# Patient Record
Sex: Female | Born: 1945 | Race: White | Hispanic: No | Marital: Married | Smoking: Never smoker
Health system: Southern US, Community
[De-identification: ages and names within clinical notes are randomized; demographics above are authoritative.]

## PROBLEM LIST (undated history)

## (undated) DIAGNOSIS — E039 Hypothyroidism, unspecified: Secondary | ICD-10-CM

## (undated) DIAGNOSIS — E785 Hyperlipidemia, unspecified: Secondary | ICD-10-CM

## (undated) DIAGNOSIS — Z860100 Personal history of colon polyps, unspecified: Secondary | ICD-10-CM

## (undated) DIAGNOSIS — G2581 Restless legs syndrome: Secondary | ICD-10-CM

## (undated) DIAGNOSIS — M1990 Unspecified osteoarthritis, unspecified site: Secondary | ICD-10-CM

## (undated) DIAGNOSIS — C541 Malignant neoplasm of endometrium: Secondary | ICD-10-CM

## (undated) DIAGNOSIS — J302 Other seasonal allergic rhinitis: Secondary | ICD-10-CM

## (undated) DIAGNOSIS — J189 Pneumonia, unspecified organism: Secondary | ICD-10-CM

## (undated) DIAGNOSIS — R42 Dizziness and giddiness: Secondary | ICD-10-CM

## (undated) DIAGNOSIS — G4733 Obstructive sleep apnea (adult) (pediatric): Secondary | ICD-10-CM

## (undated) DIAGNOSIS — K219 Gastro-esophageal reflux disease without esophagitis: Secondary | ICD-10-CM

## (undated) DIAGNOSIS — IMO0002 Reserved for concepts with insufficient information to code with codable children: Secondary | ICD-10-CM

## (undated) DIAGNOSIS — M549 Dorsalgia, unspecified: Secondary | ICD-10-CM

## (undated) DIAGNOSIS — M329 Systemic lupus erythematosus, unspecified: Secondary | ICD-10-CM

## (undated) DIAGNOSIS — K567 Ileus, unspecified: Secondary | ICD-10-CM

## (undated) DIAGNOSIS — R112 Nausea with vomiting, unspecified: Secondary | ICD-10-CM

## (undated) DIAGNOSIS — Z8619 Personal history of other infectious and parasitic diseases: Secondary | ICD-10-CM

## (undated) DIAGNOSIS — M2550 Pain in unspecified joint: Secondary | ICD-10-CM

## (undated) DIAGNOSIS — Z9889 Other specified postprocedural states: Secondary | ICD-10-CM

## (undated) DIAGNOSIS — M2540 Effusion, unspecified joint: Secondary | ICD-10-CM

## (undated) DIAGNOSIS — R51 Headache: Secondary | ICD-10-CM

## (undated) DIAGNOSIS — R351 Nocturia: Secondary | ICD-10-CM

## (undated) HISTORY — DX: Malignant neoplasm of endometrium: C54.1

## (undated) HISTORY — PX: DILATION AND CURETTAGE OF UTERUS: SHX78

## (undated) HISTORY — PX: OTHER SURGICAL HISTORY: SHX169

## (undated) HISTORY — PX: KNEE ARTHROPLASTY: SHX992

## (undated) HISTORY — PX: COLON SURGERY: SHX602

## (undated) HISTORY — PX: COLONOSCOPY: SHX174

## (undated) HISTORY — PX: BRAIN SURGERY: SHX531

## (undated) HISTORY — DX: Obstructive sleep apnea (adult) (pediatric): G47.33

## (undated) HISTORY — PX: ABDOMINAL HYSTERECTOMY: SHX81

---

## 1975-03-29 ENCOUNTER — Encounter: Payer: Self-pay | Admitting: Gastroenterology

## 1991-10-27 ENCOUNTER — Encounter: Payer: Self-pay | Admitting: Gastroenterology

## 1991-11-03 ENCOUNTER — Encounter: Payer: Self-pay | Admitting: Gastroenterology

## 1993-04-01 ENCOUNTER — Encounter: Payer: Self-pay | Admitting: Gastroenterology

## 1994-11-04 ENCOUNTER — Encounter: Payer: Self-pay | Admitting: Gastroenterology

## 1994-11-19 ENCOUNTER — Encounter: Payer: Self-pay | Admitting: Gastroenterology

## 2002-09-12 ENCOUNTER — Encounter (INDEPENDENT_AMBULATORY_CARE_PROVIDER_SITE_OTHER): Payer: Self-pay | Admitting: *Deleted

## 2002-09-12 ENCOUNTER — Ambulatory Visit (HOSPITAL_COMMUNITY): Admission: RE | Admit: 2002-09-12 | Discharge: 2002-09-12 | Payer: Self-pay | Admitting: Gastroenterology

## 2002-10-19 ENCOUNTER — Other Ambulatory Visit: Admission: RE | Admit: 2002-10-19 | Discharge: 2002-10-19 | Payer: Self-pay | Admitting: Obstetrics and Gynecology

## 2002-12-04 ENCOUNTER — Encounter: Payer: Self-pay | Admitting: Obstetrics and Gynecology

## 2002-12-04 ENCOUNTER — Ambulatory Visit (HOSPITAL_COMMUNITY): Admission: RE | Admit: 2002-12-04 | Discharge: 2002-12-04 | Payer: Self-pay | Admitting: Obstetrics and Gynecology

## 2003-10-19 ENCOUNTER — Ambulatory Visit (HOSPITAL_COMMUNITY): Admission: RE | Admit: 2003-10-19 | Discharge: 2003-10-19 | Payer: Self-pay | Admitting: Orthopedic Surgery

## 2003-10-19 ENCOUNTER — Ambulatory Visit (HOSPITAL_BASED_OUTPATIENT_CLINIC_OR_DEPARTMENT_OTHER): Admission: RE | Admit: 2003-10-19 | Discharge: 2003-10-19 | Payer: Self-pay | Admitting: Orthopedic Surgery

## 2003-10-23 ENCOUNTER — Other Ambulatory Visit: Admission: RE | Admit: 2003-10-23 | Discharge: 2003-10-23 | Payer: Self-pay | Admitting: Obstetrics and Gynecology

## 2003-12-01 HISTORY — PX: MEDIAL PARTIAL KNEE REPLACEMENT: SHX5965

## 2004-10-06 ENCOUNTER — Ambulatory Visit (HOSPITAL_COMMUNITY): Admission: RE | Admit: 2004-10-06 | Discharge: 2004-10-06 | Payer: Self-pay | Admitting: Obstetrics and Gynecology

## 2004-10-28 ENCOUNTER — Other Ambulatory Visit: Admission: RE | Admit: 2004-10-28 | Discharge: 2004-10-28 | Payer: Self-pay | Admitting: *Deleted

## 2004-11-30 HISTORY — PX: HAND SURGERY: SHX662

## 2005-12-14 ENCOUNTER — Encounter (INDEPENDENT_AMBULATORY_CARE_PROVIDER_SITE_OTHER): Payer: Self-pay | Admitting: *Deleted

## 2005-12-14 ENCOUNTER — Ambulatory Visit (HOSPITAL_BASED_OUTPATIENT_CLINIC_OR_DEPARTMENT_OTHER): Admission: RE | Admit: 2005-12-14 | Discharge: 2005-12-14 | Payer: Self-pay | Admitting: *Deleted

## 2007-04-27 ENCOUNTER — Encounter: Payer: Self-pay | Admitting: Gastroenterology

## 2007-11-25 ENCOUNTER — Ambulatory Visit (HOSPITAL_COMMUNITY): Admission: RE | Admit: 2007-11-25 | Discharge: 2007-11-25 | Payer: Self-pay | Admitting: Obstetrics and Gynecology

## 2008-12-18 ENCOUNTER — Ambulatory Visit (HOSPITAL_COMMUNITY): Admission: RE | Admit: 2008-12-18 | Discharge: 2008-12-18 | Payer: Self-pay | Admitting: Obstetrics and Gynecology

## 2010-02-05 ENCOUNTER — Encounter: Payer: Self-pay | Admitting: Gastroenterology

## 2010-02-17 ENCOUNTER — Encounter (INDEPENDENT_AMBULATORY_CARE_PROVIDER_SITE_OTHER): Payer: Self-pay | Admitting: *Deleted

## 2010-03-19 ENCOUNTER — Ambulatory Visit: Payer: Self-pay | Admitting: Gastroenterology

## 2010-03-19 DIAGNOSIS — G43909 Migraine, unspecified, not intractable, without status migrainosus: Secondary | ICD-10-CM | POA: Insufficient documentation

## 2010-03-19 DIAGNOSIS — K219 Gastro-esophageal reflux disease without esophagitis: Secondary | ICD-10-CM | POA: Insufficient documentation

## 2010-03-19 DIAGNOSIS — R159 Full incontinence of feces: Secondary | ICD-10-CM | POA: Insufficient documentation

## 2010-04-23 ENCOUNTER — Ambulatory Visit: Payer: Self-pay | Admitting: Gastroenterology

## 2010-04-30 ENCOUNTER — Ambulatory Visit (HOSPITAL_COMMUNITY): Admission: RE | Admit: 2010-04-30 | Discharge: 2010-04-30 | Payer: Self-pay | Admitting: Gastroenterology

## 2010-04-30 ENCOUNTER — Ambulatory Visit: Payer: Self-pay | Admitting: Gastroenterology

## 2010-05-02 ENCOUNTER — Telehealth: Payer: Self-pay | Admitting: Gastroenterology

## 2010-06-25 ENCOUNTER — Ambulatory Visit: Payer: Self-pay | Admitting: Gastroenterology

## 2010-09-11 ENCOUNTER — Ambulatory Visit: Payer: Self-pay | Admitting: Gastroenterology

## 2010-09-11 DIAGNOSIS — K648 Other hemorrhoids: Secondary | ICD-10-CM | POA: Insufficient documentation

## 2010-12-30 NOTE — Procedures (Signed)
Summary: Prep/[facility]  Prep/[facility]   Imported By: Lester Harrison 04/30/2010 09:43:44  _____________________________________________________________________  External Attachment:    Type:   Image     Comment:   External Document

## 2010-12-30 NOTE — Letter (Signed)
Summary: [facility]  [address] [place], [place] [address]   Phone: [phone]  Fax: [phone]       MALONIE TATUM    1946-05-26    MRN: [id number]        Procedure Day /Date: 04/30/10 Wednesday     Arrival Time: 7:45 am     Procedure Time: 9:00 am     Location of Procedure:                     _x [facility] ( Outpatient Registration)  PREPARATION FOR FLEXIBLE SIGMOIDOSCOPY WITH MAGNESIUM CITRATE  Prior to the day before your procedure, purchase one 8 oz. bottle of Magnesium Citrate and one Fleet Enema from the laxative section of your drugstore.  _________________________________________________________________________________________________  THE DAY BEFORE YOUR PROCEDURE             DATE: 04/29/10     DAY: Tuesday  1.   Have a clear liquid dinner the night before your procedure.  2.   Do not drink anything colored red or purple.  Avoid juices with pulp.  No orange juice.              CLEAR LIQUIDS INCLUDE: Water Jello Ice Popsicles Tea (sugar ok, no milk/cream) Powdered fruit flavored drinks Coffee (sugar ok, no milk/cream) Gatorade Juice: apple, white grape, white cranberry  Lemonade Clear bullion, consomm, broth Carbonated beverages (any kind) Strained chicken noodle soup Hard Candy   3.   At 7:00 pm the night before your procedure, drink one bottle of Magnesium Citrate over ice.  4.   Drink at least 3 more glasses of clear liquids before bedtime (preferably juices).  5.   Results are expected usually within 1 to 6 hours after taking the Magnesium Citrate.  ___________________________________________________________________________________________________  THE DAY OF YOUR PROCEDURE            DATE: 04/30/10     DAY: Wednesday  1.   Use Fleet Enema one hour prior to coming for procedure.  2.   You may drink clear liquids until 7:00 am (2 hours before exam)       MEDICATION  INSTRUCTIONS  Unless otherwise instructed, you should take regular prescription medications with a small sip of water as early as possible the morning of your procedure.        OTHER INSTRUCTIONS  You will need a responsible adult at least 63 years of age to accompany you and drive you home.   This person must remain in the waiting room during your procedure.  Wear loose fitting clothing that is easily removed.  Leave jewelry and other valuables at home.  However, you may wish to bring a book to read or an iPod/MP3 player to listen to music as you wait for your procedure to start.  Remove all body piercing jewelry and leave at home.  Total time from sign-in until discharge is approximately 2-3 hours.  You should go home directly after your procedure and rest.  You can resume normal activities the day after your procedure.  The day of your procedure you should not:   Drive   Make legal decisions   Operate machinery   Drink alcohol   Return to work  You will receive specific instructions about eating, activities and medications before you leave.   The above instructions have been reviewed and explained to me by  Lamona Curl CMA (AAMA)  Apr 23, 2010 10:01 AM     I fully understand and can verbalize these instructions _____________________________ Date 04/23/10

## 2010-12-30 NOTE — Assessment & Plan Note (Signed)
Summary: ? IBS--ch.   History of Present Illness Visit Type: Initial Visit Primary GI MD: Melvia Heaps MD [facility] Chief Complaint: Rectal Mass,and reflux issues History of Present Illness:   Brooke Williams is a pleasant 65 year old white female complaining of rectal discharge.  She tends to have loose stools.  She frequently has discharge of mucus for which he has to wear protective underclothing.  She denies rectal pain, per se.  There is no history of bleeding.  Colonoscopy in 2003 demonstrated internal hemorrhoids.  Family history is pertinent for her father who had polyps and Crohn's disease   GI Review of Systems    Reports acid reflux, bloating, heartburn, and  nausea.      Denies abdominal pain, belching, chest pain, dysphagia with liquids, dysphagia with solids, loss of appetite, vomiting, vomiting blood, weight loss, and  weight gain.      Reports diarrhea, fecal incontinence, and  irritable bowel syndrome.     Denies anal fissure, black tarry stools, change in bowel habit, constipation, diverticulosis, heme positive stool, hemorrhoids, jaundice, light color stool, liver problems, rectal bleeding, and  rectal pain. Preventive Screening-Counseling & Management      Drug Use:  no.      Current Medications (verified): 1)  Estradiol 1 Mg Tabs (Estradiol) .... Once Daily 2)  Prometrium 100 Mg Caps (Progesterone Micronized) .... Once Daily 3)  Robaxin 500 Mg Tabs (Methocarbamol) .... As Needed For Pain 4)  Naproxen 500 Mg Tabs (Naproxen) .... Two Times A Day 5)  Omeprazole 20 Mg Cpdr (Omeprazole) .... Once Daily 6)  Allegra-D 12 Hour 60-120 Mg Xr12h-Tab (Fexofenadine-Pseudoephedrine) .... As Needed 7)  Calcium-Vitamin D3 600-200 Mg-Unit Tabs (Calcium Carbonate-Vitamin D) .... Once Daily 8)  Multivitamins  Tabs (Multiple Vitamin) .... Once Daily  Allergies (verified): 1)  ! Penicillin  Past History:  Past Medical History: Arthritis Chronic Headaches LUPUS 1995  Past  Surgical History: Left knee replacement 2005  Family History: Family History of Colon Polyps:Father Family History of Colitis/Crohn's:Father Family History of Irritable Bowel Syndrome:Father  Social History: Married No Children Retired Never smoked Alcohol Use - yes  social Daily Caffeine Use Illicit Drug Use - no Drug Use:  no  Review of Systems       Brooke Williams complains of allergy/sinus, arthritis/joint pain, headaches-new, hearing problems, skin rash, and sore throat.  Brooke Williams denies anemia, anxiety-new, back pain, blood in urine, breast changes/lumps, change in vision, confusion, cough, coughing up blood, depression-new, fainting, fatigue, fever, heart murmur, heart rhythm changes, itching, menstrual pain, muscle pains/cramps, night sweats, nosebleeds, pregnancy symptoms, shortness of breath, sleeping problems, swelling of feet/legs, swollen lymph glands, thirst - excessive , urination - excessive , urination changes/pain, urine leakage, vision changes, and voice change.         All other systems were reviewed and were negative   Vital Signs:  Williams profile:   65 year old female Height:      67 inches Weight:      178 pounds BMI:     27.98 Pulse rate:   88 / minute Pulse rhythm:   regular BP sitting:   112 / 68  (left arm) Cuff size:   regular  Vitals Entered By: Ok Anis CMA (March 19, 2010 10:55 AM)  Physical Exam  Additional Exam:  On physical exam she is a well-developed lethargic female  skin: anicter  HEENT: normocephalic; PEERLA; no nasal or orpharyngeal abnormalities neck: supple nodes: no cervical adenopathy chest: clear cor:  no murmurs,  gallops or rubs abd:  bowel sounds normoactive; no abdominal masses, tenderness, organomegaly rectal: no masses; stool heme negative ext: no cyanosis, clubbing, or edema skeletal: no gross skeletal abnormalities neuro: alert, oriented x 3; no focal abnormalities    Impression &  Recommendations:  Problem # 1:  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  Proctitis is also consideration.  Recommendations #1 trial of Anusol HC suppositories; if not improved I will proceed with a sigmoidoscopy #2 fiber supplementation  Problem # 2:  ESOPHAGEAL REFLUX (ICD-530.81) Symptoms are well-controlled with omeprazole  Problem # 3:  MIGRAINE HEADACHE (ICD-346.90) Assessment: Comment Only  Williams Instructions: 1)  You can pick up your new rx from your pharmacy today 2)  Please call for a return office appointment in 2-3 weeks 3)  Brooke medication list was reviewed and reconciled.  All changed / newly prescribed medications were explained.  A complete medication list was provided to Brooke Williams / caregiver. Prescriptions: ANUSOL-HC 25 MG SUPP (HYDROCORTISONE ACETATE) take one suppository q.h.s.  #14 x 1   Entered and Authorized by:   Louis Meckel MD   Signed by:   Louis Meckel MD on 03/19/2010   Method used:   [facility]  [place] [address]       [address]       [place], [place]  [address]       Ph: [phone] or [phone]       Fax: [phone]   RxID:   [id number]

## 2010-12-30 NOTE — Letter (Signed)
Summary: Results Letter  [facility]  [address] [place], [place] [address]   Phone: [phone]  Fax: [phone]        March 19, 2010 MRN: [id number]    [facility] [address] [place], [place]  [address]    Dear [name],  It is my pleasure to have treated you recently as a new patient in my office. I appreciate your confidence and the opportunity to participate in your care.  Since I do have a busy inpatient endoscopy schedule and office schedule, my office hours vary weekly. I am, however, available for emergency calls everyday through my office. If I am not available for an urgent office appointment, another one of our gastroenterologist will be able to assist you.  My well-trained staff are prepared to help you at all times. For emergencies after office hours, a physician from our Gastroenterology section is always available through my 24 hour answering service  Once again I welcome you as a new patient and I look forward to a happy and healthy relationship             Sincerely,  Louis Meckel MD  This letter has been electronically signed by your physician.  Appended Document: Results Letter letter mailed

## 2010-12-30 NOTE — Assessment & Plan Note (Signed)
Summary: f/u from procedure--ch.   History of Present Illness Visit Type: Follow-up Visit Primary GI MD: Melvia Heaps MD [facility] Primary Provider: Marjory Lies, MD  Requesting Provider: na Chief Complaint: F/u from flex sig.  Pt c/o still having hemorrhoids  History of Present Illness:   [name] has returned for follow up of her stool incontinence.  She is actually incontinent of passage of mucus.  She is not incontinent of stool.  Band ligation of her hemorrhoids did not improve her symptoms.   She is unaware of passing mucus.  She wears a protective undergarment.  She claims her symptoms began when she was diagnosed with lupus in 1995.  Metamucil has helped.   GI Review of Systems      Denies abdominal pain, acid reflux, belching, bloating, chest pain, dysphagia with liquids, dysphagia with solids, heartburn, loss of appetite, nausea, vomiting, vomiting blood, weight loss, and  weight gain.      Reports hemorrhoids.     Denies anal fissure, black tarry stools, change in bowel habit, constipation, diarrhea, diverticulosis, fecal incontinence, heme positive stool, irritable bowel syndrome, jaundice, light color stool, liver problems, rectal bleeding, and  rectal pain.    Current Medications (verified): 1)  Estradiol 1 Mg Tabs (Estradiol) .... Once Daily 2)  Prometrium 100 Mg Caps (Progesterone Micronized) .... Once Daily 3)  Omeprazole 20 Mg Cpdr (Omeprazole) .... Once Daily 4)  Allegra-D 12 Hour 60-120 Mg Xr12h-Tab (Fexofenadine-Pseudoephedrine) .... As Needed 5)  Calcium-Vitamin D3 600-200 Mg-Unit Tabs (Calcium Carbonate-Vitamin D) .... Once Daily 6)  Multivitamins  Tabs (Multiple Vitamin) .... Once Daily  Allergies (verified): 1)  ! Penicillin  Past History:  Past Medical History: Reviewed history from 03/19/2010 and no changes required. Arthritis Chronic Headaches LUPUS 1995  Past Surgical History: Reviewed history from 03/19/2010 and no changes required. Left  knee replacement 2005  Family History: Reviewed history from 04/23/2010 and no changes required. Family History of Colon Polyps:Father Family History of Colitis/Crohn's:Father Family History of Irritable Bowel Syndrome:Father No FH of Colon Cancer:  Social History: Reviewed history from 03/19/2010 and no changes required. Married No Children Retired Never smoked Alcohol Use - yes  social Daily Caffeine Use Illicit Drug Use - no  Review of Systems  The patient denies allergy/sinus, anemia, anxiety-new, arthritis/joint pain, back pain, blood in urine, breast changes/lumps, change in vision, confusion, cough, coughing up blood, depression-new, fainting, fatigue, fever, headaches-new, hearing problems, heart murmur, heart rhythm changes, itching, menstrual pain, muscle pains/cramps, night sweats, nosebleeds, pregnancy symptoms, shortness of breath, skin rash, sleeping problems, sore throat, swelling of feet/legs, swollen lymph glands, thirst - excessive , urination - excessive , urination changes/pain, urine leakage, vision changes, and voice change.    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  Symptoms could be 2 to a neuropathy.  Colitis affecting the more proximal bowel is also a concern.  Recommendations #1 continue fiber supplementation #2 trial of anticholinergics (hyomax) #3  colonoscopy if symptoms do not improve the above measures #4 to consider anal manometry  Problem # 2:  SLE (ICD-710.0) Assessment: Comment Only  Patient  Instructions: 1)  Copy sent to : Marjory Lies, MD  2)  Call back next week 3)  The medication list was reviewed and reconciled.  All changed / newly prescribed medications were explained.  A complete medication list was provided to the patient / caregiver. Prescriptions: HYOMAX-SR 0.375 MG XR12H-TAB (HYOSCYAMINE SULFATE) take one tab twice a day  #25 x 2   Entered and Authorized by:   Louis Meckel MD   Signed by:   Louis Meckel MD on 06/25/2010   Method used:   [facility]  [place] [address] [facility]       [address]       [place], [place]  [address]       Ph: [phone] or [phone]       Fax: [phone]   RxID:   [id number]

## 2010-12-30 NOTE — Letter (Signed)
Summary: New Patient letter  [facility]  [address] [place], [place] [address]   Phone: [phone]  Fax: [phone]       02/17/2010 MRN: [id number]  Rolene Nyce [address] [place], [place]  [address]  Dear Ms. Westley Foots,  Welcome to the Gastroenterology Division at [facility].    You are scheduled to see Dr. Arlyce Dice on 03-19-10 at 10:45a.m.  on the 3rd floor at [facility], [address]  We ask that you try to arrive at our office 15 minutes prior to your appointment time to allow for check-in.  We would like you to complete the enclosed self-administered evaluation form prior to your visit and bring it with you on the day of your appointment.  We will review it with you.  Also, please bring a complete list of all your medications or, if you prefer, bring the medication bottles and we will list them.  Please bring your insurance card so that we may make a copy of it.  If your insurance requires a referral to see a specialist, please bring your referral form from your primary care physician.  Co-payments are due at the time of your visit and may be paid by cash, check or credit card.     Your office visit will consist of a consult with your physician (includes a physical exam), any laboratory testing he/she may order, scheduling of any necessary diagnostic testing (e.g. x-ray, ultrasound, CT-scan), and scheduling of a procedure (e.g. Endoscopy, Colonoscopy) if required.  Please allow enough time on your schedule to allow for any/all of these possibilities.    If you cannot keep your appointment, please call [phone] to cancel or reschedule prior to your appointment date.  This allows [place] the opportunity to schedule an appointment for another patient in need of care.  If you do not cancel or reschedule by 5 p.m. the business day prior to your appointment date, you will be charged a $50.00 late cancellation/no-show fee.    Thank you for  choosing  Gastroenterology for your medical needs.  We appreciate the opportunity to care for you.  Please visit [place] at our website  to learn more about our practice.                     Sincerely,                                                             The Gastroenterology Division

## 2010-12-30 NOTE — Procedures (Signed)
Summary: Flexible Sigmoidoscopy  Patient: Brooke Williams Note: All result statuses are Final unless otherwise noted.  Tests: (1) Flexible Sigmoidoscopy (FLX)  FLX Flexible Sigmoidoscopy                             DONE     [facility]     [address] [place], [place]  [address]           FLEXIBLE SIGMOIDOSCOPY PROCEDURE REPORT           PATIENT:  Brooke, Williams  MR#:  [id number]     BIRTHDATE:  [birth year], 63 yrs. old  GENDER:  female           ENDOSCOPIST:  Barbette Hair. Arlyce Dice, MD     Referred by:           PROCEDURE DATE:  04/30/2010     PROCEDURE:  Flexible Sigmoidoscopy with banding     ASA CLASS:  Class II     INDICATIONS:  fecal incontinence           MEDICATIONS:   Fentanyl 75 mcg IV, Versed 4 mg IV           DESCRIPTION OF PROCEDURE:   After the risks benefits and     alternatives of the procedure were thoroughly explained, informed     consent was obtained.  Digital rectal exam was performed and     revealed no abnormalities.   The [id number] ([id number]) endoscope was     introduced through the anus and advanced to the descending colon,     without limitations.  The quality of the prep was .  The     instrument was then slowly withdrawn as the mucosa was fully     examined.           Internal hemorrhoids were found. Small internal hemorrhoids     hemorrhoidal banding 5 bands were placed circumferentially above     the dentate line  The examination was otherwise normal.     Retroflexed views in the rectum revealed no abnormalities.    The     scope was then withdrawn from the patient and the procedure     terminated.           COMPLICATIONS:  None           ENDOSCOPIC IMPRESSION:     1) Internal hemorrhoids - s/p band ligation     2) Otherwise normal examination.     RECOMMENDATIONS:     1) Call the office to schedule a followup office visit for     _1_month           REPEAT EXAM:  No           ______________________________     Barbette Hair.  Arlyce Dice, MD           CC:           n.     eSIGNED:   Barbette Hair. Kaplan at 04/30/2010 09:18 AM           Marily Lente, [id number]  Note: An exclamation mark (!) indicates a result that was not dispersed into the flowsheet. Document Creation Date: 04/30/2010 9:19 AM _______________________________________________________________________  (1) Order result status: Final Collection or observation date-time: 04/30/2010 09:13 Requested date-time:  Receipt date-time:  Reported date-time:  Referring Physician:   Ordering Physician: Melvia Heaps [phone]) Specimen  Source:  Source: Launa Grill Order Number: [id number] Lab site:

## 2010-12-30 NOTE — Progress Notes (Signed)
Summary: TRIAGE  Phone Note Call from Patient Call back at Home Phone [phone] Call back at [phone]  (cell)   Caller: Patient Call For: Dr. Arlyce Dice Reason for Call: Talk to Nurse Summary of Call: procedure Wednesday morning and has been having some rectal bleeding since Initial call taken by: Vallarie Mare,  May 02, 2010 10:32 AM  Follow-up for Phone Call        Pt. had a Flex/Banding on 04-30-10. Has seen BRB on the tissue after a BM and she sees blood on the pad she is wearing, "It is almost like a very light period"   1) Sitz baths three times a day 2) Avoid Constipation 3) If symptoms become worse call back immediately or go to ER. 4) I will call pt., if new orders, after MD reviews.  [facility] PLEASE ADVISE  Follow-up by: Laureen Ochs LPN,  May 02, 9628 11:14 AM  Additional Follow-up for Phone Call Additional follow up Details #1::        not to worry about a small amount of bleeding Additional Follow-up by: Louis Meckel MD,  May 02, 2010 11:46 AM

## 2010-12-30 NOTE — Letter (Signed)
Summary: Dermatology/[facility]  Dermatology/[facility]   Imported By: Lester [place] 03/24/2010 10:19:01  _____________________________________________________________________  External Attachment:    Type:   Image     Comment:   External Document

## 2010-12-30 NOTE — Assessment & Plan Note (Signed)
Summary: F/U APPT...LSW.   History of Present Illness Visit Type: Follow-up Visit Primary GI MD: Melvia Heaps MD [facility] Requesting Provider: na Chief Complaint: Fecal Incontinence History of Present Illness:   Brooke Williams has returned for followup of her fecal incontinence.  Despite using Anusol suppositories she continues to leak mucus.  Stools have become more firm with Metamucil.   GI Review of Systems      Denies abdominal pain, acid reflux, belching, bloating, chest pain, dysphagia with liquids, dysphagia with solids, heartburn, loss of appetite, nausea, vomiting, vomiting blood, weight loss, and  weight gain.      Reports fecal incontinence.     Denies anal fissure, black tarry stools, change in bowel habit, constipation, diarrhea, diverticulosis, heme positive stool, hemorrhoids, irritable bowel syndrome, jaundice, light color stool, liver problems, rectal bleeding, and  rectal pain.    Current Medications (verified): 1)  Estradiol 1 Mg Tabs (Estradiol) .... Once Daily 2)  Prometrium 100 Mg Caps (Progesterone Micronized) .... Once Daily 3)  Robaxin 500 Mg Tabs (Methocarbamol) .... As Needed For Pain 4)  Naproxen 500 Mg Tabs (Naproxen) .... Two Times A Day 5)  Omeprazole 20 Mg Cpdr (Omeprazole) .... Once Daily 6)  Allegra-D 12 Hour 60-120 Mg Xr12h-Tab (Fexofenadine-Pseudoephedrine) .... As Needed 7)  Calcium-Vitamin D3 600-200 Mg-Unit Tabs (Calcium Carbonate-Vitamin D) .... Once Daily 8)  Multivitamins  Tabs (Multiple Vitamin) .... Once Daily  Allergies (verified): 1)  ! Penicillin  Past History:  Past Medical History: Reviewed history from 03/19/2010 and no changes required. Arthritis Chronic Headaches LUPUS 1995  Past Surgical History: Reviewed history from 03/19/2010 and no changes required. Left knee replacement 2005  Family History: Family History of Colon Polyps:Father Family History of Colitis/Crohn's:Father Family History of Irritable Bowel  Syndrome:Father No FH of Colon Cancer:  Social History: Reviewed history from 03/19/2010 and no changes required. Married No Children Retired Never smoked Alcohol Use - yes  social Daily Caffeine Use Illicit Drug Use - no  Review of Systems  The patient denies allergy/sinus, anemia, anxiety-new, arthritis/joint pain, back pain, blood in urine, breast changes/lumps, change in vision, confusion, cough, coughing up blood, depression-new, fainting, fatigue, fever, headaches-new, hearing problems, heart murmur, heart rhythm changes, itching, menstrual pain, muscle pains/cramps, night sweats, nosebleeds, pregnancy symptoms, shortness of breath, skin rash, sleeping problems, sore throat, swelling of feet/legs, swollen lymph glands, thirst - excessive , urination - excessive , urination changes/pain, urine leakage, vision changes, and voice change.    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  2)  Hemorrhoids brochure given.  3)  You have been scheduled for a flexible sigmoidoscopy with hemorrhoidal banding at [facility] on 04/30/10. 4)  The medication list was reviewed and reconciled.  All changed / newly prescribed medications were explained.  A complete medication list was provided to the patient / caregiver.

## 2010-12-30 NOTE — Procedures (Signed)
Summary: Colonoscopy & Pathology -- Dr. Randa Evens   Colonoscopy  Procedure date:  09/11/2002  Findings:      Location:  [facility].   NAME:  Brooke Williams, Brooke Williams                        ACCOUNT NO.:  [id number]   MEDICAL RECORD NO.:  [id number]                   PATIENT TYPE:  AMB   LOCATION:  ENDO                                 FACILITY:  [facility]   PHYSICIAN:  James L. Malon Kindle., M.D.          DATE OF BIRTH:  1946-11-25   DATE OF PROCEDURE:  DATE OF DISCHARGE:                                 OPERATIVE REPORT   PROCEDURE PERFORMED:  Colonoscopy and biopsy.   ENDOSCOPIST:  Llana Aliment. Edwards, M.D.   MEDICATIONS:  Fentanyl 70 mcg and Versed 7 mg IV.   INDICATIONS FOR PROCEDURE:  The patient with rectal bleeding and diarrhea,  and has a clinical history of celiac disease that has never been biopsied.  Has been on a glutin free diet that she has been on for a number of years.  She has had rectal bleeding and concerned over a colitis, etc, and this is  why a colonoscopy is performed.   DESCRIPTION OF PROCEDURE:  The procedure had been explained to the patient  and consent obtained.   With the patient in the left lateral decubitus position Olympus pediatric  video colonoscope inserted and advanced under direct visualization.  Prep  quite good.  We were able to reach the cecum.  Entered the ileocecal  valve  for approximately 10 cm.  The terminal ileum was endoscopically normal.  Biopsies were taken to look for celiac disease and Crohn's.  The scope was  withdrawn and the mucosa throughout the entire colon, ascending, transverse,  descending and sigmoid colon was endoscopically normal.  Multiple random  biopsies were taken to look for evidence of microscopic colitis.  There were  internal hemorrhoids seen in the rectum.  They were not actively bleeding.   The scope was withdrawn.   The patient tolerated the procedure well.   ASSESSMENT:  1. Internal hemorrhoids, probably  the source of her rectal bleeding.  2. Normal mucosa with no gross evidence of Crohn's or ulcerative colitis.   PLAN:  1. Will check path, and;  2. See back in the office in two months.                                                James L. Malon Kindle., M.D.   Waldron Session  D:  09/12/2002  T:  09/12/2002  Job:  [id number]   cc:   Marjory Lies, MD   [facility] Surgical Pathology - STATUS: Final             By: Almyra Free MD , Malcolm Metro        Perform Date: 14Oct03 00:00  Ordered By:  Mckinley Jewel , Genene Churn,        Ordered Date:  Facility: [facility]                              Department: CPATH  Service Report Text  [facility]   [address]   [place], [place] [address]   [phone]    REPORT OF SURGICAL PATHOLOGY    Case #: [id number]   Patient Name: Brooke Williams, Brooke Williams   PID: [id number]   Pathologist: Cira Rue L. Almyra Free, MD   DOB/Age [birth year] (Age: 65) Gender: F   Date Taken: 09/12/2002   Date Received: 09/12/2002    FINAL DIAGNOSIS    ***MICROSCOPIC EXAMINATION AND DIAGNOSIS***    1. ENDOSCOPIC BIOPSY, TERMINAL ILEUM: SMALL BOWEL MUCOSA WITH A   NORMAL TO DECREASED INFLAMMATORY CELL COMPONENT AND WELL FORMED   VILLI. SEE COMMENT.    2. BIOPSY RANDOM COLON: FRAGMENTS OF COLONIC MUCOSA WITH A   SINGLE LYMPHOID AGGREGATE. NO EVIDENCE OF MICROSCOPIC OR ACTIVE   COLITIS.    COMMENT   1. There is no evidence of celiac sprue. (ALL:jy) 09/13/02    jy   Date Reported: 09/13/2002 Arlene L. Almyra Free, MD   *** Electronically Signed Out By ALL ***    Clinical information   Diarrhea, blood, mucous stools R/O colitis ? celiac disease (cf)    specimen(s) obtained   1: Ileum, biopsy, terminal   2: Colon, biopsy, random    Gross Description   1. Received in formalin are tan, soft tissue fragments that are   submitted in toto. Number: 2   Size: each 0.3 cm toto in cassette    2. Received in formalin are tan, soft tissue fragments that are   submitted in toto.  Number: 5   Size: 0.2 to 0.3 cm toto in cassette (BM:jes,   09/13/02)    jes/

## 2010-12-30 NOTE — Letter (Signed)
[facility]   Imported By: Lester [place] 03/24/2010 10:34:59  _____________________________________________________________________  External Attachment:    Type:   Image     Comment:   External Document

## 2010-12-30 NOTE — Assessment & Plan Note (Signed)
Summary: hemorroids--ch.   History of Present Illness Visit Type: Follow-up Visit Primary GI MD: Melvia Heaps MD [facility] Primary Provider: Marjory Lies, MD  Requesting Provider: na Chief Complaint: hemorrhoids not  any better  larger with itching and burning  History of Present Illness:   Brooke Williams has returned for reevaluation of hemorrhoids.  In June, 2011 she underwent band ligation of hemorrhoids.  She had been having rectal discharge including mucous which was thought to be a manifestation of her hemorrhoidal disease.  Unfortunately, since band ligation her symptoms of discharge have not improved.  She now has rectal itching and discomfort whereas before she did not have these complaints.   GI Review of Systems      Denies abdominal pain, acid reflux, belching, bloating, chest pain, dysphagia with liquids, dysphagia with solids, heartburn, loss of appetite, nausea, vomiting, vomiting blood, weight loss, and  weight gain.      Reports hemorrhoids, rectal bleeding, and  rectal pain.     Denies anal fissure, black tarry stools, change in bowel habit, constipation, diarrhea, diverticulosis, fecal incontinence, heme positive stool, irritable bowel syndrome, jaundice, light color stool, and  liver problems.    Current Medications (verified): 1)  Estradiol 1 Mg Tabs (Estradiol) .... Once Daily 2)  Prometrium 100 Mg Caps (Progesterone Micronized) .... Once Daily 3)  Omeprazole 20 Mg Cpdr (Omeprazole) .... Once Daily 4)  Allegra-D 12 Hour 60-120 Mg Xr12h-Tab (Fexofenadine-Pseudoephedrine) .... As Needed 5)  Calcium-Vitamin D3 600-200 Mg-Unit Tabs (Calcium Carbonate-Vitamin D) .... Once Daily 6)  Multivitamins  Tabs (Multiple Vitamin) .... Once Daily 7)  Hyomax-Sr 0.375 Mg Xr12h-Tab (Hyoscyamine Sulfate) .... Take One Tab Twice A Day  Allergies (verified): 1)  ! Penicillin  Past History:  Past Medical History: Arthritis Chronic Headaches LUPUS 1995 Hemorrhoids  Past Surgical  History: Left knee replacement 2005 Banding of Hemorrhoids  Family History: Reviewed history from 04/23/2010 and no changes required. Family History of Colon Polyps:Father Family History of Colitis/Crohn's:Father Family History of Irritable Bowel Syndrome:Father No FH of Colon Cancer:  Social History: Reviewed history from 03/19/2010 and no changes required. 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  Band ligation clearly was ineffective.  Recommendations #1 Anusol-HC suppositories #2 surgery referral for consideration of further therapy including hemorrhoidectomy #3 I will reevaluate the patient for her incontinence following further therapy  Other Orders: [facility] ([facility])  Patient Instructions: 1)  CC Dr. Dwain Sarna 2)  Scheduled appointment with [facility] for Friday, November 18 at 1:40 pm.  3)  The medication list was reviewed and reconciled.  All changed / newly prescribed medications were explained.  A complete medication list was provided to the patient / caregiver. Prescriptions: ANUSOL-HC 25 MG  SUPP (HYDROCORTISONE ACETATE) take one suppository at bedtime  #10 x 2   Entered and Authorized by:   Louis Meckel MD   Signed by:   Louis Meckel MD on 09/11/2010   Method used:    Electronically to        [facility]  [place] [address] (retail)       [address]       [place], [place]  [address]       Ph: [phone] or [phone]       Fax: [phone]   RxID:   [id number]

## 2011-02-09 ENCOUNTER — Other Ambulatory Visit: Payer: Self-pay | Admitting: Gastroenterology

## 2011-02-16 ENCOUNTER — Telehealth: Payer: Self-pay | Admitting: Gastroenterology

## 2011-02-26 NOTE — Progress Notes (Signed)
Summary: pt cancelled [facility]  Phone Note Outgoing Call Call back at [facility]   Summary of Call: Called to check to see if pt ever went for her referral to Dr Kandis Mannan..Pt cancelled and never rescheduled Initial call taken by: Merri Ray CMA Duncan Dull),  February 16, 2011 9:44 AM

## 2011-03-20 ENCOUNTER — Other Ambulatory Visit (HOSPITAL_COMMUNITY): Payer: Self-pay | Admitting: Obstetrics and Gynecology

## 2011-03-20 DIAGNOSIS — Z1231 Encounter for screening mammogram for malignant neoplasm of breast: Secondary | ICD-10-CM

## 2011-03-31 ENCOUNTER — Ambulatory Visit (HOSPITAL_COMMUNITY)
Admission: RE | Admit: 2011-03-31 | Discharge: 2011-03-31 | Disposition: A | Discharge status: Home / Self Care | Payer: BC Managed Care – PPO | Source: Ambulatory Visit | Attending: Obstetrics and Gynecology | Admitting: Obstetrics and Gynecology

## 2011-03-31 DIAGNOSIS — Z1231 Encounter for screening mammogram for malignant neoplasm of breast: Secondary | ICD-10-CM

## 2011-04-17 NOTE — Op Note (Signed)
NAMEALDENA, WORM            ACCOUNT NO.:  [id number]   MEDICAL RECORD NO.:  [id number]          PATIENT TYPE:  AMB   LOCATION:  [facility]                         FACILITY:  [facility]   PHYSICIAN:  Pershing Cox, M.D.DATE OF BIRTH:  Nov 22, 1946   DATE OF PROCEDURE:  12/16/2005  DATE OF DISCHARGE:                                 OPERATIVE REPORT   PREOPERATIVE DIAGNOSES:  1.  Postmenopausal bleeding.  2.  Endometrial polyp.   POSTOPERATIVE DIAGNOSES:  1.  Postmenopausal bleeding.  2.  Multiple endometrial polyps.   PROCEDURE:  Exam under anesthesia, fractional dilation and curettage,  hysteroscopy, with multiple polyp resection.   ANESTHESIA:  General by LMA and Marcaine local.   ASSISTANT:  None.   INDICATIONS FOR PROCEDURE:  The patient is 65 years old. She began to have  postmenopausal bleeding about two a month ago and was referred for  evaluation. On sonogram, she has thickened endometrium and hydrosonogram  showed evidence of endometrial polyp. She is brought the operating room  today for removal of this polyp.   OPERATIVE FINDINGS:  The patient's uterus is anteflexed and small cavity  sounded to a depth of 8.5 cm. There was marked cervical stenosis and there  were multiple endometrial polyps. There was one arising in the lower uterine  segment and once this had been resected and I was able to put the scope  deeper into the endometrial cavity, we were able to see multiple polyps  arising from the fundus. None of these were abnormal in appearance.   PROCEDURE IN DETAIL:  Ms. Mikel was brought to the operating room with  an IV in place. She received a gram of Ancef in the holding area. Supine on  the OR table, IV sedation was administered and then an LMA was placed for  general anesthesia. The patient was placed into Allen stirrups and using  Betadine, the vagina, perineum, upper thighs, and lower abdomen were  prepped. A Red Rubber catheter was used to sterilely  empty the bladder.  Sterile drapes were then used to drape the legs and abdomen. This drape gave  good visualization of the perineum. An under the buttocks drape had been  placed prior to placing the sterile drapes and this was used to collect the  effluent during the procedure so that we could have a careful in-and-out for  the patient.   Bivalve speculum was inserted into the vagina which is slightly stenotic.  Cervix was grasped with a single-toothed tenaculum and a Kevorkian curette  was then used to obtain endocervical curettings onto Telfa. Using serial  Pratt dilators the cervix was dilated. Once I reached  #27 dilator, the  dilatation was much more difficult. I placed a second single-tooth tenaculum  on the posterior cervix and then was able to use traction both the anterior  and posteriorly to dilate further to size 33 Pratt dilator. The resectoscope  was then introduced into the cavity and photographs were taken of the  presenting polyp. Later in the case when this had been removed and we were  able to visualize the fundus, more photographs were  taken at the end of  procedure, photograph was also taken.   Using the resectoscope with a single loop wire set on 110/110 blend blunt  cautery, the polyps were resected. Through-and-through sorbitol irrigation  was used throughout the procedure to distend the endometrial cavity. We  never had to go over pressure of 70. As I began to try to resect the first  polyp, the visualization was so poor that I removed the hysteroscope and  using Randall stone forceps, the more distal polyp was retrieved. The  resectoscope was then able to be re-introduced and when I saw multiple  polyps, we resected as many portions as I could with the limited  visualization and then small ring forceps was used to help retrieve the  polyp fragments as well as some of the polyps there were not deeply  attached. Visualization of the cavity showed only one residual  polyp tag and  this was removed by resection directly.   At this point the cavity was photographed and a Telfa was used to collect  very scant endometrial curettings first with a #2 sharp curette and then  with Meig's curette.  All of these specimens were collected onto Telfa. The  resected specimen on Telfa was also submitted with this as one specimen.  The patient tolerated the procedure well. She was taken to recovery room in  excellent condition. Our fluid deficit was 75 mL.      Pershing Cox, M.D.  Electronically Signed     MAJ/MEDQ  D:  12/14/2005  T:  12/15/2005  Job:  [id number]

## 2011-04-17 NOTE — Op Note (Signed)
   NAME:  Brooke Williams, Brooke Williams                        ACCOUNT NO.:  [id number]   MEDICAL RECORD NO.:  [id number]                   PATIENT TYPE:  AMB   LOCATION:  ENDO                                 FACILITY:  [facility]   PHYSICIAN:  James L. Malon Kindle., M.D.          DATE OF BIRTH:  05-Dec-1945   DATE OF PROCEDURE:  DATE OF DISCHARGE:                                 OPERATIVE REPORT   PROCEDURE PERFORMED:  Colonoscopy and biopsy.   ENDOSCOPIST:  Llana Aliment. Edwards, M.D.   MEDICATIONS:  Fentanyl 70 mcg and Versed 7 mg IV.   INDICATIONS FOR PROCEDURE:  The patient with rectal bleeding and diarrhea,  and has a clinical history of celiac disease that has never been biopsied.  Has been on a glutin free diet that she has been on for a number of years.  She has had rectal bleeding and concerned over a colitis, etc, and this is  why a colonoscopy is performed.   DESCRIPTION OF PROCEDURE:  The procedure had been explained to the patient  and consent obtained.   With the patient in the left lateral decubitus position Olympus pediatric  video colonoscope inserted and advanced under direct visualization.  Prep  quite good.  We were able to reach the cecum.  Entered the ileocecal  valve  for approximately 10 cm.  The terminal ileum was endoscopically normal.  Biopsies were taken to look for celiac disease and Crohn's.  The scope was  withdrawn and the mucosa throughout the entire colon, ascending, transverse,  descending and sigmoid colon was endoscopically normal.  Multiple random  biopsies were taken to look for evidence of microscopic colitis.  There were  internal hemorrhoids seen in the rectum.  They were not actively bleeding.   The scope was withdrawn.   The patient tolerated the procedure well.   ASSESSMENT:  1. Internal hemorrhoids, probably the source of her rectal bleeding.  2. Normal mucosa with no gross evidence of Crohn's or ulcerative colitis.   PLAN:  1. Will check path, and;  2. See back in the office in two months.                                                James L. Malon Kindle., M.D.   Waldron Session  D:  09/12/2002  T:  09/12/2002  Job:  [id number]   cc:   Marjory Lies, MD

## 2011-04-17 NOTE — Op Note (Signed)
NAME:  JO-ANNE, KLUTH                        ACCOUNT NO.:  [id number]   MEDICAL RECORD NO.:  [id number]                   PATIENT TYPE:  AMB   LOCATION:  [facility]                                  FACILITY:  [facility]   PHYSICIAN:  Harvie Junior, M.D.                DATE OF BIRTH:  [birth year]   DATE OF PROCEDURE:  10/19/2003  DATE OF DISCHARGE:                                 OPERATIVE REPORT   PREOPERATIVE DIAGNOSIS:  Medial meniscal tear.   POSTOPERATIVE DIAGNOSES:  1. Medial meniscal tear.  2. A large osteophyte centrally off the tibial spine.   PROCEDURE:  1. Partial medial meniscectomy with corresponding debridement of medial     femoral condyle.  2. Burring of a central osteophyte in the notch.   SURGEON:  Harvie Junior, M.D.   ASSISTANT:  Marshia Ly, P.A.   ANESTHESIA:  General.   INDICATIONS FOR PROCEDURE:  Ms. Patricelli is a 65 year old female with a  long history of having significant knee pain. She ultimately had been  evaluated and treated conservatively. She continued to have pain, and  because of such an MRI was obtained which showed that she had a medial  meniscal tear and significant medial degenerative change. She is brought to  the operating room for evaluation  under anesthesia and arthroscopy.   DESCRIPTION OF PROCEDURE:  The patient was brought to the operating room and  after adequate anesthesia was obtained with general anesthetic, the patient  was placed on the operating table. The left leg was then prepped and draped  in the usual sterile fashion.   Following  this routine arthroscopic examination  of the knee revealed  that  there was an obvious mid body medial meniscal tear which curved around to  the posterior  horn. This was debrided with a suction shaver back to a  smooth and stable rim.   Attention was turned to the medial femoral condyle, where there was a large,  2 x 3 cm squared area of sclerotic bone exposed. There was a transitional  zone which was debrided on the tibia. There was about a 2 x 2 cm area of  exposed bone which was debrided.   Following  this attention was turned towards the central portion of the  knee, where there was a large notch osteophyte. This was impinging the knee  from coming out into full extension. At this point a motorized bur was  opened and the bur was used to debride the osteophyte down to the level of  the tibial plateau.   Attention was turned over laterally where the lateral  compartment showed no  significant abnormality. The patellofemoral joint showed no significant  abnormality.   At this point the knee was copiously irrigated and suctioned dry. The  arthroscopic portals were closed with a bandage. A sterile compressive  dressing  was applied. The patient was taken to the recovery room  where she  was noted to be in satisfactory condition. Estimated blood loss was for the  procedure was none.                                               Harvie Junior, M.D.    Ranae Plumber  D:  10/19/2003  T:  10/20/2003  Job:  [id number]

## 2012-04-05 ENCOUNTER — Other Ambulatory Visit (HOSPITAL_COMMUNITY): Payer: Self-pay | Admitting: Obstetrics and Gynecology

## 2012-04-05 DIAGNOSIS — Z1231 Encounter for screening mammogram for malignant neoplasm of breast: Secondary | ICD-10-CM

## 2012-04-29 ENCOUNTER — Other Ambulatory Visit (HOSPITAL_COMMUNITY): Payer: Self-pay | Admitting: Obstetrics and Gynecology

## 2012-04-29 DIAGNOSIS — M8580 Other specified disorders of bone density and structure, unspecified site: Secondary | ICD-10-CM

## 2012-05-03 ENCOUNTER — Ambulatory Visit (HOSPITAL_COMMUNITY)
Admission: RE | Admit: 2012-05-03 | Discharge: 2012-05-03 | Disposition: A | Discharge status: Home / Self Care | Payer: BC Managed Care – PPO | Source: Ambulatory Visit | Attending: Obstetrics and Gynecology | Admitting: Obstetrics and Gynecology

## 2012-05-03 DIAGNOSIS — Z1231 Encounter for screening mammogram for malignant neoplasm of breast: Secondary | ICD-10-CM | POA: Insufficient documentation

## 2012-05-03 DIAGNOSIS — M8580 Other specified disorders of bone density and structure, unspecified site: Secondary | ICD-10-CM

## 2012-10-11 ENCOUNTER — Other Ambulatory Visit: Payer: Self-pay | Admitting: Neurology

## 2012-10-11 DIAGNOSIS — R519 Headache, unspecified: Secondary | ICD-10-CM

## 2012-10-11 DIAGNOSIS — R200 Anesthesia of skin: Secondary | ICD-10-CM

## 2012-10-16 ENCOUNTER — Inpatient Hospital Stay (HOSPITAL_COMMUNITY): Payer: BC Managed Care – PPO

## 2012-10-16 ENCOUNTER — Encounter (HOSPITAL_COMMUNITY): Payer: Self-pay | Admitting: Obstetrics and Gynecology

## 2012-10-16 ENCOUNTER — Inpatient Hospital Stay (HOSPITAL_COMMUNITY)
Admission: AD | Admit: 2012-10-16 | Discharge: 2012-10-16 | Disposition: A | Discharge status: Home / Self Care | Payer: BC Managed Care – PPO | Source: Ambulatory Visit | Attending: Obstetrics & Gynecology | Admitting: Obstetrics & Gynecology

## 2012-10-16 DIAGNOSIS — N950 Postmenopausal bleeding: Secondary | ICD-10-CM | POA: Insufficient documentation

## 2012-10-16 DIAGNOSIS — R109 Unspecified abdominal pain: Secondary | ICD-10-CM | POA: Insufficient documentation

## 2012-10-16 HISTORY — DX: Reserved for concepts with insufficient information to code with codable children: IMO0002

## 2012-10-16 HISTORY — DX: Systemic lupus erythematosus, unspecified: M32.9

## 2012-10-16 HISTORY — DX: Unspecified osteoarthritis, unspecified site: M19.90

## 2012-10-16 LAB — COMPREHENSIVE METABOLIC PANEL
ALT: 38 U/L — ABNORMAL HIGH (ref 0–35)
AST: 28 U/L (ref 0–37)
Albumin: 3.9 g/dL (ref 3.5–5.2)
Alkaline Phosphatase: 83 U/L (ref 39–117)
BUN: 21 mg/dL (ref 6–23)
CO2: 26 mEq/L (ref 19–32)
Calcium: 9.3 mg/dL (ref 8.4–10.5)
Chloride: 107 mEq/L (ref 96–112)
Creatinine, Ser: 0.73 mg/dL (ref 0.50–1.10)
GFR calc Af Amer: >90 mL/min (ref >90)
GFR calc non Af Amer: 88 mL/min — ABNORMAL LOW (ref >90)
Glucose, Bld: 86 mg/dL (ref 70–99)
Potassium: 3.9 mEq/L (ref 3.5–5.1)
Sodium: 142 mEq/L (ref 135–145)
Total Bilirubin: 0.2 mg/dL — ABNORMAL LOW (ref 0.3–1.2)
Total Protein: 7.2 g/dL (ref 6.0–8.3)

## 2012-10-16 LAB — URINE MICROSCOPIC-ADD ON

## 2012-10-16 LAB — URINALYSIS, ROUTINE W REFLEX MICROSCOPIC
Bilirubin Urine: NEGATIVE
Glucose, UA: NEGATIVE mg/dL
Ketones, ur: NEGATIVE mg/dL
Leukocytes, UA: NEGATIVE
Nitrite: NEGATIVE
Protein, ur: 30 mg/dL — AB
Specific Gravity, Urine: 1.02 (ref 1.005–1.030)
Urobilinogen, UA: 0.2 mg/dL (ref 0.0–1.0)
pH: 6 (ref 5.0–8.0)

## 2012-10-16 LAB — WET PREP, GENITAL
Clue Cells Wet Prep HPF POC: NONE SEEN
Trich, Wet Prep: NONE SEEN
Yeast Wet Prep HPF POC: NONE SEEN

## 2012-10-16 LAB — CBC
HCT: 41.5 % (ref 36.0–46.0)
Hemoglobin: 14.2 g/dL (ref 12.0–15.0)
MCH: 31.6 pg (ref 26.0–34.0)
MCHC: 34.2 g/dL (ref 30.0–36.0)
MCV: 92.2 fL (ref 78.0–100.0)
Platelets: 208 10*3/uL (ref 150–400)
RBC: 4.5 MIL/uL (ref 3.87–5.11)
RDW: 13.5 % (ref 11.5–15.5)
WBC: 7.4 10*3/uL (ref 4.0–10.5)

## 2012-10-16 LAB — PROTIME-INR
INR: 0.9 (ref 0.00–1.49)
Prothrombin Time: 12.1 seconds (ref 11.6–15.2)

## 2012-10-16 NOTE — MAU Note (Signed)
Pt has been in menopause for 14 years . Started spotting yesterday. Bleeding is much heavier today and reports some abd cramping.

## 2012-10-16 NOTE — MAU Provider Note (Signed)
History   Ms. Brooke Williams is a 66y/o postmenopausal women presenting with a history of lower abdominal cramping for past 12 - 14 hours and at 12 noon today had heavy PV bleeding x 1 episode. Patient has been post menopausal x 14 years and has been usingPprometrium 100mg  daily and Minivelle patches twice weekly Hx of endometrial polyp 2007 - benign Pap: 01/28/2010 - normal Hx of Yeast infection sept 2013 - Tx Diflucan 150 mg po x 1 Hx of SLE and now in remission for 6 years and off medications Hx of Migraine headaches: Neurologist has started her on Verapramil 120 mg CR daily - x 2 weeks ago. Now only taking every alternate day due to side effect of dry mouth and mucosa.  CSN: [id number]  Arrival date and time: 10/16/12 1311   First Provider Initiated Contact with Patient 10/16/12 1506      Chief Complaint  Patient presents with  . Vaginal Bleeding   HPI  Pertinent Gynecological History: Menses: heavy flow of PV beeling today x 1 episode at 16 yrs post menopausal. Bleeding: post menopausal bleeding Contraception: none DES exposure: denies Blood transfusions: none Sexually transmitted diseases: no past history Previous GYN Procedures: Endometrial polyp r/o and D&C - benign findings - 2007  Last mammogram: normal Date:03/31/11 Last pap: normal Date: 2011   Past Medical History  Diagnosis Date  . Lupus     in remission for 5-6 yrs, not on meds now.  . Arthritis     Past Surgical History  Procedure Date  . Medial partial knee replacement     LT knee  . Other surgical history     metal pin/screw to repair arthritis in LT thumb joint    Family History  Problem Relation Age of Onset  . Endometriosis Mother   . Stroke Mother 49    arterial wall of artery in eye   . Dementia Father   . Stroke Father     "lots of many strokes"  . Aneurysm Father   . Multiple births Maternal Grandmother     died 5 days after delivery of unknown causes    History  Substance Use Topics    . Smoking status: Never Smoker   . Smokeless tobacco: Not on file  . Alcohol Use: Yes     Comment: occassional    Allergies:  Allergies  Allergen Reactions  . Penicillins Hives    Prescriptions prior to admission  Medication Sig Dispense Refill  . CALCIUM PO Take 1 tablet by mouth daily.      . Cholecalciferol (VITAMIN D-3 PO) Take 1 tablet by mouth daily.      Marland Kitchen estradiol (CLIMARA - DOSED IN MG/24 HR) 0.0375 mg/24hr Place 1 patch onto the skin once a week.      . Multiple Vitamin (MULTIVITAMIN WITH MINERALS) TABS Take 1 tablet by mouth daily.      . progesterone (PROMETRIUM) 100 MG capsule Take 100 mg by mouth daily.      . verapamil (CALAN-SR) 120 MG CR tablet Take 120 mg by mouth at bedtime.        Review of Systems  Constitutional: Negative.   HENT: Negative.   Eyes: Negative.   Respiratory: Negative.   Cardiovascular: Negative.   Gastrointestinal: Negative.   Genitourinary: Negative.   Musculoskeletal: Negative.   Skin: Negative.   Neurological: Negative.   Endo/Heme/Allergies: Negative.   Psychiatric/Behavioral: Negative.    Physical Exam   Blood pressure 153/91, pulse 68, temperature 98 F (36.7  C), temperature source Oral, resp. rate 18, height 5\' 7"  (1.702 m), weight 78.563 kg (173 lb 3.2 oz).  Physical Exam  Constitutional: She is oriented to person, place, and time. She appears well-developed and well-nourished.  HENT:  Head: Normocephalic and atraumatic.  Eyes: Conjunctivae normal and EOM are normal. Pupils are equal, round, and reactive to light.  Neck: Normal range of motion. Neck supple.  Cardiovascular: Normal rate, regular rhythm, normal heart sounds and intact distal pulses.   Respiratory: Effort normal and breath sounds normal.  GI: Soft. Bowel sounds are normal.       Lower abdominal cramping  Genitourinary: Uterus normal.       Patient has had 1 episode of PV bleeding today at noon. Describes large gush and then the patient states that it has  tapered off. Has had cramping for 14 hours and continues to cramp after episode of  PV.  Musculoskeletal: Normal range of motion.  Neurological: She is alert and oriented to person, place, and time. She has normal reflexes.  Skin: Skin is warm and dry.  Psychiatric: She has a normal mood and affect.     MAU Course  Procedures Labs: CBC, CMP, PT INR, Serum estrogen, Serum Progesterone, ANA. Pelvic Sono. Speculum examination Wet Prep  GC/ Chlamydia  Consult with Dr Billy Coast re management plan.    Assessment and Plan   Results for orders placed during the hospital encounter of 10/16/12 (from the past 24 hour(s))  URINALYSIS, ROUTINE W REFLEX MICROSCOPIC     Status: Abnormal   Collection Time   10/16/12  1:20 PM      Component Value Range   Color, Urine RED (*) YELLOW   APPearance HAZY (*) CLEAR   Specific Gravity, Urine 1.020  1.005 - 1.030   pH 6.0  5.0 - 8.0   Glucose, UA NEGATIVE  NEGATIVE mg/dL   Hgb urine dipstick LARGE (*) NEGATIVE   Bilirubin Urine NEGATIVE  NEGATIVE   Ketones, ur NEGATIVE  NEGATIVE mg/dL   Protein, ur 30 (*) NEGATIVE mg/dL   Urobilinogen, UA 0.2  0.0 - 1.0 mg/dL   Nitrite NEGATIVE  NEGATIVE   Leukocytes, UA NEGATIVE  NEGATIVE  URINE MICROSCOPIC-ADD ON     Status: Normal   Collection Time   10/16/12  1:20 PM      Component Value Range   Squamous Epithelial / LPF RARE  RARE   WBC, UA 0-2  <3 WBC/hpf   RBC / HPF TOO NUMEROUS TO COUNT  <3 RBC/hpf   Bacteria, UA RARE  RARE  CBC     Status: Normal   Collection Time   10/16/12  3:09 PM      Component Value Range   WBC 7.4  4.0 - 10.5 K/uL   RBC 4.50  3.87 - 5.11 MIL/uL   Hemoglobin 14.2  12.0 - 15.0 g/dL   HCT 16.1  09.6 - 04.5 %   MCV 92.2  78.0 - 100.0 fL   MCH 31.6  26.0 - 34.0 pg   MCHC 34.2  30.0 - 36.0 g/dL   RDW 40.9  81.1 - 91.4 %   Platelets 208  150 - 400 K/uL  COMPREHENSIVE METABOLIC PANEL     Status: Abnormal   Collection Time   10/16/12  3:09 PM      Component Value Range    Sodium 142  135 - 145 mEq/L   Potassium 3.9  3.5 - 5.1 mEq/L   Chloride 107  96 -  112 mEq/L   CO2 26  19 - 32 mEq/L   Glucose, Bld 86  70 - 99 mg/dL   BUN 21  6 - 23 mg/dL   Creatinine, Ser 9.56  0.50 - 1.10 mg/dL   Calcium 9.3  8.4 - 21.3 mg/dL   Total Protein 7.2  6.0 - 8.3 g/dL   Albumin 3.9  3.5 - 5.2 g/dL   AST 28  0 - 37 U/L   ALT 38 (*) 0 - 35 U/L   Alkaline Phosphatase 83  39 - 117 U/L   Total Bilirubin 0.2 (*) 0.3 - 1.2 mg/dL   GFR calc non Af Amer 88 (*) >90 mL/min   GFR calc Af Amer >90  >90 mL/min  PROTIME-INR     Status: Normal   Collection Time   10/16/12  3:09 PM      Component Value Range   Prothrombin Time 12.1  11.6 - 15.2 seconds   INR 0.90  0.00 - 1.49  WET PREP, GENITAL     Status: Abnormal   Collection Time   10/16/12  4:10 PM      Component Value Range   Yeast Wet Prep HPF POC NONE SEEN  NONE SEEN   Trich, Wet Prep NONE SEEN  NONE SEEN   Clue Cells Wet Prep HPF POC NONE SEEN  NONE SEEN   WBC, Wet Prep HPF POC FEW (*) NONE SEEN   Pelvic Sono: Impression -  Thickened Endometrium with increased vascularity, possibly reflecting a recurrent polyp. Consult with Dr. Billy Coast: patient should let bleeding subside spontaneously and to schedule a Saline Sono-Histogram at office. To remain on present hormone therapy regimen. If Bleeding continues to call office for review.  Alessander Sikorski, CNM. 10/16/2012, 3:18 PM

## 2012-10-16 NOTE — MAU Note (Signed)
"  Yesterday I started having spotting.  I went into menopause about 14 years ago, so I was going to go to the GYN office this week to get checked out.  This morning about 1100 I started having heavy bleeding and thought I needed to come here today.  I have been having some lower abd cramping that feel like menstrual cramps since this morning.  I actually have had a pain in my lower LT back for a couple of days and I'm not sure if that's related.  I have soaked 2 pads since 1100 this morning."

## 2012-10-17 LAB — GC/CHLAMYDIA PROBE AMP, GENITAL
Chlamydia, DNA Probe: NEGATIVE
GC Probe Amp, Genital: NEGATIVE

## 2012-10-17 LAB — ANA: Anti Nuclear Antibody(ANA): NEGATIVE

## 2012-10-21 LAB — 17-HYDROXYPROGESTERONE: 17-OH-Progesterone, LC/MS/MS: 32 ng/dL

## 2012-10-21 LAB — ESTROGENS, TOTAL: Estrogen: 123.5 pg/mL

## 2012-11-02 ENCOUNTER — Other Ambulatory Visit: Payer: Self-pay | Admitting: Physical Medicine and Rehabilitation

## 2012-11-02 DIAGNOSIS — M1990 Unspecified osteoarthritis, unspecified site: Secondary | ICD-10-CM

## 2012-11-15 ENCOUNTER — Ambulatory Visit
Admission: RE | Admit: 2012-11-15 | Discharge: 2012-11-15 | Disposition: A | Discharge status: Home / Self Care | Payer: BC Managed Care – PPO | Source: Ambulatory Visit | Attending: Physical Medicine and Rehabilitation | Admitting: Physical Medicine and Rehabilitation

## 2012-11-15 ENCOUNTER — Ambulatory Visit
Admission: RE | Admit: 2012-11-15 | Discharge: 2012-11-15 | Disposition: A | Discharge status: Home / Self Care | Payer: BC Managed Care – PPO | Source: Ambulatory Visit | Attending: Neurology | Admitting: Neurology

## 2012-11-15 ENCOUNTER — Other Ambulatory Visit: Payer: Self-pay | Admitting: Physical Medicine and Rehabilitation

## 2012-11-15 DIAGNOSIS — R519 Headache, unspecified: Secondary | ICD-10-CM

## 2012-11-15 DIAGNOSIS — R200 Anesthesia of skin: Secondary | ICD-10-CM

## 2012-11-15 DIAGNOSIS — M1990 Unspecified osteoarthritis, unspecified site: Secondary | ICD-10-CM

## 2012-11-16 ENCOUNTER — Ambulatory Visit
Admission: RE | Admit: 2012-11-16 | Discharge: 2012-11-16 | Disposition: A | Discharge status: Home / Self Care | Payer: BC Managed Care – PPO | Source: Ambulatory Visit | Attending: Physical Medicine and Rehabilitation | Admitting: Physical Medicine and Rehabilitation

## 2012-11-16 ENCOUNTER — Other Ambulatory Visit: Payer: Self-pay | Admitting: Physical Medicine and Rehabilitation

## 2012-11-16 DIAGNOSIS — M1990 Unspecified osteoarthritis, unspecified site: Secondary | ICD-10-CM

## 2012-11-16 MED ORDER — GADOBENATE DIMEGLUMINE 529 MG/ML IV SOLN
15.0000 mL | Freq: Once | INTRAVENOUS | Status: AC | PRN
  Administered 2012-11-16: 15 mL via INTRAVENOUS

## 2012-11-30 DIAGNOSIS — C541 Malignant neoplasm of endometrium: Secondary | ICD-10-CM

## 2012-11-30 HISTORY — DX: Malignant neoplasm of endometrium: C54.1

## 2012-12-01 ENCOUNTER — Encounter: Payer: Self-pay | Admitting: Gynecologic Oncology

## 2012-12-01 ENCOUNTER — Ambulatory Visit: Payer: BC Managed Care – PPO | Admitting: Gynecologic Oncology

## 2012-12-01 ENCOUNTER — Ambulatory Visit: Payer: BC Managed Care – PPO | Attending: Gynecologic Oncology | Admitting: Gynecologic Oncology

## 2012-12-01 VITALS — Ht 66.3 in | Wt 170.9 lb

## 2012-12-01 DIAGNOSIS — C549 Malignant neoplasm of corpus uteri, unspecified: Secondary | ICD-10-CM | POA: Insufficient documentation

## 2012-12-01 DIAGNOSIS — C541 Malignant neoplasm of endometrium: Secondary | ICD-10-CM | POA: Insufficient documentation

## 2012-12-01 NOTE — Patient Instructions (Signed)
Please keep appointment for pre-op visit.

## 2012-12-01 NOTE — Progress Notes (Signed)
Consult Note: Gyn-Onc  Brooke Williams 67 y.o. female  CC:  Chief Complaint  Patient presents with  . Endometrial Cancer    New pt    HPI: Patient is seen today in consultation at the request of Dr. Taavon.  Patient is a 67-year-old gravida 0 who went through menopause at the age of 52. Since that time she has been on hormone replacement therapy. Currently she is taking in many fell 0.0375 mg per 24-hour patch biweekly as well as Prometrium 100 mg each bedtime. As this is been on hormone replacement therapy since the age of 52 it is really not had any bleeding until November 2013. Specifically on the Sunday before Thanksgiving she and her husband were out at an advantage began hemorrhaging. [facility] The bleeding was associated with significant medical having and clots. She underwent a sonohysterogram a Pap smear and an endometrial biopsy. Your Pap smear was negative. 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 She's otherwise been doing fairly well she is really not had any significant bleeding as a hemorrhage that she had when she initially presented she might have a little bit of spotting every day and as well as some occasional cramping. She is taking Naprosyn to help to cramping as well as decrease the amount of bleeding. She's also walking 30 minutes a day to help to cramping and that is helping her significantly. She does have migraine headaches and has nausea most every morning. She states she has a migraine headache every day in the morning gets better during the day. Well she has a migraine every morning she states she only has been "bad" 3-4 days per week. She and her husband are not currently sexually active.   Review of Systems: She has a chest pain, nausea, vomiting, fevers, headaches other than as mentioned above, visual changes. She denies any change about bladder habits. She has any incontinence or dysuria. She  has any pelvic or vaginal pain. She does have the vaginal bleeding as discussed above but no vaginal discharge. She denies any unintentional weight loss weight gain. She has a change in her bowel habits. 10 point review of systems is negative.  Current Meds: She is taking mangotine, tart cherry, ginger, garlic, coenzyme Q10, kelp, fish oil. Outpatient Encounter Prescriptions as of 12/01/2012  Medication Sig Dispense Refill  . CALCIUM PO Take 1,000 mg by mouth daily.       . Cholecalciferol (VITAMIN D-3 PO) Take 1 tablet by mouth daily. 2000 IU      . Coenzyme Q10 (CO Q 10 PO) Take 300 mg by mouth daily.      . estradiol (CLIMARA - DOSED IN MG/24 HR) 0.0375 mg/24hr Place 1 patch onto the skin once a week.      . fish oil-omega-3 fatty acids 1000 MG capsule Take 1 g by mouth daily.      . loratadine (CLARITIN) 10 MG tablet Take 10 mg by mouth daily.      . methocarbamol (ROBAXIN) 500 MG tablet Take 500 mg by mouth as needed.      . Multiple Vitamin (MULTIVITAMIN WITH MINERALS) TABS Take 1 tablet by mouth daily.      . naproxen (NAPROSYN) 500 MG tablet Take 500 mg by mouth 2 (two) times daily with a meal.      . progesterone (PROMETRIUM) 100 MG capsule Take 100 mg by mouth daily.      . psyllium (  METAMUCIL) 58.6 % powder Take 1 packet by mouth as needed.      . ranitidine (ZANTAC) 150 MG tablet Take 150 mg by mouth daily.      . verapamil (CALAN-SR) 120 MG CR tablet Take 120 mg by mouth at bedtime.        Allergy: She can take Keflex without difficulty. Allergies  Allergen Reactions  . Penicillins Hives    Social Hx:   History   Social History  . Marital Status: Married    Spouse Name: N/A    Number of Children: N/A  . Years of Education: N/A   Occupational History  . Not on file.   Social History Main Topics  . Smoking status: Never Smoker   . Smokeless tobacco: Not on file  . Alcohol Use: Yes     Comment: occassional  . Drug Use: No  . Sexually Active: Not Currently    Birth  Control/ Protection: Post-menopausal   Other Topics Concern  . Not on file   Social History Narrative  . No narrative on file    Past Surgical Hx: Melanoma removed from the right cheek, lower back cyst removed in her 20s. Multiple lipoma removal from her upper extremities. Past Surgical History  Procedure Date  . Medial partial knee replacement     LT knee  . Other surgical history     metal pin/screw to repair arthritis in LT thumb joint  . Hand surgery     left thumb surgery    Past Medical Hx: Melanoma Past Medical History  Diagnosis Date  . Lupus     in remission for 5-6 yrs, not on meds now.  . Arthritis   . Endometrial cancer     Family Hx: Her mother had endometrial cancer at the age of 65. Her identical twin (patient's maternal aunt) had colon cancer in her late 70s Family History  Problem Relation Age of Onset  . Endometriosis Mother   . Stroke Mother 85    arterial wall of artery in eye   . Dementia Father   . Stroke Father     "lots of many strokes"  . Aneurysm Father   . Multiple births Maternal Grandmother     died 5 days after delivery of unknown causes    Vitals:  Height 5' 6.3" (1.684 m), weight 170 lb 14.4 oz (77.52 kg).  Physical Exam:  Well-nourished well-developed female in no acute distress.  Neck: Supple, no lymphadenopathy, no thyromegaly.  Lungs: Clear to auscultation bilaterally.  Cardiac: Regular rate and rhythm.  Abdomen: Soft, nontender, nondistended. There is no palpable mass or hepatosplenomegaly.  Groins: No lymphadenopathy.  Extremities: Well-healed surgical incision in her left knee. 2 cm lipoma under her right axilla, multiple skin incisions from surgical procedures on her upper extremities  Pelvic: Normal external female genitalia. Vagina is atrophic. The cervix is nulliparous. There's a physiologic discharge. There's no visible lesions. Bimanual examination the cervix is probably normal the corpus is of normal size shape  and consistency there is no adnexal masses.  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 We discussed proceeding with a total hysterectomy bilateral salpingo-oophorectomy and appropriate surgery including pelvic and para-aortic lymphadenectomy. We discussed that we would proceed with an attempt at a minimally invasive procedure with robotic assistance. She would not be able to accomplish the procedure and a robotic fashion we'll proceed with a midline laparotomy.  Risks of the procedure including but not limited to bleeding,   infection, injury to surrounding organs, need for additional surgery, laparotomy, and thromboembolic disease were discussed the patient. Her questions regarding this were elicited to her satisfaction. We also discussed the need for her to stop taking her Naprosyn and other anti-inflammatory medication 7 days before surgery secondary to antiplatelet activities. Her surgery is tentatively scheduled for January 21.  I will ask pathology to perform testing for microsatellite instability. If it is interesting that her mother as well as her identical twin both had endometrial as well as colon cancer and now this patient he doesn't have any risk factors for endometrial cancer has developed it. She is amenable to us performing this testing and would be well coming to genetics consult should microsatellite instability testing revealing a potential genetic mutation.  She will come in for preoperative visit. Again her questions were elicited in answer to her satisfaction. They are ery pleased with the consultation today. Thank you for allowing us the opportunity to participate in  the care of this very pleasant patient. Makyiah Lie A., MD 12/01/2012, 2:09 PM   

## 2012-12-07 ENCOUNTER — Encounter (HOSPITAL_COMMUNITY): Payer: Self-pay | Admitting: Pharmacy Technician

## 2012-12-13 ENCOUNTER — Other Ambulatory Visit: Payer: BC Managed Care – PPO

## 2012-12-16 ENCOUNTER — Ambulatory Visit (HOSPITAL_COMMUNITY)
Admission: RE | Admit: 2012-12-16 | Discharge: 2012-12-16 | Disposition: A | Discharge status: Home / Self Care | Payer: BC Managed Care – PPO | Source: Ambulatory Visit | Attending: Gynecologic Oncology | Admitting: Gynecologic Oncology

## 2012-12-16 ENCOUNTER — Encounter (HOSPITAL_COMMUNITY): Payer: Self-pay

## 2012-12-16 ENCOUNTER — Encounter (HOSPITAL_COMMUNITY)
Admission: RE | Admit: 2012-12-16 | Discharge: 2012-12-16 | Disposition: A | Discharge status: Home / Self Care | Payer: BC Managed Care – PPO | Source: Ambulatory Visit | Attending: Gynecologic Oncology | Admitting: Gynecologic Oncology

## 2012-12-16 DIAGNOSIS — C549 Malignant neoplasm of corpus uteri, unspecified: Secondary | ICD-10-CM | POA: Insufficient documentation

## 2012-12-16 DIAGNOSIS — Z01812 Encounter for preprocedural laboratory examination: Secondary | ICD-10-CM | POA: Insufficient documentation

## 2012-12-16 DIAGNOSIS — R911 Solitary pulmonary nodule: Secondary | ICD-10-CM | POA: Insufficient documentation

## 2012-12-16 HISTORY — DX: Gastro-esophageal reflux disease without esophagitis: K21.9

## 2012-12-16 HISTORY — DX: Other seasonal allergic rhinitis: J30.2

## 2012-12-16 HISTORY — DX: Headache: R51

## 2012-12-16 HISTORY — DX: Nausea with vomiting, unspecified: R11.2

## 2012-12-16 HISTORY — DX: Other specified postprocedural states: Z98.890

## 2012-12-16 LAB — COMPREHENSIVE METABOLIC PANEL
ALT: 42 U/L — ABNORMAL HIGH (ref 0–35)
AST: 31 U/L (ref 0–37)
Albumin: 4.3 g/dL (ref 3.5–5.2)
Alkaline Phosphatase: 84 U/L (ref 39–117)
BUN: 20 mg/dL (ref 6–23)
CO2: 26 mEq/L (ref 19–32)
Calcium: 10.2 mg/dL (ref 8.4–10.5)
Chloride: 106 mEq/L (ref 96–112)
Creatinine, Ser: 0.63 mg/dL (ref 0.50–1.10)
GFR calc Af Amer: >90 mL/min (ref >90)
GFR calc non Af Amer: >90 mL/min (ref >90)
Glucose, Bld: 91 mg/dL (ref 70–99)
Potassium: 4 mEq/L (ref 3.5–5.1)
Sodium: 143 mEq/L (ref 135–145)
Total Bilirubin: 0.4 mg/dL (ref 0.3–1.2)
Total Protein: 7.5 g/dL (ref 6.0–8.3)

## 2012-12-16 LAB — CBC WITH DIFFERENTIAL/PLATELET
Basophils Absolute: 0.1 10*3/uL (ref 0.0–0.1)
Basophils Relative: 1 % (ref 0–1)
Eosinophils Absolute: 0.1 10*3/uL (ref 0.0–0.7)
Eosinophils Relative: 2 % (ref 0–5)
HCT: 43.1 % (ref 36.0–46.0)
Hemoglobin: 14.5 g/dL (ref 12.0–15.0)
Lymphocytes Relative: 18 % (ref 12–46)
Lymphs Abs: 1.2 10*3/uL (ref 0.7–4.0)
MCH: 31 pg (ref 26.0–34.0)
MCHC: 33.6 g/dL (ref 30.0–36.0)
MCV: 92.1 fL (ref 78.0–100.0)
Monocytes Absolute: 0.7 10*3/uL (ref 0.1–1.0)
Monocytes Relative: 10 % (ref 3–12)
Neutro Abs: 4.3 10*3/uL (ref 1.7–7.7)
Neutrophils Relative %: 68 % (ref 43–77)
Platelets: 245 10*3/uL (ref 150–400)
RBC: 4.68 MIL/uL (ref 3.87–5.11)
RDW: 13.7 % (ref 11.5–15.5)
WBC: 6.3 10*3/uL (ref 4.0–10.5)

## 2012-12-16 LAB — ABO/RH: ABO/RH(D): B POS

## 2012-12-16 LAB — SURGICAL PCR SCREEN
MRSA, PCR: NEGATIVE
Staphylococcus aureus: POSITIVE — AB

## 2012-12-16 NOTE — Pre-Procedure Instructions (Addendum)
12-16-12 CXR done today. EKG 10'13-report with chart. 12-16-12 1555 Cxr report sent to Dr. Denman George in basket- [id number]. 12-19-12 0915 Pt. Notified of Positive Staph aureus PCR screen-will use Mupirocin as directed. Telford Nab -aware of CXR results of 12-16-12.W. Reeva Davern,RN

## 2012-12-16 NOTE — Patient Instructions (Addendum)
20 Brooke Williams  12/16/2012   Your procedure is scheduled on: 1-21  -2014  Report to [facility] at    0515    AM  Call this number if you have problems the morning of surgery: [phone]  Or Presurgical Testing [phone](Cleven Jansma)   Remember: Follow any bowel prep instructions per MD office.    Do not eat food:After Midnight.  Follow clear liquids x24 hours preop prior to surgery begin 0700 AM(12-19-12 until 12 midnight), then nothing.  Clear liquids include soda, tea, black coffee, apple or grape juice, broth.-see list given.  Take these medicines the morning of surgery with A SIP OF WATER: Loratadine. Zantac-if needed   Do not wear jewelry, make-up or nail polish.  Do not wear lotions, powders, or perfumes. You may wear deodorant.  Do not shave 12 hours prior to first CHG shower(legs and under arms).(face and neck okay.)  Do not bring valuables to the hospital.  Contacts, dentures or bridgework,body piercing,  may not be worn into surgery.  Leave suitcase in the car. After surgery it may be brought to your room.  For patients admitted to the hospital, checkout time is 11:00 AM the day of discharge.   Patients discharged the day of surgery will not be allowed to drive home. Must have responsible person with you x 24 hours once discharged.  Name and phone number of your driver: spouse [name] -130- [phone] cell.  Special Instructions: CHG Shower Use Special Wash: see special instructions.(avoid face and genitals)   Please read over the following fact sheets that you were given: MRSA Information, Blood Transfusion fact sheet, Incentive Spirometry Instruction.    Failure to follow these instructions may result in Cancellation of your surgery.   Patient signature_______________________________________________________

## 2012-12-16 NOTE — Progress Notes (Signed)
12-16-12 1555 Please note CXR report done today-viewable in Epic.W. Kennon Portela

## 2012-12-17 ENCOUNTER — Telehealth: Payer: Self-pay | Admitting: *Deleted

## 2012-12-17 NOTE — Telephone Encounter (Signed)
Pt notified of CXR resluts, will f/u with Dr Duard Brady 12/20/12

## 2012-12-17 NOTE — Telephone Encounter (Signed)
Pt notified of CXR results

## 2012-12-17 NOTE — Telephone Encounter (Signed)
Attempted to call pt re cxr. Will try later

## 2012-12-20 ENCOUNTER — Observation Stay (HOSPITAL_COMMUNITY)
Admission: RE | Admit: 2012-12-20 | Discharge: 2012-12-21 | DRG: 354 | Disposition: A | Discharge status: Home / Self Care | Payer: BC Managed Care – PPO | Source: Ambulatory Visit | Attending: Obstetrics & Gynecology | Admitting: Obstetrics & Gynecology

## 2012-12-20 ENCOUNTER — Ambulatory Visit (HOSPITAL_COMMUNITY): Payer: BC Managed Care – PPO | Admitting: Anesthesiology

## 2012-12-20 ENCOUNTER — Encounter (HOSPITAL_COMMUNITY): Payer: Self-pay | Admitting: *Deleted

## 2012-12-20 ENCOUNTER — Encounter (HOSPITAL_COMMUNITY): Payer: Self-pay | Admitting: Gynecologic Oncology

## 2012-12-20 ENCOUNTER — Encounter (HOSPITAL_COMMUNITY): Payer: Self-pay | Admitting: Anesthesiology

## 2012-12-20 ENCOUNTER — Encounter (HOSPITAL_COMMUNITY)
Admission: RE | Disposition: A | Discharge status: Home / Self Care | Payer: Self-pay | Source: Ambulatory Visit | Attending: Obstetrics & Gynecology

## 2012-12-20 DIAGNOSIS — C549 Malignant neoplasm of corpus uteri, unspecified: Principal | ICD-10-CM | POA: Insufficient documentation

## 2012-12-20 DIAGNOSIS — Z8049 Family history of malignant neoplasm of other genital organs: Secondary | ICD-10-CM | POA: Insufficient documentation

## 2012-12-20 DIAGNOSIS — Z79899 Other long term (current) drug therapy: Secondary | ICD-10-CM | POA: Insufficient documentation

## 2012-12-20 DIAGNOSIS — Z85820 Personal history of malignant melanoma of skin: Secondary | ICD-10-CM | POA: Insufficient documentation

## 2012-12-20 DIAGNOSIS — C541 Malignant neoplasm of endometrium: Secondary | ICD-10-CM | POA: Diagnosis present

## 2012-12-20 DIAGNOSIS — Z800 Family history of malignant neoplasm of digestive organs: Secondary | ICD-10-CM | POA: Insufficient documentation

## 2012-12-20 DIAGNOSIS — M329 Systemic lupus erythematosus, unspecified: Secondary | ICD-10-CM | POA: Insufficient documentation

## 2012-12-20 HISTORY — PX: ROBOTIC PELVIC AND PARA-AORTIC LYMPH NODE DISSECTION: SHX6210

## 2012-12-20 HISTORY — PX: ROBOTIC ASSISTED TOTAL HYSTERECTOMY WITH BILATERAL SALPINGO OOPHERECTOMY: SHX6086

## 2012-12-20 LAB — TYPE AND SCREEN
ABO/RH(D): B POS
Antibody Screen: NEGATIVE

## 2012-12-20 SURGERY — ROBOTIC ASSISTED TOTAL HYSTERECTOMY WITH BILATERAL SALPINGO OOPHORECTOMY
Anesthesia: General | Site: Abdomen | Wound class: Clean Contaminated

## 2012-12-20 MED ORDER — KETOROLAC TROMETHAMINE 30 MG/ML IJ SOLN: 30.0000 mg | Freq: Four times a day (QID) | INTRAMUSCULAR | Status: AC

## 2012-12-20 MED ORDER — HYDROMORPHONE HCL PF 1 MG/ML IJ SOLN: 0.2500 mg | INTRAMUSCULAR | Status: DC | PRN

## 2012-12-20 MED ORDER — SCOPOLAMINE 1 MG/3DAYS TD PT72
MEDICATED_PATCH | TRANSDERMAL | Status: AC
  Filled 2012-12-20: qty 1

## 2012-12-20 MED ORDER — ONDANSETRON HCL 4 MG/2ML IJ SOLN
INTRAMUSCULAR | Status: DC | PRN
  Administered 2012-12-20: 4 mg via INTRAVENOUS

## 2012-12-20 MED ORDER — MORPHINE SULFATE 2 MG/ML IJ SOLN: 2.0000 mg | INTRAMUSCULAR | Status: DC | PRN

## 2012-12-20 MED ORDER — HYDROMORPHONE HCL PF 1 MG/ML IJ SOLN
INTRAMUSCULAR | Status: DC | PRN
  Administered 2012-12-20 (×4): 0.5 mg via INTRAVENOUS

## 2012-12-20 MED ORDER — ZOLPIDEM TARTRATE 5 MG PO TABS: 5.0000 mg | ORAL_TABLET | Freq: Every evening | ORAL | Status: DC | PRN

## 2012-12-20 MED ORDER — METRONIDAZOLE IN NACL 5-0.79 MG/ML-% IV SOLN
500.0000 mg | INTRAVENOUS | Status: AC
  Administered 2012-12-20: 1 g via INTRAVENOUS

## 2012-12-20 MED ORDER — PROPOFOL 10 MG/ML IV BOLUS
INTRAVENOUS | Status: DC | PRN
  Administered 2012-12-20: 160 mg via INTRAVENOUS

## 2012-12-20 MED ORDER — ONDANSETRON HCL 4 MG/2ML IJ SOLN: 4.0000 mg | Freq: Four times a day (QID) | INTRAMUSCULAR | Status: DC | PRN

## 2012-12-20 MED ORDER — OXYCODONE-ACETAMINOPHEN 5-325 MG PO TABS
1.0000 | ORAL_TABLET | ORAL | Status: DC | PRN
  Administered 2012-12-21: 1 via ORAL
  Filled 2012-12-20: qty 1

## 2012-12-20 MED ORDER — ONDANSETRON HCL 4 MG PO TABS: 4.0000 mg | ORAL_TABLET | Freq: Four times a day (QID) | ORAL | Status: DC | PRN

## 2012-12-20 MED ORDER — ACETAMINOPHEN 10 MG/ML IV SOLN
INTRAVENOUS | Status: AC
  Filled 2012-12-20: qty 100

## 2012-12-20 MED ORDER — PROMETHAZINE HCL 25 MG/ML IJ SOLN: 6.2500 mg | INTRAMUSCULAR | Status: DC | PRN

## 2012-12-20 MED ORDER — SUFENTANIL CITRATE 50 MCG/ML IV SOLN
INTRAVENOUS | Status: DC | PRN
  Administered 2012-12-20: 15 ug via INTRAVENOUS
  Administered 2012-12-20: 10 ug via INTRAVENOUS

## 2012-12-20 MED ORDER — CIPROFLOXACIN IN D5W 400 MG/200ML IV SOLN
400.0000 mg | INTRAVENOUS | Status: AC
  Administered 2012-12-20: 400 mg via INTRAVENOUS

## 2012-12-20 MED ORDER — ROCURONIUM BROMIDE 100 MG/10ML IV SOLN
INTRAVENOUS | Status: DC | PRN
  Administered 2012-12-20: 50 mg via INTRAVENOUS

## 2012-12-20 MED ORDER — METOCLOPRAMIDE HCL 5 MG/ML IJ SOLN
INTRAMUSCULAR | Status: DC | PRN
  Administered 2012-12-20: 10 mg via INTRAVENOUS

## 2012-12-20 MED ORDER — LACTATED RINGERS IV SOLN
INTRAVENOUS | Status: DC | PRN
  Administered 2012-12-20: 1000 mL via INTRAVENOUS

## 2012-12-20 MED ORDER — STERILE WATER FOR IRRIGATION IR SOLN
Status: DC | PRN
  Administered 2012-12-20: 3000 mL

## 2012-12-20 MED ORDER — ACETAMINOPHEN 10 MG/ML IV SOLN
INTRAVENOUS | Status: DC | PRN
  Administered 2012-12-20: 1000 mg via INTRAVENOUS

## 2012-12-20 MED ORDER — SCOPOLAMINE 1 MG/3DAYS TD PT72
MEDICATED_PATCH | TRANSDERMAL | Status: DC | PRN
  Administered 2012-12-20: 1 via TRANSDERMAL

## 2012-12-20 MED ORDER — MIDAZOLAM HCL 5 MG/5ML IJ SOLN
INTRAMUSCULAR | Status: DC | PRN
  Administered 2012-12-20: 2 mg via INTRAVENOUS

## 2012-12-20 MED ORDER — KCL IN DEXTROSE-NACL 20-5-0.45 MEQ/L-%-% IV SOLN
INTRAVENOUS | Status: DC
  Administered 2012-12-20: 20:00:00 via INTRAVENOUS
  Administered 2012-12-20: 125 mL/h via INTRAVENOUS
  Administered 2012-12-21: 04:00:00 via INTRAVENOUS
  Filled 2012-12-20 (×4): qty 1000

## 2012-12-20 MED ORDER — CIPROFLOXACIN IN D5W 400 MG/200ML IV SOLN
INTRAVENOUS | Status: AC
  Filled 2012-12-20: qty 200

## 2012-12-20 MED ORDER — LIDOCAINE HCL (CARDIAC) 20 MG/ML IV SOLN
INTRAVENOUS | Status: DC | PRN
  Administered 2012-12-20: 60 mg via INTRAVENOUS

## 2012-12-20 MED ORDER — METRONIDAZOLE IN NACL 5-0.79 MG/ML-% IV SOLN
INTRAVENOUS | Status: AC
  Filled 2012-12-20: qty 100

## 2012-12-20 MED ORDER — DEXAMETHASONE SODIUM PHOSPHATE 10 MG/ML IJ SOLN
INTRAMUSCULAR | Status: DC | PRN
  Administered 2012-12-20: 10 mg via INTRAVENOUS

## 2012-12-20 MED ORDER — NEOSTIGMINE METHYLSULFATE 1 MG/ML IJ SOLN
INTRAMUSCULAR | Status: DC | PRN
  Administered 2012-12-20: 4 mg via INTRAVENOUS

## 2012-12-20 MED ORDER — CISATRACURIUM BESYLATE (PF) 10 MG/5ML IV SOLN
INTRAVENOUS | Status: DC | PRN
  Administered 2012-12-20: 4 mg via INTRAVENOUS

## 2012-12-20 MED ORDER — KETOROLAC TROMETHAMINE 30 MG/ML IJ SOLN: 15.0000 mg | Freq: Once | INTRAMUSCULAR | Status: DC | PRN

## 2012-12-20 MED ORDER — KETOROLAC TROMETHAMINE 30 MG/ML IJ SOLN
30.0000 mg | Freq: Four times a day (QID) | INTRAMUSCULAR | Status: AC
  Administered 2012-12-20 – 2012-12-21 (×3): 30 mg via INTRAVENOUS
  Filled 2012-12-20 (×3): qty 1

## 2012-12-20 MED ORDER — LACTATED RINGERS IV SOLN
INTRAVENOUS | Status: DC | PRN
  Administered 2012-12-20 (×3): via INTRAVENOUS

## 2012-12-20 MED ORDER — GLYCOPYRROLATE 0.2 MG/ML IJ SOLN
INTRAMUSCULAR | Status: DC | PRN
  Administered 2012-12-20: .6 mg via INTRAVENOUS

## 2012-12-20 SURGICAL SUPPLY — 49 items
BENZOIN TINCTURE PRP APPL 2/3 (GAUZE/BANDAGES/DRESSINGS) ×3 IMPLANT
CHLORAPREP W/TINT 26ML (MISCELLANEOUS) ×6 IMPLANT
CLOTH BEACON ORANGE TIMEOUT ST (SAFETY) ×3 IMPLANT
CORD HIGH FREQUENCY UNIPOLAR (ELECTROSURGICAL) ×3 IMPLANT
CORDS BIPOLAR (ELECTRODE) ×3 IMPLANT
COVER MAYO STAND STRL (DRAPES) ×3 IMPLANT
COVER SURGICAL LIGHT HANDLE (MISCELLANEOUS) ×3 IMPLANT
COVER TIP SHEARS 8 DVNC (MISCELLANEOUS) ×2 IMPLANT
COVER TIP SHEARS 8MM DA VINCI (MISCELLANEOUS) ×1
DECANTER SPIKE VIAL GLASS SM (MISCELLANEOUS) IMPLANT
DRAPE LG THREE QUARTER DISP (DRAPES) ×9 IMPLANT
DRAPE SURG IRRIG POUCH 19X23 (DRAPES) ×3 IMPLANT
DRAPE TABLE BACK 44X90 PK DISP (DRAPES) ×3 IMPLANT
DRAPE UTILITY XL STRL (DRAPES) ×3 IMPLANT
DRAPE WARM FLUID 44X44 (DRAPE) ×3 IMPLANT
DRSG TEGADERM 6X8 (GAUZE/BANDAGES/DRESSINGS) ×6 IMPLANT
ELECT REM PT RETURN 9FT ADLT (ELECTROSURGICAL) ×3
ELECTRODE REM PT RTRN 9FT ADLT (ELECTROSURGICAL) ×2 IMPLANT
GAUZE VASELINE 3X9 (GAUZE/BANDAGES/DRESSINGS) IMPLANT
GLOVE BIO SURGEON STRL SZ 6.5 (GLOVE) ×18 IMPLANT
GLOVE BIO SURGEON STRL SZ7.5 (GLOVE) ×6 IMPLANT
GLOVE BIOGEL PI IND STRL 7.0 (GLOVE) ×4 IMPLANT
GLOVE BIOGEL PI INDICATOR 7.0 (GLOVE) ×2
GOWN PREVENTION PLUS XLARGE (GOWN DISPOSABLE) ×15 IMPLANT
HOLDER FOLEY CATH W/STRAP (MISCELLANEOUS) ×3 IMPLANT
KIT ACCESSORY DA VINCI DISP (KITS) ×1
KIT ACCESSORY DVNC DISP (KITS) ×2 IMPLANT
MANIPULATOR UTERINE 4.5 ZUMI (MISCELLANEOUS) ×3 IMPLANT
OCCLUDER COLPOPNEUMO (BALLOONS) ×3 IMPLANT
PACK LAPAROSCOPY W LONG (CUSTOM PROCEDURE TRAY) ×3 IMPLANT
POUCH SPECIMEN RETRIEVAL 10MM (ENDOMECHANICALS) ×9 IMPLANT
SET TUBE IRRIG SUCTION NO TIP (IRRIGATION / IRRIGATOR) ×3 IMPLANT
SHEET LAVH (DRAPES) ×3 IMPLANT
SLEEVE SURGEON STRL (DRAPES) ×3 IMPLANT
SOLUTION ELECTROLUBE (MISCELLANEOUS) ×3 IMPLANT
SPONGE LAP 18X18 X RAY DECT (DISPOSABLE) IMPLANT
STRIP CLOSURE SKIN 1/2X4 (GAUZE/BANDAGES/DRESSINGS) ×6 IMPLANT
SUT VIC AB 0 CT1 27 (SUTURE) ×1
SUT VIC AB 0 CT1 27XBRD ANTBC (SUTURE) ×2 IMPLANT
SUT VIC AB 4-0 PS2 27 (SUTURE) ×6 IMPLANT
SUT VICRYL 0 UR6 27IN ABS (SUTURE) ×3 IMPLANT
SYR 50ML LL SCALE MARK (SYRINGE) ×3 IMPLANT
SYR BULB IRRIGATION 50ML (SYRINGE) IMPLANT
TRAP SPECIMEN MUCOUS 40CC (MISCELLANEOUS) IMPLANT
TRAY FOLEY CATH 14FRSI W/METER (CATHETERS) ×3 IMPLANT
TROCAR XCEL 12X100 BLDLESS (ENDOMECHANICALS) ×3 IMPLANT
TROCAR XCEL BLADELESS 5X75MML (TROCAR) ×3 IMPLANT
TROCAR XCEL BLUNT TIP 100MML (ENDOMECHANICALS) ×3 IMPLANT
WATER STERILE IRR 1500ML POUR (IV SOLUTION) ×6 IMPLANT

## 2012-12-20 NOTE — Interval H&P Note (Signed)
History and Physical Interval Note:  12/20/2012 7:02 AM  Brooke Williams  has presented today for surgery, with the diagnosis of ENDOMETRIAL CANCER  The various methods of treatment have been discussed with the patient and family. After consideration of risks, benefits and other options for treatment, the patient has consented to  Procedure(s) (LRB) with comments: ROBOTIC ASSISTED TOTAL HYSTERECTOMY WITH BILATERAL SALPINGO OOPHORECTOMY (N/A) - WITH PELVIC PARAAORTIC  as a surgical intervention .  The patient's history has been reviewed, patient examined, no change in status, stable for surgery.  I have reviewed the patient's chart and labs.  Questions were answered to the patient's satisfaction.     Pria Klosinski A.

## 2012-12-20 NOTE — H&P (View-Only) (Signed)
Consult Note: Gyn-Onc  Brooke Williams 67 y.o. female  CC:  Chief Complaint  Patient presents with  . Endometrial Cancer    New pt    HPI: Patient is seen today in consultation at the request of Dr. Billy Coast.  Patient is a 67 year old gravida 0 who went through menopause at the age of 67. Since that time she has been on hormone replacement therapy. Currently she is taking in many fell 0.0375 mg per 24-hour patch biweekly as well as Prometrium 100 mg each bedtime. As this is been on hormone replacement therapy since the age of 67 it is really not had any bleeding until November 2013. Specifically on the Sunday before Thanksgiving she and her husband were out at an advantage began hemorrhaging. She was seen at the [facility] hospital. The bleeding was associated with significant medical having and clots. She underwent a sonohysterogram a Pap smear and an endometrial biopsy. Your Pap smear was negative. 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 She's otherwise been doing fairly well she is really not had any significant bleeding as a hemorrhage that she had when she initially presented she might have a little bit of spotting every day and as well as some occasional cramping. She is taking Naprosyn to help to cramping as well as decrease the amount of bleeding. She's also walking 30 minutes a day to help to cramping and that is helping her significantly. She does have migraine headaches and has nausea most every morning. She states she has a migraine headache every day in the morning gets better during the day. Well she has a migraine every morning she states she only has been "bad" 3-4 days per week. She and her husband are not currently sexually active.   Review of Systems: She has a chest pain, nausea, vomiting, fevers, headaches other than as mentioned above, visual changes. She denies any change about bladder habits. She has any incontinence or dysuria. She  has any pelvic or vaginal pain. She does have the vaginal bleeding as discussed above but no vaginal discharge. She denies any unintentional weight loss weight gain. She has a change in her bowel habits. 10 point review of systems is negative.  Current Meds: She is taking mangotine, tart cherry, ginger, garlic, coenzyme Q10, kelp, fish oil. Outpatient Encounter Prescriptions as of 12/01/2012  Medication Sig Dispense Refill  . CALCIUM PO Take 1,000 mg by mouth daily.       . Cholecalciferol (VITAMIN D-3 PO) Take 1 tablet by mouth daily. 2000 IU      . Coenzyme Q10 (CO Q 10 PO) Take 300 mg by mouth daily.      Marland Kitchen estradiol (CLIMARA - DOSED IN MG/24 HR) 0.0375 mg/24hr Place 1 patch onto the skin once a week.      . fish oil-omega-3 fatty acids 1000 MG capsule Take 1 g by mouth daily.      Marland Kitchen loratadine (CLARITIN) 10 MG tablet Take 10 mg by mouth daily.      . methocarbamol (ROBAXIN) 500 MG tablet Take 500 mg by mouth as needed.      . Multiple Vitamin (MULTIVITAMIN WITH MINERALS) TABS Take 1 tablet by mouth daily.      . naproxen (NAPROSYN) 500 MG tablet Take 500 mg by mouth 2 (two) times daily with a meal.      . progesterone (PROMETRIUM) 100 MG capsule Take 100 mg by mouth daily.      . psyllium (  METAMUCIL) 58.6 % powder Take 1 packet by mouth as needed.      . ranitidine (ZANTAC) 150 MG tablet Take 150 mg by mouth daily.      . verapamil (CALAN-SR) 120 MG CR tablet Take 120 mg by mouth at bedtime.        Allergy: She can take Keflex without difficulty. Allergies  Allergen Reactions  . Penicillins Hives    Social Hx:   History   Social History  . Marital Status: Married    Spouse Name: N/A    Number of Children: N/A  . Years of Education: N/A   Occupational History  . Not on file.   Social History Main Topics  . Smoking status: Never Smoker   . Smokeless tobacco: Not on file  . Alcohol Use: Yes     Comment: occassional  . Drug Use: No  . Sexually Active: Not Currently    Birth  Control/ Protection: Post-menopausal   Other Topics Concern  . Not on file   Social History Narrative  . No narrative on file    Past Surgical Hx: Melanoma removed from the right cheek, lower back cyst removed in her 34s. Multiple lipoma removal from her upper extremities. Past Surgical History  Procedure Date  . Medial partial knee replacement     LT knee  . Other surgical history     metal pin/screw to repair arthritis in LT thumb joint  . Hand surgery     left thumb surgery    Past Medical Hx: Melanoma Past Medical History  Diagnosis Date  . Lupus     in remission for 5-6 yrs, not on meds now.  . Arthritis   . Endometrial cancer     Family Hx: Her mother had endometrial cancer at the age of 64. Her identical twin (patient's maternal aunt) had colon cancer in her late 1s Family History  Problem Relation Age of Onset  . Endometriosis Mother   . Stroke Mother 74    arterial wall of artery in eye   . Dementia Father   . Stroke Father     "lots of many strokes"  . Aneurysm Father   . Multiple births Maternal Grandmother     died 5 days after delivery of unknown causes    Vitals:  Height 5' 6.3" (1.684 m), weight 170 lb 14.4 oz (77.52 kg).  Physical Exam:  Well-nourished well-developed female in no acute distress.  Neck: Supple, no lymphadenopathy, no thyromegaly.  Lungs: Clear to auscultation bilaterally.  Cardiac: Regular rate and rhythm.  Abdomen: Soft, nontender, nondistended. There is no palpable mass or hepatosplenomegaly.  Groins: No lymphadenopathy.  Extremities: Well-healed surgical incision in her left knee. 2 cm lipoma under her right axilla, multiple skin incisions from surgical procedures on her upper extremities  Pelvic: Normal external female genitalia. Vagina is atrophic. The cervix is nulliparous. There's a physiologic discharge. There's no visible lesions. Bimanual examination the cervix is probably normal the corpus is of normal size shape  and consistency there is no adnexal masses.  Assessment/Plan:  67 year old gravida 0 with a grade 2-3 endometrioid adenocarcinoma. We discussed proceeding with a total hysterectomy bilateral salpingo-oophorectomy and appropriate surgery including pelvic and para-aortic lymphadenectomy. We discussed that we would proceed with an attempt at a minimally invasive procedure with robotic assistance. She would not be able to accomplish the procedure and a robotic fashion we'll proceed with a midline laparotomy.  Risks of the procedure including but not limited to bleeding,  infection, injury to surrounding organs, need for additional surgery, laparotomy, and thromboembolic disease were discussed the patient. Her questions regarding this were elicited to her satisfaction. We also discussed the need for her to stop taking her Naprosyn and other anti-inflammatory medication 7 days before surgery secondary to antiplatelet activities. Her surgery is tentatively scheduled for January 21.  I will ask pathology to perform testing for microsatellite instability. If it is interesting that her mother as well as her identical twin both had endometrial as well as colon cancer and now this patient he doesn't have any risk factors for endometrial cancer has developed it. She is amenable to [place] performing this testing and would be well coming to genetics consult should microsatellite instability testing revealing a potential genetic mutation.  She will come in for preoperative visit. Again her questions were elicited in answer to her satisfaction. They are ery pleased with the consultation today. Thank you for allowing [place] the opportunity to participate in  the care of this very pleasant patient. Johari Pinney A., MD 12/01/2012, 2:09 PM

## 2012-12-20 NOTE — Anesthesia Preprocedure Evaluation (Addendum)
Anesthesia Evaluation  Patient identified by MRN, date of birth, ID band Patient awake    Reviewed: Allergy & Precautions, H&P , NPO status , Patient's Chart, lab work & pertinent test results  History of Anesthesia Complications (+) PONV  Airway Mallampati: III TM Distance: <3 FB Neck ROM: Full  Mouth opening: Limited Mouth Opening  Dental No notable dental hx.    Pulmonary neg pulmonary ROS,  breath sounds clear to auscultation  Pulmonary exam normal       Cardiovascular negative cardio ROS  Rhythm:Regular Rate:Normal     Neuro/Psych negative neurological ROS  negative psych ROS   GI/Hepatic Neg liver ROS, GERD-  Medicated,  Endo/Other  H/o SLE  Renal/GU negative Renal ROS  negative genitourinary   Musculoskeletal negative musculoskeletal ROS (+)   Abdominal   Peds negative pediatric ROS (+)  Hematology negative hematology ROS (+)   Anesthesia Other Findings   Reproductive/Obstetrics negative OB ROS                          Anesthesia Physical Anesthesia Plan  ASA: II  Anesthesia Plan: General   Post-op Pain Management:    Induction: Intravenous  Airway Management Planned: Oral ETT  Additional Equipment:   Intra-op Plan:   Post-operative Plan: Extubation in OR  Informed Consent: I have reviewed the patients History and Physical, chart, labs and discussed the procedure including the risks, benefits and alternatives for the proposed anesthesia with the patient or authorized representative who has indicated his/her understanding and acceptance.   Dental advisory given  Plan Discussed with: CRNA and Surgeon  Anesthesia Plan Comments:         Anesthesia Quick Evaluation

## 2012-12-20 NOTE — Transfer of Care (Signed)
Immediate Anesthesia Transfer of Care Note  Patient: Brooke Williams  Procedure(s) Performed: Procedure(s) (LRB) with comments: ROBOTIC ASSISTED TOTAL HYSTERECTOMY WITH BILATERAL SALPINGO OOPHORECTOMY (N/A) - WITH PELVIC PARAAORTIC  ROBOTIC PELVIC AND PARA-AORTIC LYMPH NODE DISSECTION (Bilateral)  Patient Location: PACU  Anesthesia Type:General  Level of Consciousness: awake, alert , oriented and patient cooperative  Airway & Oxygen Therapy: Patient Spontanous Breathing and Patient connected to face mask oxygen  Post-op Assessment: Report given to PACU RN, Post -op Vital signs reviewed and stable and Patient moving all extremities  Post vital signs: Reviewed and stable  Complications: No apparent anesthesia complications

## 2012-12-20 NOTE — Op Note (Signed)
PATIENT: Brooke Williams DATE OF BIRTH: 06/25/1946 ENCOUNTER DATE: 12/20/2012   Preop Diagnosis: Grade 2-3 endometrioid adenocarcinoma.   Postoperative Diagnosis: same.   Surgery: Total robotic hysterectomy bilateral salpingo-oophorectomy, bilateral para-aortic and pelvic lymph node dissection.   Surgeons:  Bernita Buffy. Duard Brady, MD; Antionette Char, MD   Assistant: Telford Nab   Anesthesia: General   Estimated blood loss: Minimal  IVF: 1800 ml   Urine output: 125 ml   Complications: None   Pathology: Uterus, cervix, bilateral tubes and ovaries, bilateral para-aortic and bilateral pelvic lymph nodes to pathology. MSI testing ordered on the endometrial tumor.  Operative findings: Normal intraabdominal anatomy, uterus, cervix, bilateral tubes and ovaries normal.  Physiologic adhesions of the colon to the left pelvic sidewall.  No pathologically enlarged lymph nodes.  Procedure: The patient was identified in the preoperative holding area. Informed consent was signed on the chart. Patient was seen history was reviewed and exam was performed.   The patient was then taken to the operating room and placed in the supine position with SCD hose on. General anesthesia was then induced without difficulty. She was then placed in the dorsolithotomy position. Her arms were tucked at her side with appropriate precautions on the gel pad. Shoulder blocks were then placed in the usual fashion with appropriate precautions. A OG-tube was placed to suction. First timeout was performed to confirm the patient procedure antibiotic allergy status, estimated estimated blood loss and OR time. The perineum was then prepped in the usual fashion with Betadine. A 14 French Foley was inserted into the bladder under sterile conditions. A sterile speculum was placed in the vagina. The cervix was without lesions. The cervix was grasped with a single-tooth tenaculum. The dilator without difficulty. A ZUMI with a small Koe  ring was placed without difficulty. The abdomen was then prepped with 2 Chlor prep sponges per protocol.  Patient was then draped after the prep was dried. Second timeout was performed to confirm the above. After again confirming OG tube placement and it was to suction. A stab-wound was made in left upper quadrant 2 cm below the costal margin on the left in the midclavicular line. A 5 mm operative report was used to assure intra-abdominal placement. The abdomen was insufflated. At this point all points during the procedure the patient's intra-abdominal pressure was not increased over 15 mm of mercury. After insufflation was complete, the patient was placed in deep Trendelenburg position. 25 cm above the pubic symphysis that area was marked the camera port. Bilateral robotic ports were marked 10 cm from the midline incision at approximately 5 angle. The fourth arm was placed 2 cm above and medial to the ACIS. Under direct visualization each of the trochars was placed into the abdomen. The small bowel was folded on its mesentery to allow visualization to the pelvis. The 5 mm LUQ port was then converted to a 10/12 port under direct visualization.  After assuring adequate visualization, the robot was then docked in the usual fashion. Under direct visualization the robotic instruments replaced.   Using monopolar cautery and incision was made over the right common iliac artery extending to the level of duodenum. Retroperitoneal spaces open. The ureter was identified. It was dissected off of the cell was bellies. The fourth arm using a progress was used to deviate the ureter superiorly and laterally. The nodal bundle extending over the right common iliac was dissected using pinpoint cautery and meticulous dissection. It will was then dissected off the right side of the  aorta to the level of the duodenum. The nodal bundle was then dissected off the underlying vena cava. Hemostasis was obtained using monopolar cautery.  The nodal bundle was placed in an Endo catch bag and delivered to the 10/12 port. Using the same incision that was made over the aorta our attention was drawn to the left para-aortic lymph nodes. The peritoneum was grasped and deviated towards the patient's left side. The ureter was identified. The assistant grasper was placed to deviate the ureter laterally from the area of dissection. The nodal bundle on the patient's left side was taken from the left side of the aorta to the level of the superior mesenteric artery. The pedicles were inspected and noted to be hemostatic. The nodal bundle was delivered through the 10/12 port  The round ligament on the patient's right side was transected with monopolar cautery. The anterior and posterior leaves of the broad ligament were then taken down in the usual fashion. The ureter was identified on the medial leaf of the broad ligament. A window was made between the IP and the ureter. The IP was coagulated with bipolar cautery and transected. The posterior leaf of the broad ligament was taken down to the level of the KOH ring. The bladder flap was created using meticulous dissection and pinpoint cautery. The uterine vessels were coagulated with bipolar cautery. The uterine vessels were then transected and the C loop was created. The same procedure was performed on the patient's left side.   The pneumo-occulder in the vagina was then insufflated. The colpotomy was then created in the usual fashion. Our attention was then drawn to opening the paravesical space on her right side the perirectal space was also opened. The obturator nerve was identified. The nodal bundle extending over the external iliac artery down to the external iliac vein was taken down using sharp dissection and monopolar cautery to the circumflex iliac vein. The genitofemoral nerve was identified and spared. We continued our dissection down to the level of the obturator nerve. The nodal bundle superior to  the obturator nerve was taken. All pedicles were noted to be hemostatic the ureter was noted to be well medial of the area of dissection. The nodal bundle was then placed and an Endo catch bag. The same procedure was performed on the patient's left side.    All specimens were delivered to the vagina. The vaginal cuff was closed with a running 0 Vicryl on CT 1 suture. The abdomen and pelvis were copiously irrigated and noted to be hemostatic. The robotic instruments were removed under direct visualization as were the robotic trochars. The pneumoperitoneum was removed. The patient was then taken out of the Trendelenburg position.   Using of 0 Vicryl on a UR 6 needle the midline port port fascia was closed and in the left upper quadrant port, the subcutaneous tissues were reapproximated. The skin was closed using 4-0 Vicryl. Steri-Strips and benzoin were applied. The pneumo occluded balloon was removed from the vagina. The vagina was swabbed and noted to be hemostatic.   All instrument needle and Ray-Tec counts were correct x2. The patient tolerated the procedure well and was taken to the recovery room in stable condition. This is Cleda Mccreedy dictating an operative note on patient Brooke Williams.

## 2012-12-20 NOTE — Anesthesia Procedure Notes (Signed)
Performed by: FLYNN-COOK, Kamora Vossler A       

## 2012-12-20 NOTE — Anesthesia Postprocedure Evaluation (Signed)
  Anesthesia Post-op Note  Patient: Brooke Williams  Procedure(s) Performed: Procedure(s) (LRB): ROBOTIC ASSISTED TOTAL HYSTERECTOMY WITH BILATERAL SALPINGO OOPHORECTOMY (N/A) ROBOTIC PELVIC AND PARA-AORTIC LYMPH NODE DISSECTION (Bilateral)  Patient Location: PACU  Anesthesia Type: General  Level of Consciousness: awake and alert   Airway and Oxygen Therapy: Patient Spontanous Breathing  Post-op Pain: mild  Post-op Assessment: Post-op Vital signs reviewed, Patient's Cardiovascular Status Stable, Respiratory Function Stable, Patent Airway and No signs of Nausea or vomiting  Last Vitals:  Filed Vitals:   12/20/12 1015  BP: 120/65  Pulse: 64  Temp:   Resp: 11    Post-op Vital Signs: stable   Complications: No apparent anesthesia complications

## 2012-12-21 ENCOUNTER — Encounter (HOSPITAL_COMMUNITY): Payer: Self-pay | Admitting: Gynecologic Oncology

## 2012-12-21 LAB — BASIC METABOLIC PANEL
BUN: 9 mg/dL (ref 6–23)
CO2: 24 mEq/L (ref 19–32)
Calcium: 8.8 mg/dL (ref 8.4–10.5)
Chloride: 108 mEq/L (ref 96–112)
Creatinine, Ser: 0.62 mg/dL (ref 0.50–1.10)
GFR calc Af Amer: >90 mL/min (ref >90)
GFR calc non Af Amer: >90 mL/min (ref >90)
Glucose, Bld: 129 mg/dL — ABNORMAL HIGH (ref 70–99)
Potassium: 3.7 mEq/L (ref 3.5–5.1)
Sodium: 141 mEq/L (ref 135–145)

## 2012-12-21 LAB — CBC
HCT: 38.1 % (ref 36.0–46.0)
Hemoglobin: 12.5 g/dL (ref 12.0–15.0)
MCH: 30.6 pg (ref 26.0–34.0)
MCHC: 32.8 g/dL (ref 30.0–36.0)
MCV: 93.2 fL (ref 78.0–100.0)
Platelets: 237 10*3/uL (ref 150–400)
RBC: 4.09 MIL/uL (ref 3.87–5.11)
RDW: 13.3 % (ref 11.5–15.5)
WBC: 11.7 10*3/uL — ABNORMAL HIGH (ref 4.0–10.5)

## 2012-12-21 MED ORDER — ENOXAPARIN SODIUM 40 MG/0.4ML ~~LOC~~ SOLN
40.0000 mg | Freq: Once | SUBCUTANEOUS | Status: AC
  Administered 2012-12-21: 40 mg via SUBCUTANEOUS
  Filled 2012-12-21: qty 0.4

## 2012-12-21 MED ORDER — OXYCODONE-ACETAMINOPHEN 5-325 MG PO TABS: 1.0000 | ORAL_TABLET | ORAL | Status: DC | PRN

## 2012-12-21 NOTE — Discharge Summary (Signed)
Physician Discharge Summary  Patient ID: Brooke Williams MRN: [id number] DOB/AGE: [birth year] 65 y.o.  Admit date: 12/20/2012 Discharge date: 12/21/2012  Admission Diagnoses: Endometrial adenocarcinoma  Discharge Diagnoses:  Principal Problem:  *Endometrial adenocarcinoma  Discharged Condition: The patient is in good condition and stable for discharge.  Hospital Course: On 12/20/2012, the patient underwent the following: Procedure(s):  ROBOTIC ASSISTED TOTAL HYSTERECTOMY WITH BILATERAL SALPINGO OOPHORECTOMY, ROBOTIC PELVIC AND PARA-AORTIC LYMPH NODE DISSECTION.  The postoperative course was uneventful.  She was discharged to home on postoperative day 1 tolerating a regular diet.  Consults: None  Significant Diagnostic Studies: None  Treatments: surgery: See above  Discharge Exam: Blood pressure 90/53, pulse 69, temperature 97.7 F (36.5 C), temperature source Oral, resp. rate 14, height 5\' 6"  (1.676 m), weight 165 lb (74.844 kg), SpO2 99.00%. General appearance: alert, cooperative and no distress Resp: clear to auscultation bilaterally Cardio: regular rate and rhythm, S1, S2 normal, no murmur, click, rub or gallop GI: soft, non-tender; bowel sounds normal; no masses,  no organomegaly Extremities: extremities normal, atraumatic, no cyanosis or edema Incision/Wound: Lap sites x5 with steri strips clean, dry, and intact  Disposition: 01-Home or Self Care  Discharge Orders    Future Orders Please Complete By Expires   Diet - low sodium heart healthy      Increase activity slowly      Driving Restrictions      Comments:   No driving for 1 week.  Do not take narcotics and drive.   Lifting restrictions      Comments:   No lifting greater than 10 lbs.   Sexual Activity Restrictions      Comments:   No sexual activity, nothing in the vagina, for 8 weeks.   Call MD for:  temperature >100.4      Call MD for:  persistant nausea and vomiting      Call MD for:  severe uncontrolled  pain      Call MD for:  redness, tenderness, or signs of infection (pain, swelling, redness, odor or green/yellow discharge around incision site)      Call MD for:  difficulty breathing, headache or visual disturbances      Call MD for:  hives      Call MD for:  persistant dizziness or light-headedness      Call MD for:  extreme fatigue          Medication List     As of 12/21/2012  9:05 AM    TAKE these medications         CALCIUM PO   Take 1,000 mg by mouth daily.      CLARITIN 10 MG tablet   Generic drug: loratadine   Take 10 mg by mouth every morning.      CO Q 10 PO   Take 300 mg by mouth daily.      fish oil-omega-3 fatty acids 1000 MG capsule   Take 1 g by mouth daily.      methocarbamol 500 MG tablet   Commonly known as: ROBAXIN   Take 500 mg by mouth 2 (two) times daily as needed. For muscle spasms      multivitamin with minerals Tabs   Take 1 tablet by mouth daily.      naproxen 500 MG tablet   Commonly known as: NAPROSYN   Take 500 mg by mouth 2 (two) times daily with a meal.      oxyCODONE-acetaminophen 5-325 MG per tablet   Commonly  known as: PERCOCET/ROXICET   Take 1-2 tablets by mouth every 4 (four) hours as needed (moderate to severe pain).      psyllium 58.6 % powder   Commonly known as: METAMUCIL   Take 1 packet by mouth daily as needed. For fiber      ranitidine 150 MG tablet   Commonly known as: ZANTAC   Take 150 mg by mouth daily.      VITAMIN D-3 PO   Take 1 tablet by mouth daily. 2000 IU           Follow-up Information    Please follow up. (GYN Oncology will call you with your final pathology results and to arrange a follow up appointment.)          Signed: Eleora Sutherland DEAL 12/21/2012, 9:05 AM

## 2012-12-21 NOTE — Progress Notes (Signed)
Discharge summary sent to payer through [facility]  

## 2012-12-31 ENCOUNTER — Telehealth: Payer: Self-pay | Admitting: *Deleted

## 2012-12-31 NOTE — Telephone Encounter (Signed)
Patient notified of Path results.  F/U appt 01/04/13 1115

## 2013-01-04 ENCOUNTER — Encounter: Payer: Self-pay | Admitting: Gynecologic Oncology

## 2013-01-04 ENCOUNTER — Ambulatory Visit: Payer: BC Managed Care – PPO | Attending: Gynecologic Oncology | Admitting: Gynecologic Oncology

## 2013-01-04 VITALS — BP 118/80 | HR 68 | Temp 98.5°F | Resp 18 | Ht 66.3 in | Wt 168.1 lb

## 2013-01-04 DIAGNOSIS — Z79899 Other long term (current) drug therapy: Secondary | ICD-10-CM | POA: Insufficient documentation

## 2013-01-04 DIAGNOSIS — K219 Gastro-esophageal reflux disease without esophagitis: Secondary | ICD-10-CM | POA: Insufficient documentation

## 2013-01-04 DIAGNOSIS — C549 Malignant neoplasm of corpus uteri, unspecified: Secondary | ICD-10-CM | POA: Insufficient documentation

## 2013-01-04 DIAGNOSIS — C541 Malignant neoplasm of endometrium: Secondary | ICD-10-CM

## 2013-01-04 NOTE — Patient Instructions (Signed)
Return for post op visit

## 2013-01-04 NOTE — Progress Notes (Signed)
Consult Note: Gyn-Onc  Brooke Williams 67 y.o. female  CC:  Chief Complaint  Patient presents with  . Endometrial cancer    Follow up    HPI:  Patient is a 67 year old gravida 0 who went through menopause at the age of 67. Since that time she's been maintained on hormone replacement therapy. She her husband right now in an Thanksgiving she began hemorrhaging with clots. 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 She up subsequently underwent surgery. She underwent a total robotic hysterectomy BSO bilateral para-aortic and pelvic lymph node dissection on January 21. Operative findings included normal intra-abdominal anatomy. The uterus, cervix, bilateral adnexa and lymph nodes were normal. She had some physiologic adhesions of the colon to the left pelvic sidewall. She comes in today for discussion of pathology.  1. Lymph node, biopsy, right para aortic - SIX LYMPH NODES, NEGATIVE FOR METASTATIC CARCINOMA (0/6). 2. Lymph node, biopsy, left para aortic - FIVE LYMPH NODES, NEGATIVE FOR METASTATIC CARCINOMA (0/5). 3. Uterus, ovaries and fallopian tubes, with cervix - SUPERFICIALLY INVASIVE ENDOMETRIOID CARCINOMA, FIGO GRADE III, CONFINED WITHIN INNER HALF OF THE MYOMETRIUM; NO EVIDENCE OF ANGIOLYMPHATIC INVASION IDENTIFIED. - LEIOMYOMA. - CERVIX: BENIGN SQUAMOUS MUCOSA AND ENDOCERVICAL MUCOSA, NO DYSPLASIA OR MALIGNANCY. - BILATERAL OVARIES AND FALLOPIAN TUBES: NO PATHOLOGIC ABNORMALITIES. 4. Lymph node, biopsy, right pelvic - FOUR LYMPH NODES, NEGATIVE FOR METASTATIC CARCINOMA (0/4). 5. Lymph node, biopsy, left pelvic - SEVEN LYMPH NODES, NEGATIVE FOR METASTATIC CARCINOMA (0/7).  Interval History:  She's been doing very well postoperatively. She's not required any narcotic pain medication. She is having fairly significant vasomotor symptoms. She's had normal return of bowel and bladder function. She has no bleeding.  Review of Systems  Current Meds:  Outpatient Encounter  Prescriptions as of 01/04/2013  Medication Sig Dispense Refill  . CALCIUM PO Take 1,000 mg by mouth daily.       . Cholecalciferol (VITAMIN D-3 PO) Take 1 tablet by mouth daily. 2000 IU      . Coenzyme Q10 (CO Q 10 PO) Take 300 mg by mouth daily.      . fish oil-omega-3 fatty acids 1000 MG capsule Take 1 g by mouth daily.      Marland Kitchen loratadine (CLARITIN) 10 MG tablet Take 10 mg by mouth every morning.       . methocarbamol (ROBAXIN) 500 MG tablet Take 500 mg by mouth 2 (two) times daily as needed. For muscle spasms      . Multiple Vitamin (MULTIVITAMIN WITH MINERALS) TABS Take 1 tablet by mouth daily.      . naproxen (NAPROSYN) 500 MG tablet Take 500 mg by mouth 2 (two) times daily with a meal.      . oxyCODONE-acetaminophen (PERCOCET/ROXICET) 5-325 MG per tablet Take 1-2 tablets by mouth every 4 (four) hours as needed (moderate to severe pain).  50 tablet  0  . psyllium (METAMUCIL) 58.6 % powder Take 1 packet by mouth daily as needed. For fiber      . ranitidine (ZANTAC) 150 MG tablet Take 150 mg by mouth daily.        Allergy:  Allergies  Allergen Reactions  . Penicillins Hives    Social Hx:   History   Social History  . Marital Status: Married    Spouse Name: N/A    Number of Children: N/A  . Years of Education: N/A   Occupational History  . Not on file.   Social History Main Topics  . Smoking status: Never Smoker   .  Smokeless tobacco: Not on file  . Alcohol Use: Yes     Comment: occassional  . Drug Use: No  . Sexually Active: Not Currently    Birth Control/ Protection: Post-menopausal   Other Topics Concern  . Not on file   Social History Narrative  . No narrative on file    Past Surgical Hx:  Past Surgical History  Procedure Date  . Medial partial knee replacement     LT knee  . Other surgical history     metal pin/screw to repair arthritis in LT thumb joint  . Hand surgery     left thumb surgery  . Dilation and curettage of uterus     2008-polyp removed  .  Robotic assisted total hysterectomy with bilateral salpingo oopherectomy 12/20/2012    Procedure: ROBOTIC ASSISTED TOTAL HYSTERECTOMY WITH BILATERAL SALPINGO OOPHORECTOMY;  Surgeon: Rejeana Brock A. Duard Brady, MD;  Location: [facility];  Service: Gynecology;  Laterality: N/A;  WITH PELVIC PARAAORTIC   . Robotic pelvic and para-aortic lymph node dissection 12/20/2012    Procedure: ROBOTIC PELVIC AND PARA-AORTIC LYMPH NODE DISSECTION;  Surgeon: Rejeana Brock A. Duard Brady, MD;  Location: [facility];  Service: Gynecology;  Laterality: Bilateral;    Past Medical Hx:  Past Medical History  Diagnosis Date  . Lupus     in remission for 5-6 yrs, not on meds now.(more skin reactions,joint pain, sores in mouth)  . Endometrial cancer   . PONV (postoperative nausea and vomiting)   . Seasonal allergies   . GERD (gastroesophageal reflux disease)     controls with Zantac  . Headache     hx. migraines  . Arthritis     arthritis-hands. cervical disc degeneration    Family Hx:  Family History  Problem Relation Age of Onset  . Endometriosis Mother   . Stroke Mother 59    arterial wall of artery in eye   . Dementia Father   . Stroke Father     "lots of many strokes"  . Aneurysm Father   . Multiple births Maternal Grandmother     died 5 days after delivery of unknown causes    Vitals:  Blood pressure 118/80, pulse 68, temperature 98.5 F (36.9 C), resp. rate 18, height 5' 6.3" (1.684 m), weight 168 lb 1.6 oz (76.25 kg).  Physical Exam: Well-nourished well-developed female in no acute distress.  Abdomen: Well-healed surgical incisions. Abdomen is soft and nontender.  Assessment/Plan: 67 year old with stage IA grade 3 endometrioid adenocarcinoma. 0/22 lymph nodes were involved. She had no lymphovascular space involvement. Her tumor involved only 0.3 cm with the myometrium was 1.5 cm in thickness. I reviewed with her different risk factors including the gynecologic oncology group criteria for adjuvant radiation therapy. She  does not meet criteria for adjuvant radiation therapy. She can be dispositioned to close followup. She will restart her Climara patch. Return to see me in 4 weeks for a postoperative visit. She was provided a copy of her pathology report.  Arabela Basaldua A., MD 01/04/2013, 11:55 AM

## 2013-01-14 ENCOUNTER — Other Ambulatory Visit: Payer: Self-pay

## 2013-02-09 ENCOUNTER — Ambulatory Visit: Payer: BC Managed Care – PPO | Attending: Gynecologic Oncology | Admitting: Gynecologic Oncology

## 2013-02-09 ENCOUNTER — Encounter: Payer: Self-pay | Admitting: Gynecologic Oncology

## 2013-02-09 VITALS — BP 130/84 | HR 96 | Temp 98.4°F | Resp 18 | Ht 66.3 in | Wt 170.0 lb

## 2013-02-09 DIAGNOSIS — Z79899 Other long term (current) drug therapy: Secondary | ICD-10-CM | POA: Insufficient documentation

## 2013-02-09 DIAGNOSIS — K219 Gastro-esophageal reflux disease without esophagitis: Secondary | ICD-10-CM | POA: Insufficient documentation

## 2013-02-09 DIAGNOSIS — M329 Systemic lupus erythematosus, unspecified: Secondary | ICD-10-CM | POA: Insufficient documentation

## 2013-02-09 DIAGNOSIS — C541 Malignant neoplasm of endometrium: Secondary | ICD-10-CM

## 2013-02-09 DIAGNOSIS — C549 Malignant neoplasm of corpus uteri, unspecified: Secondary | ICD-10-CM | POA: Insufficient documentation

## 2013-02-09 NOTE — Progress Notes (Signed)
Consult Note: Gyn-Onc  Brooke Williams 67 y.o. female  CC:  Chief Complaint  Patient presents with  . Endometrial ca    Follow up    HPI: Patient is a 67 year old gravida 0 who went through menopause at the age of 71. Since that time she's been maintained on hormone replacement therapy. She her husband right now in an Thanksgiving she began hemorrhaging with clots. 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 She up subsequently underwent surgery. She underwent a total robotic hysterectomy BSO bilateral para-aortic and pelvic lymph node dissection on January 21. Operative findings included normal intra-abdominal anatomy. The uterus, cervix, bilateral adnexa and lymph nodes were normal. She had some physiologic adhesions of the colon to the left pelvic sidewall. She comes in today for discussion of pathology.  1. Lymph node, biopsy, right para aortic  - SIX LYMPH NODES, NEGATIVE FOR METASTATIC CARCINOMA (0/6).  2. Lymph node, biopsy, left para aortic  - FIVE LYMPH NODES, NEGATIVE FOR METASTATIC CARCINOMA (0/5).  3. Uterus, ovaries and fallopian tubes, with cervix  - SUPERFICIALLY INVASIVE ENDOMETRIOID CARCINOMA, FIGO GRADE III, CONFINED WITHIN  INNER HALF OF THE MYOMETRIUM; NO EVIDENCE OF ANGIOLYMPHATIC INVASION IDENTIFIED.  - LEIOMYOMA.  - CERVIX: BENIGN SQUAMOUS MUCOSA AND ENDOCERVICAL MUCOSA, NO DYSPLASIA OR  MALIGNANCY.  - BILATERAL OVARIES AND FALLOPIAN TUBES: NO PATHOLOGIC ABNORMALITIES.  4. Lymph node, biopsy, right pelvic  - FOUR LYMPH NODES, NEGATIVE FOR METASTATIC CARCINOMA (0/4).  5. Lymph node, biopsy, left pelvic  - SEVEN LYMPH NODES, NEGATIVE FOR METASTATIC CARCINOMA (0/7).   Interval History:  She's been doing very well postoperatively. She's not required any narcotic pain medication. She is having no vasomotor symptoms on low-dose Climara patch. Microsatellite instability testing was not done on her endometrial cancer and that was ordered for her today. She is  walking about 20 minutes per day. She's had no bleeding. She feels that she is completely recovered from her surgery.    Current Meds:  Outpatient Encounter Prescriptions as of 02/09/2013  Medication Sig Dispense Refill  . CALCIUM PO Take 1,000 mg by mouth daily.       . Cholecalciferol (VITAMIN D-3 PO) Take 1 tablet by mouth daily. 2000 IU      . Coenzyme Q10 (CO Q 10 PO) Take 300 mg by mouth daily.      Marland Kitchen estradiol (CLIMARA - DOSED IN MG/24 HR) 0.0375 mg/24hr Place 1 patch onto the skin once a week.      . fish oil-omega-3 fatty acids 1000 MG capsule Take 1 g by mouth daily.      Marland Kitchen loratadine-pseudoephedrine (CLARITIN-D 12-HOUR) 5-120 MG per tablet Take 1 tablet by mouth daily.      . Multiple Vitamin (MULTIVITAMIN WITH MINERALS) TABS Take 1 tablet by mouth daily.      . psyllium (METAMUCIL) 58.6 % powder Take 1 packet by mouth daily as needed. For fiber      . ranitidine (ZANTAC) 150 MG tablet Take 150 mg by mouth daily.      Marland Kitchen loratadine (CLARITIN) 10 MG tablet Take 10 mg by mouth every morning.       . methocarbamol (ROBAXIN) 500 MG tablet Take 500 mg by mouth 2 (two) times daily as needed. For muscle spasms      . naproxen (NAPROSYN) 500 MG tablet Take 500 mg by mouth 2 (two) times daily with a meal.      . oxyCODONE-acetaminophen (PERCOCET/ROXICET) 5-325 MG per tablet Take 1-2 tablets by mouth every 4 (  four) hours as needed (moderate to severe pain).  50 tablet  0   No facility-administered encounter medications on file as of 02/09/2013.    Allergy:  Allergies  Allergen Reactions  . Penicillins Hives    Social Hx:   History   Social History  . Marital Status: Married    Spouse Name: N/A    Number of Children: N/A  . Years of Education: N/A   Occupational History  . Not on file.   Social History Main Topics  . Smoking status: Never Smoker   . Smokeless tobacco: Not on file  . Alcohol Use: Yes     Comment: occassional  . Drug Use: No  . Sexually Active: Not  Currently    Birth Control/ Protection: Post-menopausal   Other Topics Concern  . Not on file   Social History Narrative  . No narrative on file    Past Surgical Hx:  Past Surgical History  Procedure Laterality Date  . Medial partial knee replacement      LT knee  . Other surgical history      metal pin/screw to repair arthritis in LT thumb joint  . Hand surgery      left thumb surgery  . Dilation and curettage of uterus      2008-polyp removed  . Robotic assisted total hysterectomy with bilateral salpingo oopherectomy  12/20/2012    Procedure: ROBOTIC ASSISTED TOTAL HYSTERECTOMY WITH BILATERAL SALPINGO OOPHORECTOMY;  Surgeon: Rejeana Brock A. Duard Brady, MD;  Location: [facility];  Service: Gynecology;  Laterality: N/A;  WITH PELVIC PARAAORTIC   . Robotic pelvic and para-aortic lymph node dissection  12/20/2012    Procedure: ROBOTIC PELVIC AND PARA-AORTIC LYMPH NODE DISSECTION;  Surgeon: Rejeana Brock A. Duard Brady, MD;  Location: [facility];  Service: Gynecology;  Laterality: Bilateral;    Past Medical Hx:  Past Medical History  Diagnosis Date  . Lupus     in remission for 5-6 yrs, not on meds now.(more skin reactions,joint pain, sores in mouth)  . Endometrial cancer   . PONV (postoperative nausea and vomiting)   . Seasonal allergies   . GERD (gastroesophageal reflux disease)     controls with Zantac  . Headache     hx. migraines  . Arthritis     arthritis-hands. cervical disc degeneration    Family Hx:  Family History  Problem Relation Age of Onset  . Stroke Mother 75    arterial wall of artery in eye   . Endometrial cancer Mother   . Dementia Father   . Stroke Father     "lots of many strokes"  . Aneurysm Father   . Multiple births Maternal Grandmother     died 5 days after delivery of unknown causes  . Colon cancer Maternal Aunt     Vitals:  Blood pressure 130/84, pulse 96, temperature 98.4 F (36.9 C), resp. rate 18, height 5' 6.3" (1.684 m), weight 170 lb (77.111 kg).  Physical  Exam: Well-nourished well-developed female in no acute distress.   Abdomen: Well-healed surgical incisions. Abdomen is soft and nontender.   Pelvic: Normal external female genitalia. Vagina is slightly atrophic. The vaginal cuff is healed and intact. There is no nodularity. There is no fluctuance. There is no tenderness.   Assessment/Plan:  67 year old with stage IA grade 3 endometrioid adenocarcinoma. 0/22 lymph nodes were involved. She had no lymphovascular space involvement. Her tumor involved only 0.3 cm with the myometrium was 1.5 cm in thickness. I reviewed with her different risk factors  including the gynecologic oncology group criteria for adjuvant radiation therapy. She does not meet criteria for adjuvant radiation therapy. She can be dispositioned to close followup.   She was given a lower dose Climara patch 0.0-5 prescription that she'll take after she runs out of her 0.30375 prescription. We'll contact her with the results for microsatellite instability testing. Return to see me in 3-4 months. We'll then begin alternating visits with Dr. Billy Coast.    Ridley Dileo A., MD 02/09/2013, 1:04 PM

## 2013-02-09 NOTE — Patient Instructions (Signed)
Return to see Dr. Duard Brady in 3 months.

## 2013-02-22 ENCOUNTER — Telehealth: Payer: Self-pay | Admitting: Gynecologic Oncology

## 2013-02-22 NOTE — Telephone Encounter (Signed)
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 She will be referred for genetics consultation and testing for Lynch Syndrome.We will schedule her appointment with genetics and notify her of the date and time.

## 2013-02-23 ENCOUNTER — Other Ambulatory Visit: Payer: Self-pay | Admitting: Gynecologic Oncology

## 2013-02-23 DIAGNOSIS — C541 Malignant neoplasm of endometrium: Secondary | ICD-10-CM

## 2013-02-24 ENCOUNTER — Telehealth: Payer: Self-pay | Admitting: Genetic Counselor

## 2013-02-24 NOTE — Telephone Encounter (Signed)
S/W PT IN RE TO GENETIC APPT 06/12 @ 11 W/KAREN POWELL.  WELCOME PACKET MAILED.

## 2013-03-03 ENCOUNTER — Encounter: Payer: Self-pay | Admitting: *Deleted

## 2013-03-03 NOTE — Progress Notes (Signed)
Request for genetics counseling as requested by Dr Duard Brady given to [place] to schedule

## 2013-05-11 ENCOUNTER — Ambulatory Visit: Payer: BC Managed Care – PPO | Attending: Gynecologic Oncology | Admitting: Gynecologic Oncology

## 2013-05-11 ENCOUNTER — Encounter: Payer: Self-pay | Admitting: Gynecologic Oncology

## 2013-05-11 ENCOUNTER — Encounter: Payer: Self-pay | Admitting: Genetic Counselor

## 2013-05-11 ENCOUNTER — Other Ambulatory Visit: Payer: BC Managed Care – PPO | Admitting: Lab

## 2013-05-11 ENCOUNTER — Ambulatory Visit (HOSPITAL_BASED_OUTPATIENT_CLINIC_OR_DEPARTMENT_OTHER): Payer: BC Managed Care – PPO | Admitting: Genetic Counselor

## 2013-05-11 VITALS — BP 108/78 | HR 64 | Temp 98.5°F | Resp 16 | Ht 66.3 in | Wt 173.9 lb

## 2013-05-11 DIAGNOSIS — C541 Malignant neoplasm of endometrium: Secondary | ICD-10-CM

## 2013-05-11 DIAGNOSIS — Z90710 Acquired absence of both cervix and uterus: Secondary | ICD-10-CM | POA: Insufficient documentation

## 2013-05-11 DIAGNOSIS — Z800 Family history of malignant neoplasm of digestive organs: Secondary | ICD-10-CM

## 2013-05-11 DIAGNOSIS — Z8049 Family history of malignant neoplasm of other genital organs: Secondary | ICD-10-CM

## 2013-05-11 DIAGNOSIS — Z86010 Personal history of colonic polyps: Secondary | ICD-10-CM

## 2013-05-11 DIAGNOSIS — Z9079 Acquired absence of other genital organ(s): Secondary | ICD-10-CM | POA: Insufficient documentation

## 2013-05-11 DIAGNOSIS — C549 Malignant neoplasm of corpus uteri, unspecified: Secondary | ICD-10-CM | POA: Insufficient documentation

## 2013-05-11 DIAGNOSIS — K219 Gastro-esophageal reflux disease without esophagitis: Secondary | ICD-10-CM | POA: Insufficient documentation

## 2013-05-11 DIAGNOSIS — Z79890 Hormone replacement therapy (postmenopausal): Secondary | ICD-10-CM | POA: Insufficient documentation

## 2013-05-11 DIAGNOSIS — R5381 Other malaise: Secondary | ICD-10-CM | POA: Insufficient documentation

## 2013-05-11 NOTE — Patient Instructions (Signed)
Genetic Counseling Genetic counseling helps people look at the risks of genetic problems in pregnancy. Genetic problems are problems that may be passed from the mother and father to their child.  Genetic counseling is done:  To see if a couple is at risk of passing on a problem.  To explain the cause of a disorder if present.  To understand the odds passing it on to the baby.  To determine what can be done medically.  To use in family planning to prevent or avoid a problem. Genetic counseling can be done by:  A geneticist.  A physician with special training in genetics.  Genetic counselors. The following are reasons to seek a referral for genetic counseling and/or genetic evaluation with a genetic physician: FAMILY HISTORY FACTORS   Mental retardation.  Neural tube defects (such as spina bifida).  Chromosome abnormalities (such as Down syndrome, Fragile X syndrome).  Cleft lip/cleft palate.  Heart defects.  Short stature.  Single gene defects (such as cystic fibrosis or PKU).  Hearing or visual impairments.  Learning disabilities.  Psychiatric disorders.  Cancers (breast, uterus, ovary and intestine).  Multiple pregnancy losses (miscarriages, stillbirths, or infant deaths).  Other disorders which could be considered genetic.  Either parent with a dominant disorder or any disorder seen in several generations including:  High blood pressure.  High cholesterol.  Muscular dystrophy.  Huntington's chorea.  Polycystic kidney. IF BOTH PARENTS ARE CARRIERS FOR:   Depression.  Seizures (convulsions).  Alcoholism.  Diabetes.  Thyroid disorder.  Others in which birth defects may be associated either with the disease process or with common medications prescribed for the disease.  Fetal or parental exposure to potentially harmful agents. These include:  Drugs.  Chemicals.  Infection.  Radiation.  Infertility cases where either parent is suspected  of having a chromosome abnormality.  Couples requiring assisted reproductive techniques to achieve a pregnancy, or individuals donating eggs or sperm for those purposes. OTHER FACTORS   Persons in specific ethnic groups or geographic areas with a higher incidence of certain disorders. These include Tay Sachs disease, sickle cell disease, or thalassemia.  Extreme parental concern or fear of having a child with a birth defect.  Parents are blood relatives (consanguinity) or incest in a pregnancy is involved.  Premarital or preconception counseling in couples at high risk for genetic disorders based on family or personal medical history.  The birth of an affected child or by carrier screening.  Mother, known, or presumed carrier of an X-linked disorder such as hemophilia.  Either parent is a known carrier of a balanced chromosome abnormality.  A previous baby with a genetic disorder.  After the baby is born, you find out the baby has a genetic disorder. PREGNANCY FACTORS   The mother is over 35 years old a the time of delivery.  Maternal serum screening indicating an increased risk for neural tube defects, Down Syndrome, or trisomy 18.  Abnormal prenatal diagnostic test results or abnormal prenatal ultrasound examination.  Maternal factors such as:  Schizophrenia.  Seizures.  Depression.  Alcoholism.  Diabetes.  Thyroid disorder.  Other factors in which birth defects may be associated either with the disease process or with common medications prescribed for the disease.  Fetal or parental exposure to potentially harmful agents such as:  Drugs.  Chemicals.  Radiation.  Infection.  The father is over the age of 55 years at the time of conception.  Infertility cases where either parent is suspected of having a chromosome abnormality.    Couples requiring assisted reproductive techniques to achieve a pregnancy, or individuals donating eggs or sperm for those  purposes.  Recurrent pregnancy loss.  Stillborn baby. FINDING A GENETIC COUNSELOR Most caregivers know and have a list of genetic counselors. Your caregiver can refer you to one of them. You can also contact the [employer] for their locations and for more information.  AFTER RECEIVING GENETIC COUNSELING Genetic counselors can help you understand what options you have. They can help you understand what to do next. They can share the experiences of other couples who had babies with genetic problems. If you are at high risk for having a baby with a severe or fatal genetic disorder, you have several options:  Have in vitro fertilization with healthy eggs that were fertilized in vitro.  Use donor sperm or eggs.  Adopt a baby. Genetic counselors can also help if you find out when you are pregnant that the baby has a severe or fatal genetic disorder. Some options are:  Help you prepare for a particular disorder by speaking to:  Specialists in caring for Down syndrome.  Surgeon for heart defects.  Social workers.  Support groups.  Fetal surgery is sometimes possible and helpful to treat some genetic defects.  Ending the pregnancy. Document Released: 02/06/2003 Document Revised: 02/08/2012 Document Reviewed: 12/28/2007 [facility] Patient Information 2014 [place], [place].

## 2013-05-11 NOTE — Progress Notes (Signed)
Consult Note: Gyn-Onc  Brooke Williams 67 y.o. female  CC:  Chief Complaint  Patient presents with  . Endometrial cancer    Follow up    HPI: Patient is a 67 year old gravida 0 who went through menopause at the age of 71. Since that time she's been maintained on hormone replacement therapy. During Thanksgiving she began hemorrhaging with clots. 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 She up subsequently underwent surgery. She underwent a total robotic hysterectomy BSO bilateral para-aortic and pelvic lymph node dissection on January 21. Operative findings included normal intra-abdominal anatomy. The uterus, cervix, bilateral adnexa and lymph nodes were normal. She had some physiologic adhesions of the colon to the left pelvic sidewall.  Pathology revealed:  1. Lymph node, biopsy, right para aortic  - SIX LYMPH NODES, NEGATIVE FOR METASTATIC CARCINOMA (0/6).  2. Lymph node, biopsy, left para aortic  - FIVE LYMPH NODES, NEGATIVE FOR METASTATIC CARCINOMA (0/5).  3. Uterus, ovaries and fallopian tubes, with cervix  - SUPERFICIALLY INVASIVE ENDOMETRIOID CARCINOMA, FIGO GRADE III, CONFINED WITHIN  INNER HALF OF THE MYOMETRIUM; NO EVIDENCE OF ANGIOLYMPHATIC INVASION IDENTIFIED.  - LEIOMYOMA.  - CERVIX: BENIGN SQUAMOUS MUCOSA AND ENDOCERVICAL MUCOSA, NO DYSPLASIA OR  MALIGNANCY.  - BILATERAL OVARIES AND FALLOPIAN TUBES: NO PATHOLOGIC ABNORMALITIES.  4. Lymph node, biopsy, right pelvic  - FOUR LYMPH NODES, NEGATIVE FOR METASTATIC CARCINOMA (0/4).  5. Lymph node, biopsy, left pelvic  - SEVEN LYMPH NODES, NEGATIVE FOR METASTATIC CARCINOMA (0/7).   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 She was given these results by phone and I scheduled her to see genetics.  Interval History:  She is overall doing well. She is seeing genetics today at 11:00. She tried to lower dose patch however the size was quite large and she cannot care for it. She is aspirating out the MAC her  bowel patch 0.0375 and wearing her for a week at a time and has not really having any vasomotor symptoms. She does complain of some slight abdominal tenderness at the end of the day particularly she's been wearing genes all day. She does complain of being tired at the end of the day at about 4:00 and this has been ongoing since her surgery. She's able to do the same activities as she was before her surgery but feels a little bit more fatigued. She denies any change in her bowel or bladder habits. She's not having any hot flashes. She denies any vaginal bleeding abdominal or pelvic pain.  Current Meds:  Outpatient Encounter Prescriptions as of 05/11/2013  Medication Sig Dispense Refill  . CALCIUM PO Take 1,000 mg by mouth daily.       . Cholecalciferol (VITAMIN D-3 PO) Take 1 tablet by mouth daily. 2000 IU      . Coenzyme Q10 (CO Q 10 PO) Take 300 mg by mouth daily.      Marland Kitchen estradiol (CLIMARA - DOSED IN MG/24 HR) 0.0375 mg/24hr Place 1 patch onto the skin once a week.      . fish oil-omega-3 fatty acids 1000 MG capsule Take 1 g by mouth daily.      Marland Kitchen loratadine (CLARITIN) 10 MG tablet Take 10 mg by mouth every morning.       . loratadine-pseudoephedrine (CLARITIN-D 12-HOUR) 5-120 MG per tablet Take 1 tablet by mouth daily.      . methocarbamol (ROBAXIN) 500 MG tablet Take 500 mg by mouth 2 (two) times daily as needed. For muscle spasms      .  Multiple Vitamin (MULTIVITAMIN WITH MINERALS) TABS Take 1 tablet by mouth daily.      . naproxen (NAPROSYN) 500 MG tablet Take 500 mg by mouth 2 (two) times daily with a meal.      . oxyCODONE-acetaminophen (PERCOCET/ROXICET) 5-325 MG per tablet Take 1-2 tablets by mouth every 4 (four) hours as needed (moderate to severe pain).  50 tablet  0  . psyllium (METAMUCIL) 58.6 % powder Take 1 packet by mouth daily as needed. For fiber      . ranitidine (ZANTAC) 150 MG tablet Take 150 mg by mouth daily.       No facility-administered encounter medications on file as of  05/11/2013.    Allergy:  Allergies  Allergen Reactions  . Penicillins Hives    Social Hx:   History   Social History  . Marital Status: Married    Spouse Name: N/A    Number of Children: N/A  . Years of Education: N/A   Occupational History  . Not on file.   Social History Main Topics  . Smoking status: Never Smoker   . Smokeless tobacco: Not on file  . Alcohol Use: Yes     Comment: occassional  . Drug Use: No  . Sexually Active: Not Currently    Birth Control/ Protection: Post-menopausal   Other Topics Concern  . Not on file   Social History Narrative  . No narrative on file    Past Surgical Hx:  Past Surgical History  Procedure Laterality Date  . Medial partial knee replacement      LT knee  . Other surgical history      metal pin/screw to repair arthritis in LT thumb joint  . Hand surgery      left thumb surgery  . Dilation and curettage of uterus      2008-polyp removed  . Robotic assisted total hysterectomy with bilateral salpingo oopherectomy  12/20/2012    Procedure: ROBOTIC ASSISTED TOTAL HYSTERECTOMY WITH BILATERAL SALPINGO OOPHORECTOMY;  Surgeon: Rejeana Brock A. Duard Brady, MD;  Location: [facility];  Service: Gynecology;  Laterality: N/A;  WITH PELVIC PARAAORTIC   . Robotic pelvic and para-aortic lymph node dissection  12/20/2012    Procedure: ROBOTIC PELVIC AND PARA-AORTIC LYMPH NODE DISSECTION;  Surgeon: Rejeana Brock A. Duard Brady, MD;  Location: [facility];  Service: Gynecology;  Laterality: Bilateral;    Past Medical Hx:  Past Medical History  Diagnosis Date  . Lupus     in remission for 5-6 yrs, not on meds now.(more skin reactions,joint pain, sores in mouth)  . Endometrial cancer   . PONV (postoperative nausea and vomiting)   . Seasonal allergies   . GERD (gastroesophageal reflux disease)     controls with Zantac  . Headache(784.0)     hx. migraines  . Arthritis     arthritis-hands. cervical disc degeneration    Family Hx:  Family History  Problem Relation Age  of Onset  . Stroke Mother 52    arterial wall of artery in eye   . Endometrial cancer Mother   . Dementia Father   . Stroke Father     "lots of many strokes"  . Aneurysm Father   . Multiple births Maternal Grandmother     died 5 days after delivery of unknown causes  . Colon cancer Maternal Aunt     Vitals:  Blood pressure 108/78, pulse 64, temperature 98.5 F (36.9 C), temperature source Oral, resp. rate 16, height 5' 6.3" (1.684 m), weight 173 lb 14.4 oz (  78.881 kg).  Physical Exam: Well-nourished well-developed female in no acute distress.   Neck: Supple, no lymphadenopathy, no thyromegaly.  Lungs: Clear to auscultation bilaterally.  Cardiac Astro: Regular rate and rhythm.  Abdomen: Well-healed surgical incisions. Abdomen is soft and nontender.   Groins: No lymphadenopathy.  Extremities: No edema  Pelvic: Normal external female genitalia. Vagina is slightly atrophic. The vaginal cuff is healed and intact. There is no nodularity. There is no fluctuance. There is no tenderness. Rectal confirms   Assessment/Plan:  67 year old with stage IA grade 3 endometrioid adenocarcinoma. 0/22 lymph nodes were involved. She had no lymphovascular space involvement. Her tumor involved only 0.3 cm with the myometrium was 1.5 cm in thickness. I reviewed with her different risk factors including the gynecologic oncology group criteria for adjuvant radiation therapy. She does not meet criteria for adjuvant radiation therapy. She can be dispositioned to close followup. She is seeing a genetic after her appointment with [place] today. She will see Dr. Billy Coast in 6 months and return to see me in 1 year.     Savier Trickett A., MD 05/11/2013, 10:22 AM

## 2013-05-11 NOTE — Progress Notes (Signed)
Dr.  Owens Shark requested a consultation for genetic counseling and risk assessment for Brooke Williams, a 67 y.o. female, for discussion of her personal history of endometrial cancer and family history of endometrial and colon cancer.  She presents to clinic today to discuss the possibility of a genetic predisposition to cancer, and to further clarify her risks, as well as her family members' risks for cancer.   HISTORY OF PRESENT ILLNESS: In 2014, at the age of 67, Margy Sumler was diagnosed with endometrial cancer. This was treated with TAH/BSO.  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  Methylation studies were not performed.  The patient is on a colonoscopy schedule of every 3 years because a few years back she had multiple polyps (about 5-6).    Past Medical History  Diagnosis Date  . Lupus     in remission for 5-6 yrs, not on meds now.(more skin reactions,joint pain, sores in mouth)  . PONV (postoperative nausea and vomiting)   . Seasonal allergies   . GERD (gastroesophageal reflux disease)     controls with Zantac  . Headache(784.0)     hx. migraines  . Arthritis     arthritis-hands. cervical disc degeneration  . Endometrial cancer 2014    Past Surgical History  Procedure Laterality Date  . Medial partial knee replacement      LT knee  . Other surgical history      metal pin/screw to repair arthritis in LT thumb joint  . Hand surgery      left thumb surgery  . Dilation and curettage of uterus      2008-polyp removed  . Robotic assisted total hysterectomy with bilateral salpingo oopherectomy  12/20/2012    Procedure: ROBOTIC ASSISTED TOTAL HYSTERECTOMY WITH BILATERAL SALPINGO OOPHORECTOMY;  Surgeon: Rejeana Brock A. Duard Brady, MD;  Location: [facility];  Service: Gynecology;  Laterality: N/A;  WITH PELVIC PARAAORTIC   . Robotic pelvic and para-aortic lymph node dissection  12/20/2012    Procedure: ROBOTIC PELVIC AND PARA-AORTIC LYMPH NODE  DISSECTION;  Surgeon: Rejeana Brock A. Duard Brady, MD;  Location: [facility];  Service: Gynecology;  Laterality: Bilateral;    History   Social History  . Marital Status: Married    Spouse Name: N/A    Number of Children: 0  . Years of Education: N/A   Social History Main Topics  . Smoking status: Never Smoker   . Smokeless tobacco: None  . Alcohol Use: Yes     Comment: occassional  . Drug Use: No  . Sexually Active: Not Currently    Birth Control/ Protection: Post-menopausal   Other Topics Concern  . None   Social History Narrative   Patient does not have any biological children but has step children    REPRODUCTIVE HISTORY AND PERSONAL RISK ASSESSMENT FACTORS: Menarche was at age [age].   postmenopausal Uterus Intact: no Ovaries Intact: no G0P0A0, first live birth at age N/A  She has not previously undergone treatment for infertility.   Oral Contraceptive use: 2 years   She has used HRT in the past.    FAMILY HISTORY:  We obtained a detailed, 4-generation family history.  Significant diagnoses are listed below: Family History  Problem Relation Age of Onset  . Stroke Mother 16    arterial wall of artery in eye   . Endometrial cancer Mother 2  . Dementia Father   . Stroke Father     "lots of many strokes"  .  Aneurysm Father 73    AAA  . Multiple births Maternal Grandmother     died 5 days after delivery of unknown causes  . Colon cancer Maternal Aunt     late 65s  . Cancer Cousin     maternal cousin with an unknown form of cancer   Patient's maternal ancestors are of unknown descent, and paternal ancestors are of [place] descent. There is no reported Ashkenazi Jewish ancestry. There is no  known consanguinity.  GENETIC COUNSELING ASSESSMENT: Lawanna Cecere is a 67 y.o. female with a personal history of endometrial cancer and family history of endometrial and colon cancer which somewhat suggestive of a Lynch syndrome and predisposition to cancer. We, therefore, discussed and  recommended the following at today's visit.   DISCUSSION: We reviewed the characteristics, features and inheritance patterns of hereditary cancer syndromes. 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 No methylation studies were performed.  This result is suggestive of either a mutation within MLH1 (most likely), or PMS2, or a sporadic tumor.  We also discussed that the endometrial cancer could be related to a hereditary cancer syndrome and that the colon cancer could be a sporadic cancer based on the late age of onset of the cancer in her aunt.  We also discussed genetic testing, including the appropriate family members to test, the process of testing, insurance coverage and turn-around-time for results. We recommended Allyn Bartelson pursue genetic testing for Endometrial cancer panel at [facility].   PLAN: After considering the risks, benefits, and limitations, Amiya Escamilla provided informed consent to pursue genetic testing and the blood sample will be sent to [facility] for analysis of the Endometrial Cancer panel. We discussed the implications of a positive, negative and/ or variant of uncertain significance genetic test result. Results should be available within approximately 4-5 weeks' time, at which point they will be disclosed by telephone to Marily Lente, as will any additional her recommendations warranted by these results. Yomira Flitton will receive a summary of her genetic counseling visit and a copy of her results once available. This information will also be available in Epic. We encouraged Yeni Jiggetts to remain in contact with cancer genetics annually so that we can continuously update the family history and inform her of any changes in cancer genetics and testing that may be of benefit for her family. Juliana Mormile's questions were answered to her satisfaction today. Our contact information was provided should additional questions or concerns  arise.  Per the patient's request, we will contact her by telephone to discuss these results. A follow up genetic counseling visit will be scheduled if indicated.  The patient was seen for a total of 60 minutes, greater than 50% of which was spent face-to-face counseling.  This plan is being carried out per Dr. Feliz Beam recommendations.  This note will also be sent to the referring provider via the electronic medical record. The patient will be supplied with a summary of this genetic counseling discussion as well as educational information on the discussed hereditary cancer syndromes following the conclusion of their visit.   Patient was discussed with Dr. Drue Second.   _______________________________________________________________________ For Office Staff:  Number of people involved in session: 1 Was an Intern/ student involved with case: no

## 2013-06-08 ENCOUNTER — Telehealth: Payer: Self-pay | Admitting: Genetic Counselor

## 2013-06-08 NOTE — Telephone Encounter (Signed)
Revealed negative genetic testing but she has an MSH6 VUS.  She would like to participate in family studies.  I will contact the lab and let them know.

## 2013-06-19 ENCOUNTER — Telehealth: Payer: Self-pay | Admitting: Genetic Counselor

## 2013-06-19 NOTE — Telephone Encounter (Signed)
Notified patient that her mother was approved for variant testing program for the MSH6 variant found in the patient.  She will talk with her mother and call me back to determine whether she is interested in proceeding or not.

## 2013-06-23 ENCOUNTER — Telehealth: Payer: Self-pay | Admitting: Genetic Counselor

## 2013-06-23 ENCOUNTER — Encounter: Payer: Self-pay | Admitting: Genetic Counselor

## 2013-06-23 NOTE — Telephone Encounter (Signed)
Revealed that I was not able to find a genetics clinic for her mother closer than [place], [place] (her mother is an hour away).  Brooke Williams will call the beginning of Dec. To get a test kit from me and take it down when she visits her mother.

## 2013-10-02 ENCOUNTER — Encounter: Payer: Self-pay | Admitting: Cardiovascular Disease

## 2013-10-05 ENCOUNTER — Other Ambulatory Visit: Payer: Self-pay

## 2013-10-10 ENCOUNTER — Encounter: Payer: Self-pay | Admitting: Cardiovascular Disease

## 2013-10-10 ENCOUNTER — Ambulatory Visit (INDEPENDENT_AMBULATORY_CARE_PROVIDER_SITE_OTHER): Payer: Medicare Other | Admitting: Cardiovascular Disease

## 2013-10-10 VITALS — BP 110/62 | HR 69 | Ht 66.5 in | Wt 178.4 lb

## 2013-10-10 DIAGNOSIS — C541 Malignant neoplasm of endometrium: Secondary | ICD-10-CM

## 2013-10-10 DIAGNOSIS — C549 Malignant neoplasm of corpus uteri, unspecified: Secondary | ICD-10-CM

## 2013-10-10 DIAGNOSIS — E785 Hyperlipidemia, unspecified: Secondary | ICD-10-CM

## 2013-10-10 DIAGNOSIS — Z8249 Family history of ischemic heart disease and other diseases of the circulatory system: Secondary | ICD-10-CM

## 2013-10-10 MED ORDER — PITAVASTATIN CALCIUM 4 MG PO TABS: ORAL_TABLET | ORAL | Status: DC

## 2013-10-10 MED ORDER — PITAVASTATIN CALCIUM 4 MG PO TABS: 4.0000 mg | ORAL_TABLET | Freq: Every day | ORAL | Status: DC

## 2013-10-10 NOTE — Progress Notes (Signed)
Patient ID: Brooke Williams, female   DOB: August 02, 1946, 67 y.o.   MRN: [id number]     HPI: Brooke Williams is a 67 y.o. female who presents for one-year cardiology evaluation.  Brooke Williams has a history of lupus erythematosus, hyperlipidemia, and strong family history for premature coronary disease. 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 In the past I tried initiating therapy with Simcor 500/20 due to her lipid parameters but she was unable to tolerate this. Subsequently, we did try low-dose Crestor but again she stopped it secondary to development of vague myalgias.  She tells me since I last saw her she was diagnosed with endometrial cancer and underwent complete hysterectomy in January 2014. She's not required any chemotherapy or radiation. She denies recent chest pain. She denies shortness of breath. She does have or lupus with has been controlled. She does have some arthritic symptoms in her fingers.  Past Medical History  Diagnosis Date  . Lupus     in remission for 5-6 yrs, not on meds now.(more skin reactions,joint pain, sores in mouth)  . PONV (postoperative nausea and vomiting)   . Seasonal allergies   . GERD (gastroesophageal reflux disease)     controls with Zantac  . Headache(784.0)     hx. migraines  . Arthritis     arthritis-hands. cervical disc degeneration  . Endometrial cancer 2014    Past Surgical History  Procedure Laterality Date  . Medial partial knee replacement      LT knee  . Other surgical history      metal pin/screw to repair arthritis in LT thumb joint  . Hand surgery      left thumb surgery  . Dilation and curettage of uterus      2008-polyp removed  . Robotic assisted total hysterectomy with bilateral  salpingo oopherectomy  12/20/2012    Procedure: ROBOTIC ASSISTED TOTAL HYSTERECTOMY WITH BILATERAL SALPINGO OOPHORECTOMY;  Surgeon: Brooke Brock A. Duard Brady, MD;  Location: [facility];  Service: Gynecology;  Laterality: N/A;  WITH PELVIC PARAAORTIC   . Robotic pelvic and para-aortic lymph node dissection  12/20/2012    Procedure: ROBOTIC PELVIC AND PARA-AORTIC LYMPH NODE DISSECTION;  Surgeon: Brooke Brock A. Duard Brady, MD;  Location: [facility];  Service: Gynecology;  Laterality: Bilateral;    Allergies  Allergen Reactions  . Penicillins Hives    Current Outpatient Prescriptions  Medication Sig Dispense Refill  . CALCIUM PO Take 1,000 mg by mouth daily.       . Cholecalciferol (VITAMIN D-3 PO) Take 1 tablet by mouth daily. 2000 IU      . Coenzyme Q10 (CO Q 10 PO) Take 300 mg by mouth daily.      Marland Kitchen estradiol (VIVELLE-DOT) 0.0375 MG/24HR Place 1 patch onto the skin 2 (two) times a week.      . fish oil-omega-3 fatty acids 1000 MG capsule Take 1 g by mouth daily.      . fluticasone (FLONASE) 50 MCG/ACT nasal spray       . loratadine (CLARITIN) 10 MG tablet Take 10 mg by mouth daily as needed.       . loratadine-pseudoephedrine (CLARITIN-D 12-HOUR) 5-120 MG per tablet Take 1 tablet by mouth daily. As needed      . Multiple Vitamin (  MULTIVITAMIN WITH MINERALS) TABS Take 1 tablet by mouth daily.      . psyllium (METAMUCIL) 58.6 % powder Take 1 packet by mouth daily as needed. For fiber      . ranitidine (ZANTAC) 150 MG tablet Take 150 mg by mouth daily.       No current facility-administered medications for this visit.    History   Social History  . Marital Status: Married    Spouse Name: N/A    Number of Children: 0  . Years of Education: N/A   Occupational History  . Not on file.   Social History Main Topics  . Smoking status: Never Smoker   . Smokeless tobacco: Not on file  . Alcohol Use: Yes     Comment: occassional  . Drug Use: No  . Sexual Activity: Not Currently    Birth Control/ Protection:  Post-menopausal   Other Topics Concern  . Not on file   Social History Narrative   Patient does not have any biological children but has step children    Family History  Problem Relation Age of Onset  . Stroke Mother 46    arterial wall of artery in eye   . Endometrial cancer Mother 66  . Dementia Father   . Stroke Father     "lots of many strokes"  . Aneurysm Father 37    AAA  . Multiple births Maternal Grandmother     died 5 days after delivery of unknown causes  . Colon cancer Maternal Aunt     late 58s  . Cancer Cousin     maternal cousin with an unknown form of cancer   Additional social history is that I take care of her husband Brooke Williams. There is no history of ever smoking tobacco. Occasional alcohol use. She does try to exercise.  ROS is negative for fevers, chills or night sweats. She denies visual changes. She denies PND or orthopnea. She denies cough or sputum production. She denies presyncope or syncope. She is unaware of any lymphadenopathy. There are no skin lesions. She denies chest wall pain or exertional chest tightness. She denies indigestion symptoms. There is no blood in the stool or urine. Her endometrial CA was initially picked up when she was starting to have vaginal bleeding. Is completely resolved. She denies any edema. She denies paresthesias. There is no awareness of diabetes or thyroid abnormalities.  Other comprehensive 12 point system review is negative.  PE BP 110/62  Pulse 69  Ht 5' 6.5" (1.689 m)  Wt 178 lb 6.4 oz (80.922 kg)  BMI 28.37 kg/m2  General: Alert, oriented, no distress.  Skin: normal turgor, no rashes HEENT: Normocephalic, atraumatic. Pupils round and reactive; sclera anicteric;no lid lag.  Nose without nasal septal hypertrophy Mouth/Parynx benign; Mallinpatti scale 2 Neck: No JVD, no carotid briuts Lungs: clear to ausculatation and percussion; no wheezing or rales Heart: RRR, s1 s2 normal over 6 systolic  murmur Abdomen: soft, nontender; no hepatosplenomehaly, BS+; abdominal aorta nontender and not dilated by palpation. Pulses 2+ Extremities: no clubbing cyanosis or edema, Homan's sign negative  Neurologic: grossly nonfocal Psychologic: normal affect and mood.  ECG: Sinus rhythm at 69 beats per minute. No ectopy. Normal intervals.  LABS:  BMET    Component Value Date/Time   NA 141 12/21/2012 0453   K 3.7 12/21/2012 0453   CL 108 12/21/2012 0453   CO2 24 12/21/2012 0453   GLUCOSE 129* 12/21/2012 0453   BUN 9 12/21/2012 0453  CREATININE 0.62 12/21/2012 0453   CALCIUM 8.8 12/21/2012 0453   GFRNONAA >90 12/21/2012 0453   GFRAA >90 12/21/2012 0453     Hepatic Function Panel     Component Value Date/Time   PROT 7.5 12/16/2012 1150   ALBUMIN 4.3 12/16/2012 1150   AST 31 12/16/2012 1150   ALT 42* 12/16/2012 1150   ALKPHOS 84 12/16/2012 1150   BILITOT 0.4 12/16/2012 1150     CBC    Component Value Date/Time   WBC 11.7* 12/21/2012 0453   RBC 4.09 12/21/2012 0453   HGB 12.5 12/21/2012 0453   HCT 38.1 12/21/2012 0453   PLT 237 12/21/2012 0453   MCV 93.2 12/21/2012 0453   MCH 30.6 12/21/2012 0453   MCHC 32.8 12/21/2012 0453   RDW 13.3 12/21/2012 0453   LYMPHSABS 1.2 12/16/2012 1150   MONOABS 0.7 12/16/2012 1150   EOSABS 0.1 12/16/2012 1150   BASOSABS 0.1 12/16/2012 1150     BNP No results found for this basename: probnp    Lipid Panel  No results found for this basename: chol, trig, hdl, cholhdl, vldl, ldlcalc     RADIOLOGY: No results found.  Recent NMR lipoprotein was performed and this shows significant increase LDL particle #2251, LDL calculated 152, HDL 54, triglycerides 180, total cholesterol 242, small LDL particle #1427, and insulin resistance coronary increased at 49.   ASSESSMENT AND PLAN: I had a long discussion with Brooke Williams. She now continues to have significant hyperlipidemia with marked elevation of her LDL particle number. She does have a strong family history  for premature coronary artery disease and does have significant genetic markers placing her at increased risk for CAD. In the past, she did not tolerate Simcor apparently allegedly did not tolerate Crestor. I provided her with samples of live alone and have suggested she try taking this 2 mg daily with coenzyme Q10 300 mg. We did discuss increased exercise weight reduction. In 3 months we will repeat an NMR profile to see if significant improvement has been made. If additional treatment is necessary we may be able to add zetia. I will see her in 6 months for cardiology evaluation.     Lennette Bihari, MD, [facility]  10/10/2013 11:50 AM

## 2013-10-10 NOTE — Patient Instructions (Signed)
Your physician has recommended you make the following change in your medication: start livalo as directed.  Your physician recommends that you return for lab work in: 3 MONTHS.  Your physician recommends that you schedule a follow-up appointment in: 6 MONTHS.

## 2013-12-01 ENCOUNTER — Other Ambulatory Visit (HOSPITAL_COMMUNITY): Payer: Self-pay | Admitting: Obstetrics and Gynecology

## 2013-12-01 DIAGNOSIS — Z1231 Encounter for screening mammogram for malignant neoplasm of breast: Secondary | ICD-10-CM

## 2013-12-25 ENCOUNTER — Ambulatory Visit (HOSPITAL_COMMUNITY)
Admission: RE | Admit: 2013-12-25 | Discharge: 2013-12-25 | Disposition: A | Discharge status: Home / Self Care | Payer: Medicare Other | Source: Ambulatory Visit | Attending: Obstetrics and Gynecology | Admitting: Obstetrics and Gynecology

## 2013-12-25 DIAGNOSIS — Z1231 Encounter for screening mammogram for malignant neoplasm of breast: Secondary | ICD-10-CM | POA: Insufficient documentation

## 2014-01-05 ENCOUNTER — Other Ambulatory Visit: Payer: Self-pay | Admitting: Obstetrics and Gynecology

## 2014-01-05 DIAGNOSIS — R1032 Left lower quadrant pain: Secondary | ICD-10-CM

## 2014-01-10 ENCOUNTER — Ambulatory Visit
Admission: RE | Admit: 2014-01-10 | Discharge: 2014-01-10 | Disposition: A | Discharge status: Home / Self Care | Payer: Medicare Other | Source: Ambulatory Visit | Attending: Obstetrics and Gynecology | Admitting: Obstetrics and Gynecology

## 2014-01-10 DIAGNOSIS — R1032 Left lower quadrant pain: Secondary | ICD-10-CM

## 2014-01-10 MED ORDER — IOHEXOL 300 MG/ML  SOLN
100.0000 mL | Freq: Once | INTRAMUSCULAR | Status: AC | PRN
  Administered 2014-01-10: 100 mL via INTRAVENOUS

## 2014-01-16 ENCOUNTER — Telehealth: Payer: Self-pay | Admitting: *Deleted

## 2014-01-16 NOTE — Telephone Encounter (Signed)
Call to pt to confirmed appt on 2/18.  

## 2014-01-17 ENCOUNTER — Encounter: Payer: Self-pay | Admitting: Gynecologic Oncology

## 2014-01-17 ENCOUNTER — Ambulatory Visit: Payer: Medicare Other | Attending: Gynecologic Oncology | Admitting: Gynecologic Oncology

## 2014-01-17 VITALS — BP 139/80 | HR 105 | Temp 98.5°F | Resp 22 | Wt 171.1 lb

## 2014-01-17 DIAGNOSIS — K219 Gastro-esophageal reflux disease without esophagitis: Secondary | ICD-10-CM | POA: Insufficient documentation

## 2014-01-17 DIAGNOSIS — Z79890 Hormone replacement therapy (postmenopausal): Secondary | ICD-10-CM | POA: Insufficient documentation

## 2014-01-17 DIAGNOSIS — Z9079 Acquired absence of other genital organ(s): Secondary | ICD-10-CM | POA: Insufficient documentation

## 2014-01-17 DIAGNOSIS — C549 Malignant neoplasm of corpus uteri, unspecified: Secondary | ICD-10-CM | POA: Insufficient documentation

## 2014-01-17 DIAGNOSIS — C541 Malignant neoplasm of endometrium: Secondary | ICD-10-CM

## 2014-01-17 DIAGNOSIS — Z90710 Acquired absence of both cervix and uterus: Secondary | ICD-10-CM | POA: Insufficient documentation

## 2014-01-17 DIAGNOSIS — E039 Hypothyroidism, unspecified: Secondary | ICD-10-CM | POA: Insufficient documentation

## 2014-01-17 DIAGNOSIS — Z79899 Other long term (current) drug therapy: Secondary | ICD-10-CM | POA: Insufficient documentation

## 2014-01-17 DIAGNOSIS — M329 Systemic lupus erythematosus, unspecified: Secondary | ICD-10-CM | POA: Insufficient documentation

## 2014-01-17 DIAGNOSIS — Z8049 Family history of malignant neoplasm of other genital organs: Secondary | ICD-10-CM | POA: Insufficient documentation

## 2014-01-17 NOTE — Patient Instructions (Signed)
Surgery at [facility] on 01/19/14 with preoperative appointment at [facility] on 01/18/14 at 2:00 on the first floor of the [facility]  Laparotomy Care After Refer to this sheet in the next few weeks. These instructions provide you with information on caring for yourself after your procedure. Your caregiver may also give you more specific instructions. Your treatment has been planned according to current medical practices, but problems sometimes occur. Call your caregiver if you have any problems or questions after your procedure. HOME CARE INSTRUCTIONS ACTIVITY  Rest as much as possible the first two weeks at home.  Avoid strenuous activity such as heavy lifting (more than 10 pounds), pushing, or pulling. Limit stair climbing to once or twice a day for the first week, then slowly increase this activity.  Take frequent rest periods throughout the day.  Talk with your caregiver about when you may resume your usual physical activity.  You need to be out of bed and walking as much as possible. This decreases the chance of:  Blood clots.  Pneumonia. NUTRITION  You can resume your normal diet once you regain bowel function.  Drink plenty of fluids (6 8 glasses a day or as instructed by your caregiver).  Eat a well-balanced diet.  Daily portions of food from the meat (protein), milk, vegetable, and bread groups are necessary for your health. ELIMINATION It is very important not to strain during bowel movements. If constipation should occur, you may:  Take a mild laxative.  Add fruit and bran to your diet.  Drink more fluids. HYGIENE  Take showers, not baths, until 4 6 weeks after surgery.  If your incision is closed, you may take a shower or tub bath. FEVER If you feel feverish or have shaking chills, take your temperature. If it is 102 F (38.9 C), call your caregiver. The fever may mean there is an infection. PAIN CONTROL  Mild discomfort may occur.  Only take over-the-counter or prescription  medicines for pain, discomfort, or fever as directed by your caregiver. Take any prescribed medicines exactly as directed. INCISION CARE  Keep your incision site clean with soap and water.  Do not use a dressing unless your cut (incision) from surgery is draining or irritated.  If you have small adhesive strips in place and they do not fall off within 10 days, carefully peel them off.  Check your incision and surrounding area daily for any redness, swelling, discoloration, heavy drainage, or separation of the skin. SEXUAL INTERCOURSE Do not have sexual intercourse until after your follow-up appointment, unless your caregiver tells you otherwise. SEEK MEDICAL CARE IF:   You are unable to tolerate food or drinks.  You are unable to pass gas or have a bowel movement.  Your pain becomes more severe or is not relieved with medicines.  You have redness, swelling, discoloration, heavy drainage, or separation of the skin at the incision site. Document Released: 06/30/2004 Document Revised: 11/02/2012 Document Reviewed: 11/15/2007 ExitCare Patient Information 2014 [place]. [facility].

## 2014-01-17 NOTE — Progress Notes (Signed)
Consult Note: Gyn-Onc  Brooke Williams 68 y.o. female  CC:  Chief Complaint  Patient presents with  . Follow-up    HPI: Patient is a 68 year old gravida 0 who went through menopause at the age of 36. Since that time she's been maintained on hormone replacement therapy. During Thanksgiving she began hemorrhaging with clots. 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 She up subsequently underwent surgery. She underwent a total robotic hysterectomy BSO bilateral para-aortic and pelvic lymph node dissection on January 21. Operative findings included normal intra-abdominal anatomy. The uterus, cervix, bilateral adnexa and lymph nodes were normal. She had some physiologic adhesions of the colon to the left pelvic sidewall.  Pathology revealed:  1. Lymph node, biopsy, right para aortic  - SIX LYMPH NODES, NEGATIVE FOR METASTATIC CARCINOMA (0/6).  2. Lymph node, biopsy, left para aortic  - FIVE LYMPH NODES, NEGATIVE FOR METASTATIC CARCINOMA (0/5).  3. Uterus, ovaries and fallopian tubes, with cervix  - SUPERFICIALLY INVASIVE ENDOMETRIOID CARCINOMA, FIGO GRADE III, CONFINED WITHIN  INNER HALF OF THE MYOMETRIUM; NO EVIDENCE OF ANGIOLYMPHATIC INVASION IDENTIFIED.  - LEIOMYOMA.  - CERVIX: BENIGN SQUAMOUS MUCOSA AND ENDOCERVICAL MUCOSA, NO DYSPLASIA OR  MALIGNANCY.  - BILATERAL OVARIES AND FALLOPIAN TUBES: NO PATHOLOGIC ABNORMALITIES.  4. Lymph node, biopsy, right pelvic  - FOUR LYMPH NODES, NEGATIVE FOR METASTATIC CARCINOMA (0/4).  5. Lymph node, biopsy, left pelvic  - SEVEN LYMPH NODES, NEGATIVE FOR METASTATIC CARCINOMA (0/7).   She also has loss of expression of PMS2 and [place] 1. She was given these results by phone and I scheduled her to see genetics. Genetics testing was essentially negative.  Interval History:  She states that when we saw her in June she would have a very low energy. She was seen by her primary care physician October which time the TSH was drawn. She was felt to be  hypothyroid and was started on Synthroid. The TSH is subsequently normalized but does not improved her energy level. She was seen in Dr. Kennith Maes office in December. Exam, a Pap smear, and mammogram were negative. A few days after examination she began developing experiencing some left lower quadrant pain. He checked her white count to rule out colitis and ordered a CT scan. The CT scan revealed:  FINDINGS:  11 mm subpleural noncalcified nodule in the superior segment right lower lobe (series 3/image 6), indeterminate. While metastasis is difficult to exclude on the current CT, this appears unchanged from prior chest radiograph dated 12/16/2012, which suggests a benign etiology. Liver is notable for focal fat/altered perfusion along the falciform ligament (series 2/ image 33). Spleen, pancreas, and adrenal glands are within normal limits. Gallbladder is unremarkable. No intrahepatic or extrahepatic ductal dilatation.  1.3 cm lateral interpolar left renal cyst (series 2/ image 37). Right kidney is within normal limits. No hydronephrosis. No evidence of bowel obstruction. Normal appendix. Left colon is decompressed. No evidence of abdominal aortic aneurysm. No abdominopelvic ascites.  8.3 x 6.7 x 7.5 cm soft tissue mass beneath the left lower abdominal wall (series 2/image 68), suspicious for nodal or peritoneal metastasis. Additional 7 x 9 mm peritoneal implant beneath the left mid anterior abdominal wall (series 2/ image 60) and 12 x 18 mm peritoneal implant beneath the midline lower anterior abdominal wall (series 2/ image 70).  Status post hysterectomy and bilateral salpingo-oophorectomy.  Bladder is within normal limits.  Tiny fat containing left inguinal hernia.  Degenerative changes of the visualized thoracolumbar spine.  IMPRESSION:  Status post hysterectomy with bilateral  salpingo-oophorectomy. 8.3 cm soft tissue mass beneath the left lower abdominal wall, suspicious for nodal or peritoneal  metastasis. Additional small peritoneal nodules/metastases, as described above. 11 mm subpleural nodule in the superior segment right lower lobe, indeterminate. While metastasis is difficult to exclude, this appears unchanged from prior chest radiograph dated 12/16/2012, favoring a benign etiology. If clinically warranted, PET-CT can be performed for further evaluation.  She states that she is having fairly significant pain now is making it difficult to move around. She declines any pain medication. She is very anxious which is understandable. We reviewed her CT scan today.  Review of Systems  Constitutional: Feels tired with no energy during the day.   Denies fever. Skin: No rash, sores, jaundice, itching, or dryness.  Cardiovascular: No chest pain, shortness of breath, or edema  Pulmonary: No cough or wheeze.  Gastro Intestinal: Reporting intermittent lower abdominal soreness.  No nausea, vomiting, constipation, or diarrhea reported. No bright red blood per rectum or change in bowel movement.  Genitourinary: No frequency, urgency, or dysuria.  Denies vaginal bleeding and discharge.  Musculoskeletal: No myalgia, arthralgia, joint swelling or pain.  Neurologic: No weakness, numbness, or change in gait.  Psychology: Very anxious  Current Meds:  Outpatient Encounter Prescriptions as of 01/17/2014  Medication Sig  . CALCIUM PO Take 1,000 mg by mouth daily.   . Cholecalciferol (VITAMIN D-3 PO) Take 1 tablet by mouth daily. 2000 IU  . Coenzyme Q10 (CO Q 10 PO) Take 300 mg by mouth daily.  Marland Kitchen estradiol (VIVELLE-DOT) 0.0375 MG/24HR Place 1 patch onto the skin 2 (two) times a week.  . fish oil-omega-3 fatty acids 1000 MG capsule Take 1 g by mouth daily.  . fluticasone (FLONASE) 50 MCG/ACT nasal spray   . levothyroxine (SYNTHROID, LEVOTHROID) 25 MCG tablet Take 25 mcg by mouth daily before breakfast.  . loratadine (CLARITIN) 10 MG tablet Take 10 mg by mouth daily as needed.   .  loratadine-pseudoephedrine (CLARITIN-D 12-HOUR) 5-120 MG per tablet Take 1 tablet by mouth daily. As needed  . meloxicam (MOBIC) 7.5 MG tablet Take 7.5 mg by mouth daily.  . Multiple Vitamin (MULTIVITAMIN WITH MINERALS) TABS Take 1 tablet by mouth daily.  . Pitavastatin Calcium (LIVALO) 4 MG TABS Take 1/2 tablet daily  . Pitavastatin Calcium 4 MG TABS Take 1 tablet (4 mg total) by mouth daily.  . psyllium (METAMUCIL) 58.6 % powder Take 1 packet by mouth daily as needed. For fiber  . ranitidine (ZANTAC) 150 MG tablet Take 150 mg by mouth daily.    Allergy:  Allergies  Allergen Reactions  . Penicillins Hives    Social Hx:   History   Social History  . Marital Status: Married    Spouse Name: N/A    Number of Children: 0  . Years of Education: N/A   Occupational History  . Not on file.   Social History Main Topics  . Smoking status: Never Smoker   . Smokeless tobacco: Not on file  . Alcohol Use: Yes     Comment: occassional  . Drug Use: No  . Sexual Activity: Not Currently    Birth Control/ Protection: Post-menopausal   Other Topics Concern  . Not on file   Social History Narrative   Patient does not have any biological children but has step children    Past Surgical Hx:  Past Surgical History  Procedure Laterality Date  . Medial partial knee replacement      LT knee  . Other  surgical history      metal pin/screw to repair arthritis in LT thumb joint  . Hand surgery      left thumb surgery  . Dilation and curettage of uterus      2008-polyp removed  . Robotic assisted total hysterectomy with bilateral salpingo oopherectomy  12/20/2012    Procedure: ROBOTIC ASSISTED TOTAL HYSTERECTOMY WITH BILATERAL SALPINGO OOPHORECTOMY;  Surgeon: Imagene Gurney A. Alycia Rossetti, MD;  Location: [facility];  Service: Gynecology;  Laterality: N/A;  WITH PELVIC PARAAORTIC   . Robotic pelvic and para-aortic lymph node dissection  12/20/2012    Procedure: ROBOTIC PELVIC AND PARA-AORTIC LYMPH NODE  DISSECTION;  Surgeon: Imagene Gurney A. Alycia Rossetti, MD;  Location: [facility];  Service: Gynecology;  Laterality: Bilateral;    Past Medical Hx:  Past Medical History  Diagnosis Date  . Lupus     in remission for 5-6 yrs, not on meds now.(more skin reactions,joint pain, sores in mouth)  . PONV (postoperative nausea and vomiting)   . Seasonal allergies   . GERD (gastroesophageal reflux disease)     controls with Zantac  . Headache(784.0)     hx. migraines  . Arthritis     arthritis-hands. cervical disc degeneration  . Endometrial cancer 2014    Family Hx:  Family History  Problem Relation Age of Onset  . Stroke Mother 36    arterial wall of artery in eye   . Endometrial cancer Mother 42  . Dementia Father   . Stroke Father     "lots of many strokes"  . Aneurysm Father 76    AAA  . Multiple births Maternal Grandmother     died 5 days after delivery of unknown causes  . Colon cancer Maternal Aunt     late 77s  . Cancer Cousin     maternal cousin with an unknown form of cancer    Vitals:  Blood pressure 139/80, pulse 105, temperature 98.5 F (36.9 C), temperature source Oral, resp. rate 22, weight 171 lb 1.6 oz (77.61 kg).  Physical Exam: Well-nourished well-developed female in no acute distress.   Abdomen: Well-healed surgical incisions. Abdomen is soft and nontender. Questionable fullness in the left lower quadrant. Very indistinct. Abdomen is nontender.  Pelvic was not performed she has had 2 pelvic exams within the last several weeks that were negative.  Groins: No lymphadenopathy.  Extremities: No edema   Assessment/Plan:  68 year old with stage IA grade 3 endometrioid adenocarcinoma. 0/22 lymph nodes were involved. She had no lymphovascular space involvement. Her tumor involved only 0.3 cm with the myometrium was 1.5 cm in thickness. I reviewed with her different risk factors including the gynecologic oncology group criteria for adjuvant radiation therapy. She does not meet  criteria for adjuvant radiation therapy. She can be dispositioned to close followup.  We discussed her CT scan today. We discussed options including fine-needle aspiration for diagnosis and then considering surgery versus immediate surgery. The patient will like to proceed with immediate surgical debulking of this lesion. She wants to have this done as soon as possible and was offered a surgical date of February 20 at [facility] with a preoperative visit tomorrow. She would like to proceed with that. Surgical plan including expiratory laparotomy with debulking surgery was discussed. She understands that if this represents a recurrence of her endometrial cancer we will need to follow up with chemotherapy and potentially radiation. Her questions regarding surgery as well as those of her husband were elicited and answered to as their satisfaction. She is  a preoperative visit scheduled for tomorrow at 2:00.   Rollyn Scialdone A., MD 01/17/2014, 11:18 AM

## 2014-02-01 ENCOUNTER — Ambulatory Visit: Payer: Medicare Other | Attending: Gynecologic Oncology | Admitting: Gynecologic Oncology

## 2014-02-01 ENCOUNTER — Encounter: Payer: Self-pay | Admitting: Gynecologic Oncology

## 2014-02-01 VITALS — BP 100/84 | HR 80 | Temp 98.7°F | Resp 18 | Ht 66.3 in | Wt 165.1 lb

## 2014-02-01 DIAGNOSIS — M329 Systemic lupus erythematosus, unspecified: Secondary | ICD-10-CM | POA: Insufficient documentation

## 2014-02-01 DIAGNOSIS — C549 Malignant neoplasm of corpus uteri, unspecified: Secondary | ICD-10-CM | POA: Insufficient documentation

## 2014-02-01 DIAGNOSIS — Z79899 Other long term (current) drug therapy: Secondary | ICD-10-CM | POA: Insufficient documentation

## 2014-02-01 DIAGNOSIS — Z800 Family history of malignant neoplasm of digestive organs: Secondary | ICD-10-CM | POA: Insufficient documentation

## 2014-02-01 DIAGNOSIS — Z96659 Presence of unspecified artificial knee joint: Secondary | ICD-10-CM | POA: Insufficient documentation

## 2014-02-01 DIAGNOSIS — Z823 Family history of stroke: Secondary | ICD-10-CM | POA: Insufficient documentation

## 2014-02-01 DIAGNOSIS — C541 Malignant neoplasm of endometrium: Secondary | ICD-10-CM

## 2014-02-01 DIAGNOSIS — Z880 Allergy status to penicillin: Secondary | ICD-10-CM | POA: Insufficient documentation

## 2014-02-01 DIAGNOSIS — K219 Gastro-esophageal reflux disease without esophagitis: Secondary | ICD-10-CM | POA: Insufficient documentation

## 2014-02-01 DIAGNOSIS — Z90710 Acquired absence of both cervix and uterus: Secondary | ICD-10-CM | POA: Insufficient documentation

## 2014-02-01 NOTE — Patient Instructions (Signed)
Brooke Williams will give you a call to discuss the study.  Carboplatin injection What is this medicine? CARBOPLATIN (KAR boe pla tin) is a chemotherapy drug. It targets fast dividing cells, like cancer cells, and causes these cells to die. This medicine is used to treat ovarian cancer and many other cancers. This medicine may be used for other purposes; ask your health care provider or pharmacist if you have questions. COMMON BRAND NAME(S): Paraplatin What should I tell my health care provider before I take this medicine? They need to know if you have any of these conditions: -blood disorders -hearing problems -kidney disease -recent or ongoing radiation therapy -an unusual or allergic reaction to carboplatin, cisplatin, other chemotherapy, other medicines, foods, dyes, or preservatives -pregnant or trying to get pregnant -breast-feeding How should I use this medicine? This drug is usually given as an infusion into a vein. It is administered in a hospital or clinic by a specially trained health care professional. Talk to your pediatrician regarding the use of this medicine in children. Special care may be needed. Overdosage: If you think you have taken too much of this medicine contact a poison control center or emergency room at once. NOTE: This medicine is only for you. Do not share this medicine with others. What if I miss a dose? It is important not to miss a dose. Call your doctor or health care professional if you are unable to keep an appointment. What may interact with this medicine? -medicines for seizures -medicines to increase blood counts like filgrastim, pegfilgrastim, sargramostim -some antibiotics like amikacin, gentamicin, neomycin, streptomycin, tobramycin -vaccines Talk to your doctor or health care professional before taking any of these medicines: -acetaminophen -aspirin -ibuprofen -ketoprofen -naproxen This list may not describe all possible interactions. Give  your health care provider a list of all the medicines, herbs, non-prescription drugs, or dietary supplements you use. Also tell them if you smoke, drink alcohol, or use illegal drugs. Some items may interact with your medicine. What should I watch for while using this medicine? Your condition will be monitored carefully while you are receiving this medicine. You will need important blood work done while you are taking this medicine. This drug may make you feel generally unwell. This is not uncommon, as chemotherapy can affect healthy cells as well as cancer cells. Report any side effects. Continue your course of treatment even though you feel ill unless your doctor tells you to stop. In some cases, you may be given additional medicines to help with side effects. Follow all directions for their use. Call your doctor or health care professional for advice if you get a fever, chills or sore throat, or other symptoms of a cold or flu. Do not treat yourself. This drug decreases your body's ability to fight infections. Try to avoid being around people who are sick. This medicine may increase your risk to bruise or bleed. Call your doctor or health care professional if you notice any unusual bleeding. Be careful brushing and flossing your teeth or using a toothpick because you may get an infection or bleed more easily. If you have any dental work done, tell your dentist you are receiving this medicine. Avoid taking products that contain aspirin, acetaminophen, ibuprofen, naproxen, or ketoprofen unless instructed by your doctor. These medicines may hide a fever. Do not become pregnant while taking this medicine. Women should inform their doctor if they wish to become pregnant or think they might be pregnant. There is a potential for serious side  effects to an unborn child. Talk to your health care professional or pharmacist for more information. Do not breast-feed an infant while taking this medicine. What side  effects may I notice from receiving this medicine? Side effects that you should report to your doctor or health care professional as soon as possible: -allergic reactions like skin rash, itching or hives, swelling of the face, lips, or tongue -signs of infection - fever or chills, cough, sore throat, pain or difficulty passing urine -signs of decreased platelets or bleeding - bruising, pinpoint red spots on the skin, black, tarry stools, nosebleeds -signs of decreased red blood cells - unusually weak or tired, fainting spells, lightheadedness -breathing problems -changes in hearing -changes in vision -chest pain -high blood pressure -low blood counts - This drug may decrease the number of white blood cells, red blood cells and platelets. You may be at increased risk for infections and bleeding. -nausea and vomiting -pain, swelling, redness or irritation at the injection site -pain, tingling, numbness in the hands or feet -problems with balance, talking, walking -trouble passing urine or change in the amount of urine Side effects that usually do not require medical attention (report to your doctor or health care professional if they continue or are bothersome): -hair loss -loss of appetite -metallic taste in the mouth or changes in taste This list may not describe all possible side effects. Call your doctor for medical advice about side effects. You may report side effects to FDA at 1-800-FDA-1088. Where should I keep my medicine? This drug is given in a hospital or clinic and will not be stored at home. NOTE: This sheet is a summary. It may not cover all possible information. If you have questions about this medicine, talk to your doctor, pharmacist, or health care provider.  2014, Elsevier/Gold Standard. (2008-02-21 14:38:05) Paclitaxel injection What is this medicine? PACLITAXEL (PAK li TAX el) is a chemotherapy drug. It targets fast dividing cells, like cancer cells, and causes these  cells to die. This medicine is used to treat ovarian cancer, breast cancer, and other cancers. This medicine may be used for other purposes; ask your health care provider or pharmacist if you have questions. COMMON BRAND NAME(S): Onxol , Taxol What should I tell my health care provider before I take this medicine? They need to know if you have any of these conditions: -blood disorders -irregular heartbeat -infection (especially a virus infection such as chickenpox, cold sores, or herpes) -liver disease -previous or ongoing radiation therapy -an unusual or allergic reaction to paclitaxel, alcohol, polyoxyethylated castor oil, other chemotherapy agents, other medicines, foods, dyes, or preservatives -pregnant or trying to get pregnant -breast-feeding How should I use this medicine? This drug is given as an infusion into a vein. It is administered in a hospital or clinic by a specially trained health care professional. Talk to your pediatrician regarding the use of this medicine in children. Special care may be needed. Overdosage: If you think you have taken too much of this medicine contact a poison control center or emergency room at once. NOTE: This medicine is only for you. Do not share this medicine with others. What if I miss a dose? It is important not to miss your dose. Call your doctor or health care professional if you are unable to keep an appointment. What may interact with this medicine? Do not take this medicine with any of the following medications: -disulfiram -metronidazole This medicine may also interact with the following medications: -cyclosporine -diazepam -ketoconazole -medicines  to increase blood counts like filgrastim, pegfilgrastim, sargramostim -other chemotherapy drugs like cisplatin, doxorubicin, epirubicin, etoposide, teniposide, vincristine -quinidine -testosterone -vaccines -verapamil Talk to your doctor or health care professional before taking any of  these medicines: -acetaminophen -aspirin -ibuprofen -ketoprofen -naproxen This list may not describe all possible interactions. Give your health care provider a list of all the medicines, herbs, non-prescription drugs, or dietary supplements you use. Also tell them if you smoke, drink alcohol, or use illegal drugs. Some items may interact with your medicine. What should I watch for while using this medicine? Your condition will be monitored carefully while you are receiving this medicine. You will need important blood work done while you are taking this medicine. This drug may make you feel generally unwell. This is not uncommon, as chemotherapy can affect healthy cells as well as cancer cells. Report any side effects. Continue your course of treatment even though you feel ill unless your doctor tells you to stop. In some cases, you may be given additional medicines to help with side effects. Follow all directions for their use. Call your doctor or health care professional for advice if you get a fever, chills or sore throat, or other symptoms of a cold or flu. Do not treat yourself. This drug decreases your body's ability to fight infections. Try to avoid being around people who are sick. This medicine may increase your risk to bruise or bleed. Call your doctor or health care professional if you notice any unusual bleeding. Be careful brushing and flossing your teeth or using a toothpick because you may get an infection or bleed more easily. If you have any dental work done, tell your dentist you are receiving this medicine. Avoid taking products that contain aspirin, acetaminophen, ibuprofen, naproxen, or ketoprofen unless instructed by your doctor. These medicines may hide a fever. Do not become pregnant while taking this medicine. Women should inform their doctor if they wish to become pregnant or think they might be pregnant. There is a potential for serious side effects to an unborn child. Talk  to your health care professional or pharmacist for more information. Do not breast-feed an infant while taking this medicine. Men are advised not to father a child while receiving this medicine. What side effects may I notice from receiving this medicine? Side effects that you should report to your doctor or health care professional as soon as possible: -allergic reactions like skin rash, itching or hives, swelling of the face, lips, or tongue -low blood counts - This drug may decrease the number of white blood cells, red blood cells and platelets. You may be at increased risk for infections and bleeding. -signs of infection - fever or chills, cough, sore throat, pain or difficulty passing urine -signs of decreased platelets or bleeding - bruising, pinpoint red spots on the skin, black, tarry stools, nosebleeds -signs of decreased red blood cells - unusually weak or tired, fainting spells, lightheadedness -breathing problems -chest pain -high or low blood pressure -mouth sores -nausea and vomiting -pain, swelling, redness or irritation at the injection site -pain, tingling, numbness in the hands or feet -slow or irregular heartbeat -swelling of the ankle, feet, hands Side effects that usually do not require medical attention (report to your doctor or health care professional if they continue or are bothersome): -bone pain -complete hair loss including hair on your head, underarms, pubic hair, eyebrows, and eyelashes -changes in the color of fingernails -diarrhea -loosening of the fingernails -loss of appetite -muscle or joint  pain -red flush to skin -sweating This list may not describe all possible side effects. Call your doctor for medical advice about side effects. You may report side effects to FDA at 1-800-FDA-1088. Where should I keep my medicine? This drug is given in a hospital or clinic and will not be stored at home. NOTE: This sheet is a summary. It may not cover all possible  information. If you have questions about this medicine, talk to your doctor, pharmacist, or health care provider.  2014, Elsevier/Gold Standard. (2013-01-09 16:41:21)

## 2014-02-01 NOTE — Progress Notes (Signed)
Consult Note: Gyn-Onc  Brooke Williams 68 y.o. female  CC:  Chief Complaint  Patient presents with  . Endometrial cancer    follow up    HPI: Patient is a 68 year old gravida 0 who went through menopause at the age of 17. Since that time she's been maintained on hormone replacement therapy. During Thanksgiving she began hemorrhaging with clots. 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 She up subsequently underwent surgery. She underwent a total robotic hysterectomy BSO bilateral para-aortic and pelvic lymph node dissection on January 21. Operative findings included normal intra-abdominal anatomy. The uterus, cervix, bilateral adnexa and lymph nodes were normal. She had some physiologic adhesions of the colon to the left pelvic sidewall.  Pathology revealed:  1. Lymph node, biopsy, right para aortic  - SIX LYMPH NODES, NEGATIVE FOR METASTATIC CARCINOMA (0/6).  2. Lymph node, biopsy, left para aortic  - FIVE LYMPH NODES, NEGATIVE FOR METASTATIC CARCINOMA (0/5).  3. Uterus, ovaries and fallopian tubes, with cervix  - SUPERFICIALLY INVASIVE ENDOMETRIOID CARCINOMA, FIGO GRADE III, CONFINED WITHIN  INNER HALF OF THE MYOMETRIUM; NO EVIDENCE OF ANGIOLYMPHATIC INVASION IDENTIFIED.  - LEIOMYOMA.  - CERVIX: BENIGN SQUAMOUS MUCOSA AND ENDOCERVICAL MUCOSA, NO DYSPLASIA OR  MALIGNANCY.  - BILATERAL OVARIES AND FALLOPIAN TUBES: NO PATHOLOGIC ABNORMALITIES.  4. Lymph node, biopsy, right pelvic  - FOUR LYMPH NODES, NEGATIVE FOR METASTATIC CARCINOMA (0/4).  5. Lymph node, biopsy, left pelvic  - SEVEN LYMPH NODES, NEGATIVE FOR METASTATIC CARCINOMA (0/7).   She also has loss of expression of PMS2 and [place] 1. She was given these results by phone and I scheduled her to see genetics. Genetics testing was essentially negative.  She states that when we saw her in June she would have a very low energy. She was seen by her primary care physician October which time the TSH was drawn. She was felt to  be hypothyroid and was started on Synthroid. The TSH is subsequently normalized but does not improved her energy level. She was seen in Dr. Kennith Maes office in December. Exam, a Pap smear, and mammogram were negative. A few days after examination she began developing experiencing some left lower quadrant pain. He checked her white count to rule out colitis and ordered a CT scan. The CT scan revealed:  FINDINGS:  11 mm subpleural noncalcified nodule in the superior segment right lower lobe (series 3/image 6), indeterminate. While metastasis is difficult to exclude on the current CT, this appears unchanged from prior chest radiograph dated 12/16/2012, which suggests a benign etiology. Liver is notable for focal fat/altered perfusion along the falciform ligament (series 2/ image 33). Spleen, pancreas, and adrenal glands are within normal limits. Gallbladder is unremarkable. No intrahepatic or extrahepatic ductal dilatation.  1.3 cm lateral interpolar left renal cyst (series 2/ image 37). Right kidney is within normal limits. No hydronephrosis. No evidence of bowel obstruction. Normal appendix. Left colon is decompressed. No evidence of abdominal aortic aneurysm. No abdominopelvic ascites.  8.3 x 6.7 x 7.5 cm soft tissue mass beneath the left lower abdominal wall (series 2/image 68), suspicious for nodal or peritoneal metastasis. Additional 7 x 9 mm peritoneal implant beneath the left mid anterior abdominal wall (series 2/ image 60) and 12 x 18 mm peritoneal implant beneath the midline lower anterior abdominal wall (series 2/ image 70).  Status post hysterectomy and bilateral salpingo-oophorectomy.  Bladder is within normal limits.  Tiny fat containing left inguinal hernia.  Degenerative changes of the visualized thoracolumbar spine.  IMPRESSION:  Status post  hysterectomy with bilateral salpingo-oophorectomy. 8.3 cm soft tissue mass beneath the left lower abdominal wall, suspicious for nodal or peritoneal  metastasis. Additional small peritoneal nodules/metastases, as described above. 11 mm subpleural nodule in the superior segment right lower lobe, indeterminate. While metastasis is difficult to exclude, this appears unchanged from prior chest radiograph dated 12/16/2012, favoring a benign etiology. If clinically warranted, PET-CT can be performed for further evaluation.  Interval History:  She underwent exploratory laparotomy omentectomy and R0 resection on 01/19/2014. Findings included the omentum to the involved with an 8 x 8 cm mass. There were several smaller lesions consistent with 1-2 cm disease within the omentum. The mass is here to sigmoid colon epiploica but not to the colonic wall itself. Pelvic side wall nodule overlying the right external iliac vein measured 8 mm. There is enhancing nodule in the sacrum overlying the sigmoid colon mesentery. Pathology was consistent with metastatic poorly differentiated adenocarcinoma consistent with primary endometrial origin. This involved with the omentum, the small bowel sigmoid epiploica resection and the small bowel nodule. The right sidewall mass was negative. She comes in today for discussion of results and treatment planning.  She was doing quite well. She's feeling better everyday. She's giving herself her Lovenox injections without difficulty.  Current Meds:  Outpatient Encounter Prescriptions as of 02/01/2014  Medication Sig  . CALCIUM PO Take 1,000 mg by mouth daily.   . Cholecalciferol (VITAMIN D-3 PO) Take 1 tablet by mouth daily. 2000 IU  . Coenzyme Q10 (CO Q 10 PO) Take 300 mg by mouth daily.  . fish oil-omega-3 fatty acids 1000 MG capsule Take 1 g by mouth daily.  Marland Kitchen levothyroxine (SYNTHROID, LEVOTHROID) 25 MCG tablet Take 25 mcg by mouth daily before breakfast.  . loratadine (CLARITIN) 10 MG tablet Take 10 mg by mouth daily as needed.   . Multiple Vitamin (MULTIVITAMIN WITH MINERALS) TABS Take 1 tablet by mouth daily.  . psyllium  (METAMUCIL) 58.6 % powder Take 1 packet by mouth daily as needed. For fiber  . ranitidine (ZANTAC) 150 MG tablet Take 150 mg by mouth daily.  Marland Kitchen enoxaparin (LOVENOX) 40 MG/0.4ML injection   . fluticasone (FLONASE) 50 MCG/ACT nasal spray   . ibuprofen (ADVIL,MOTRIN) 800 MG tablet   . loratadine-pseudoephedrine (CLARITIN-D 12-HOUR) 5-120 MG per tablet Take 1 tablet by mouth daily. As needed  . meloxicam (MOBIC) 7.5 MG tablet Take 7.5 mg by mouth daily.  . Pitavastatin Calcium (LIVALO) 4 MG TABS Take 1/2 tablet daily  . Pitavastatin Calcium 4 MG TABS Take 1 tablet (4 mg total) by mouth daily.  . [DISCONTINUED] estradiol (VIVELLE-DOT) 0.0375 MG/24HR Place 1 patch onto the skin 2 (two) times a week.    Allergy:  Allergies  Allergen Reactions  . Codeine Itching    Facial flushing  . Penicillins Hives    Social Hx:   History   Social History  . Marital Status: Married    Spouse Name: N/A    Number of Children: 0  . Years of Education: N/A   Occupational History  . Not on file.   Social History Main Topics  . Smoking status: Never Smoker   . Smokeless tobacco: Not on file  . Alcohol Use: Yes     Comment: occassional  . Drug Use: No  . Sexual Activity: Not Currently    Birth Control/ Protection: Post-menopausal   Other Topics Concern  . Not on file   Social History Narrative   Patient does not have any biological children  but has step children    Past Surgical Hx:  Past Surgical History  Procedure Laterality Date  . Medial partial knee replacement      LT knee  . Other surgical history      metal pin/screw to repair arthritis in LT thumb joint  . Hand surgery      left thumb surgery  . Dilation and curettage of uterus      2008-polyp removed  . Robotic assisted total hysterectomy with bilateral salpingo oopherectomy  12/20/2012    Procedure: ROBOTIC ASSISTED TOTAL HYSTERECTOMY WITH BILATERAL SALPINGO OOPHORECTOMY;  Surgeon: Imagene Gurney A. Alycia Rossetti, MD;  Location: [facility];   Service: Gynecology;  Laterality: N/A;  WITH PELVIC PARAAORTIC   . Robotic pelvic and para-aortic lymph node dissection  12/20/2012    Procedure: ROBOTIC PELVIC AND PARA-AORTIC LYMPH NODE DISSECTION;  Surgeon: Imagene Gurney A. Alycia Rossetti, MD;  Location: [facility];  Service: Gynecology;  Laterality: Bilateral;    Past Medical Hx:  Past Medical History  Diagnosis Date  . Lupus     in remission for 5-6 yrs, not on meds now.(more skin reactions,joint pain, sores in mouth)  . PONV (postoperative nausea and vomiting)   . Seasonal allergies   . GERD (gastroesophageal reflux disease)     controls with Zantac  . Headache(784.0)     hx. migraines  . Arthritis     arthritis-hands. cervical disc degeneration  . Endometrial cancer 2014    Family Hx:  Family History  Problem Relation Age of Onset  . Stroke Mother 20    arterial wall of artery in eye   . Endometrial cancer Mother 69  . Dementia Father   . Stroke Father     "lots of many strokes"  . Aneurysm Father 3    AAA  . Multiple births Maternal Grandmother     died 5 days after delivery of unknown causes  . Colon cancer Maternal Aunt     late 109s  . Cancer Cousin     maternal cousin with an unknown form of cancer    Vitals:  Blood pressure 100/84, pulse 80, temperature 98.7 F (37.1 C), resp. rate 18, height 5' 6.3" (1.684 m), weight 165 lb 1.6 oz (74.889 kg).  Physical Exam: Well-nourished well-developed female in no acute distress.    Assessment/Plan:  68 year old with stage IA grade 3 endometrioid adenocarcinoma. 0/22 lymph nodes were involved. She had no lymphovascular space involvement. Her tumor involved only 0.3 cm with the myometrium was 1.5 cm in thickness. I reviewed with her different risk factors including the gynecologic oncology group criteria for adjuvant radiation therapy. She does not meet criteria for adjuvant radiation therapy. She can be dispositioned to close followup.  She has recurrent disease and underwent laparotomy  February 20 of a complete resection. We discussed the need for adjuvant therapy. She is amenable to proceeding with chemotherapy. We discussed that my preference would be paclitaxel and carboplatin. We discussed GOG protocol 280 6P. This is a randomized phase II study of paclitaxel and carboplatin with or without metformin. She is interested in considering this. She'll be contacted by our Research officer, political party.  I would like to get therapy started in the next 2-3 weeks.   Harold Mattes A., MD 02/01/2014, 1:02 PM

## 2014-02-05 DIAGNOSIS — E039 Hypothyroidism, unspecified: Secondary | ICD-10-CM | POA: Insufficient documentation

## 2014-02-08 ENCOUNTER — Ambulatory Visit: Payer: Medicare Other | Admitting: Gynecologic Oncology

## 2014-02-14 ENCOUNTER — Ambulatory Visit: Payer: Medicare Other | Admitting: Gynecologic Oncology

## 2014-04-10 ENCOUNTER — Telehealth: Payer: Self-pay | Admitting: *Deleted

## 2014-04-10 NOTE — Telephone Encounter (Signed)
Called pt in response to a voicemail left by patient, stating she would like to cancel her follow up appointment in June with Dr. Alycia Rossetti. Pt was asked did she want to reschedule, pt stated that she was seeing Dr. Alycia Rossetti at [facility]

## 2014-05-17 ENCOUNTER — Ambulatory Visit: Payer: Medicare Other | Admitting: Gynecologic Oncology

## 2014-06-08 ENCOUNTER — Telehealth: Payer: Self-pay | Admitting: *Deleted

## 2014-06-08 NOTE — Telephone Encounter (Signed)
Delma Freeze clinician to Dr. Marlaine Hind from [facility] called and requested a follow up appointment with Dr. Alycia Rossetti at the [facility] office. Appointment has been scheduled for 07/05/2014

## 2014-07-05 ENCOUNTER — Ambulatory Visit: Payer: Medicare Other

## 2014-07-05 ENCOUNTER — Encounter: Payer: Self-pay | Admitting: Gynecologic Oncology

## 2014-07-05 ENCOUNTER — Ambulatory Visit: Payer: Medicare Other | Attending: Gynecologic Oncology | Admitting: Gynecologic Oncology

## 2014-07-05 DIAGNOSIS — Z8542 Personal history of malignant neoplasm of other parts of uterus: Secondary | ICD-10-CM | POA: Diagnosis not present

## 2014-07-05 DIAGNOSIS — C541 Malignant neoplasm of endometrium: Secondary | ICD-10-CM

## 2014-07-05 DIAGNOSIS — K219 Gastro-esophageal reflux disease without esophagitis: Secondary | ICD-10-CM | POA: Insufficient documentation

## 2014-07-05 DIAGNOSIS — Z880 Allergy status to penicillin: Secondary | ICD-10-CM | POA: Insufficient documentation

## 2014-07-05 DIAGNOSIS — C549 Malignant neoplasm of corpus uteri, unspecified: Secondary | ICD-10-CM

## 2014-07-05 DIAGNOSIS — Z885 Allergy status to narcotic agent status: Secondary | ICD-10-CM | POA: Insufficient documentation

## 2014-07-05 DIAGNOSIS — Z79899 Other long term (current) drug therapy: Secondary | ICD-10-CM | POA: Diagnosis not present

## 2014-07-05 DIAGNOSIS — R232 Flushing: Secondary | ICD-10-CM

## 2014-07-05 MED ORDER — VENLAFAXINE HCL 37.5 MG PO TABS: 37.5000 mg | ORAL_TABLET | Freq: Two times a day (BID) | ORAL | Status: DC

## 2014-07-05 NOTE — Progress Notes (Signed)
Consult Note: Gyn-Onc  Brooke Williams 68 y.o. female  CC:  Chief Complaint  Patient presents with  . Endometrial Cancer    HPI: Patient is a 68 year old gravida 0 who went through menopause at the age of 71. Since that time she's been maintained on hormone replacement therapy. During Thanksgiving she began hemorrhaging with clots. 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 She up subsequently underwent surgery. She underwent a total robotic hysterectomy BSO bilateral para-aortic and pelvic lymph node dissection on December 20 2012. Operative findings included normal intra-abdominal anatomy. The uterus, cervix, bilateral adnexa and lymph nodes were normal. She had some physiologic adhesions of the colon to the left pelvic sidewall.  Pathology revealed:  1. Lymph node, biopsy, right para aortic  - SIX LYMPH NODES, NEGATIVE FOR METASTATIC CARCINOMA (0/6).  2. Lymph node, biopsy, left para aortic  - FIVE LYMPH NODES, NEGATIVE FOR METASTATIC CARCINOMA (0/5).  3. Uterus, ovaries and fallopian tubes, with cervix  - SUPERFICIALLY INVASIVE ENDOMETRIOID CARCINOMA, FIGO GRADE III, CONFINED WITHIN  INNER HALF OF THE MYOMETRIUM; NO EVIDENCE OF ANGIOLYMPHATIC INVASION IDENTIFIED.  - LEIOMYOMA.  - CERVIX: BENIGN SQUAMOUS MUCOSA AND ENDOCERVICAL MUCOSA, NO DYSPLASIA OR  MALIGNANCY.  - BILATERAL OVARIES AND FALLOPIAN TUBES: NO PATHOLOGIC ABNORMALITIES.  4. Lymph node, biopsy, right pelvic  - FOUR LYMPH NODES, NEGATIVE FOR METASTATIC CARCINOMA (0/4).  5. Lymph node, biopsy, left pelvic  - SEVEN LYMPH NODES, NEGATIVE FOR METASTATIC CARCINOMA (0/7).   She also has loss of expression of PMS2 and [place] 1. She was given these results by phone and I scheduled her to see genetics. Genetics testing was essentially negative.  She states that when we saw her in June she would have a very low energy. She was seen by her primary care physician October which time the TSH was drawn. She was felt to be  hypothyroid and was started on Synthroid. The TSH is subsequently normalized but does not improved her energy level. She was seen in Dr. Kennith Maes office in December. Exam, a Pap smear, and mammogram were negative. A few days after examination she began developing experiencing some left lower quadrant pain. He checked her white count to rule out colitis and ordered a CT scan. The CT scan in February 2015 revealed:  FINDINGS:  11 mm subpleural noncalcified nodule in the superior segment right lower lobe (series 3/image 6), indeterminate. While metastasis is difficult to exclude on the current CT, this appears unchanged from prior chest radiograph dated 12/16/2012, which suggests a benign etiology. Liver is notable for focal fat/altered perfusion along the falciform ligament (series 2/ image 33). Spleen, pancreas, and adrenal glands are within normal limits. Gallbladder is unremarkable. No intrahepatic or extrahepatic ductal dilatation.  1.3 cm lateral interpolar left renal cyst (series 2/ image 37). Right kidney is within normal limits. No hydronephrosis. No evidence of bowel obstruction. Normal appendix. Left colon is decompressed. No evidence of abdominal aortic aneurysm. No abdominopelvic ascites.  8.3 x 6.7 x 7.5 cm soft tissue mass beneath the left lower abdominal wall (series 2/image 68), suspicious for nodal or peritoneal metastasis. Additional 7 x 9 mm peritoneal implant beneath the left mid anterior abdominal wall (series 2/ image 60) and 12 x 18 mm peritoneal implant beneath the midline lower anterior abdominal wall (series 2/ image 70).  Status post hysterectomy and bilateral salpingo-oophorectomy.  Bladder is within normal limits.  Tiny fat containing left inguinal hernia.  Degenerative changes of the visualized thoracolumbar spine.  IMPRESSION:  Status post hysterectomy  with bilateral salpingo-oophorectomy. 8.3 cm soft tissue mass beneath the left lower abdominal wall, suspicious for nodal or  peritoneal metastasis. Additional small peritoneal nodules/metastases, as described above. 11 mm subpleural nodule in the superior segment right lower lobe, indeterminate. While metastasis is difficult to exclude, this appears unchanged from prior chest radiograph dated 12/16/2012, favoring a benign etiology. If clinically warranted, PET-CT can be performed for further evaluation.  She underwent exploratory laparotomy omentectomy and R0 resection on 01/19/2014. Findings included the omentum to the involved with an 8 x 8 cm mass. There were several smaller lesions consistent with 1-2 cm disease within the omentum. The mass is here to sigmoid colon epiploica but not to the colonic wall itself. Pelvic side wall nodule overlying the right external iliac vein measured 8 mm. There is enhancing nodule in the sacrum overlying the sigmoid colon mesentery. Pathology was consistent with metastatic poorly differentiated adenocarcinoma consistent with primary endometrial origin. This involved with the omentum, the small bowel sigmoid epiploica resection and the small bowel nodule. The right sidewall mass was negative. She comes in today for discussion of results and treatment planning.  She was recommended to undergo salvage chemotherapy and was eligible for and elected to enrol in GOG186B (carb/tax +metformin vs placebo). She commenced chemotherapy in March, 2015 and overall tolerated this well without dosing delays. She completed 6 cycles of cytotoxic (carb/taxol) therapy on 06/11/14 and has continued on metformin vs placebo. CT scan on 06/27/14 revealed no evidence of measurable disease (and thyroid, pulmonary and renal nodules/cysts were stable and benign appearing). CA 125 at that time was normal and stable at 18.3.  Interval History:   She presents today for a protocol directed scheduled visit. She is doing well. She denies diarrhea or abdominal cramps. She comments that she has improving finger tip neuropathy that  does not impact QOL. She denies pain. She reports some oral canker sores (which have resolved today) and some sores on her neck and outer ear canals which have also improved by today.   She is reporting severe vasomotor symptoms with nocturnal hot flashes and desires therapy for this. They were previously controlled with systemic estrogen, but the patient understands that she is not a good candidate for this given her recurrent endometrial cancer.   Her left clavicular head feels more prominent to her since starting chemotherapy.  Her performance status is 0.  Current Meds:  Outpatient Encounter Prescriptions as of 07/05/2014  Medication Sig  . CALCIUM PO Take 1,000 mg by mouth daily.   . Cholecalciferol (VITAMIN D-3 PO) Take 1 tablet by mouth daily. 2000 IU  . docusate sodium (STOOL SOFTENER) 100 MG capsule Take 100 mg by mouth.  Marland Kitchen ibuprofen (ADVIL,MOTRIN) 800 MG tablet   . levothyroxine (SYNTHROID, LEVOTHROID) 25 MCG tablet Take 25 mcg by mouth daily before breakfast.  . loratadine (CLARITIN) 10 MG tablet Take 10 mg by mouth daily as needed.   . loratadine-pseudoephedrine (CLARITIN-D 12-HOUR) 5-120 MG per tablet Take 1 tablet by mouth daily. As needed  . LORazepam (ATIVAN) 1 MG tablet Take 1 mg by mouth.  . Multiple Vitamin (MULTIVITAMIN WITH MINERALS) TABS Take 1 tablet by mouth daily.  . psyllium (METAMUCIL) 58.6 % powder Take 1 packet by mouth daily as needed. For fiber  . ranitidine (ZANTAC) 150 MG tablet Take 150 mg by mouth daily.  Marland Kitchen UNABLE TO FIND Take by mouth.  . Coenzyme Q10 (CO Q 10 PO) Take 300 mg by mouth daily.  . fluticasone (FLONASE) 50  MCG/ACT nasal spray   . traMADol (ULTRAM) 50 MG tablet Take 50 mg by mouth.  . triamcinolone cream (KENALOG) 0.1 %   . venlafaxine (EFFEXOR) 37.5 MG tablet Take 1 tablet (37.5 mg total) by mouth 2 (two) times daily with a meal.  . [DISCONTINUED] enoxaparin (LOVENOX) 40 MG/0.4ML injection   . [DISCONTINUED] estradiol (MINIVELLE) 0.0375  MG/24HR   . [DISCONTINUED] fish oil-omega-3 fatty acids 1000 MG capsule Take 1 g by mouth daily.  . [DISCONTINUED] meloxicam (MOBIC) 7.5 MG tablet Take 7.5 mg by mouth daily.  . [DISCONTINUED] Pitavastatin Calcium (LIVALO) 4 MG TABS Take 1/2 tablet daily  . [DISCONTINUED] Pitavastatin Calcium 4 MG TABS Take 1 tablet (4 mg total) by mouth daily.    Allergy:  Allergies  Allergen Reactions  . Codeine Itching    Facial flushing  . Penicillins Hives    Social Hx:   History   Social History  . Marital Status: Married    Spouse Name: N/A    Number of Children: 0  . Years of Education: N/A   Occupational History  . Not on file.   Social History Main Topics  . Smoking status: Never Smoker   . Smokeless tobacco: Not on file  . Alcohol Use: Yes     Comment: occassional  . Drug Use: No  . Sexual Activity: Not Currently    Birth Control/ Protection: Post-menopausal   Other Topics Concern  . Not on file   Social History Narrative   Patient does not have any biological children but has step children    Past Surgical Hx:  Past Surgical History  Procedure Laterality Date  . Medial partial knee replacement      LT knee  . Other surgical history      metal pin/screw to repair arthritis in LT thumb joint  . Hand surgery      left thumb surgery  . Dilation and curettage of uterus      2008-polyp removed  . Robotic assisted total hysterectomy with bilateral salpingo oopherectomy  12/20/2012    Procedure: ROBOTIC ASSISTED TOTAL HYSTERECTOMY WITH BILATERAL SALPINGO OOPHORECTOMY;  Surgeon: Imagene Gurney A. Alycia Rossetti, MD;  Location: [facility];  Service: Gynecology;  Laterality: N/A;  WITH PELVIC PARAAORTIC   . Robotic pelvic and para-aortic lymph node dissection  12/20/2012    Procedure: ROBOTIC PELVIC AND PARA-AORTIC LYMPH NODE DISSECTION;  Surgeon: Imagene Gurney A. Alycia Rossetti, MD;  Location: [facility];  Service: Gynecology;  Laterality: Bilateral;    Past Medical Hx:  Past Medical History  Diagnosis Date  .  Lupus     in remission for 5-6 yrs, not on meds now.(more skin reactions,joint pain, sores in mouth)  . PONV (postoperative nausea and vomiting)   . Seasonal allergies   . GERD (gastroesophageal reflux disease)     controls with Zantac  . Headache(784.0)     hx. migraines  . Arthritis     arthritis-hands. cervical disc degeneration  . Endometrial cancer 2014     Endometrial adenocarcinoma   12/01/2012 Initial Diagnosis Endometrial adenocarcinoma   12/20/2012 Surgery IAG3, no LVSI   01/10/2014 Relapse/Recurrence + peritoneal disease on CT scan   01/19/2014 Surgery R0 resection of recurrent endometrial cancer   02/02/2014 - 06/11/2014 Chemotherapy Carboplatin AUC 6 and paclitaxel 119m/m2 x 6 cycles +metformin vs placebo as part of GOG186B   06/27/2014 Imaging CT chest, abdo/pelvis with contrast (protocol directed): A 1.9cm hypodensity in right thyroid (unchanged); 1.1cm RLL pulm nodule (unchanged); 1.2cm Lt kidney hypodensity (unchanged);  No new osteolytic or osteoblastic lesions. No new sites of disease.   06/27/2014 Tumor Marker CA 125: 18.3   Family Hx:  Family History  Problem Relation Age of Onset  . Stroke Mother 66    arterial wall of artery in eye   . Endometrial cancer Mother 21  . Dementia Father   . Stroke Father     "lots of many strokes"  . Aneurysm Father 64    AAA  . Multiple births Maternal Grandmother     died 5 days after delivery of unknown causes  . Colon cancer Maternal Aunt     late 64s  . Cancer Cousin     maternal cousin with an unknown form of cancer    Physical Exam: PHYSICAL EXAMINATION Vitals: Blood pressure 117/92, pulse 89, temperature 98.5, resp rate 18, weight 166.7 pounds, height 5 foot 7 inches. Neck  Palpable prominent left clavicular head (nontender).  Cardiovascular  Pulse normal rate, regularity and rhythm. S1 and S2 normal, without any murmur, rub, or gallop.  Lungs  Clear to auscultation bilateraly, without wheezes/crackles/rhonchi. Good  air movement.  Skin  No rash/lesions/breakdown  Psychiatry  Alert and oriented to person, place, and time  Abdomen  Normoactive bowel sounds, abdomen soft, non-tender and obese. Surgical  sites intact without evidence of hernia or masses.  Genito Urinary  Vulva/vagina: Normal external female genitalia.  No lesions. No discharge or bleeding. Atrophic vaginal mucosa.  Bladder/urethra:  No lesions or masses  Cervix: surgically absent  Uterus: surgically absent  Adnexa: No palpable masses. Rectal  Good tone, no masses no cul de sac nodularity.  Extremities  No bilateral cyanosis, clubbing or edema. No rash, lesions or petiche.      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 Currently receiving metformin vs placebo. Symptomatically doing well with no apparent toxicity from therapy. She can continue current therapy. Scans on 06/27/14 show no evidence for measurable disease and CA 125 was normal and stable. She should continue with trial therapy per protocol and followup as scheduled.  With respect to vasomotor symptoms/postmenopausal symptoms: I counseled her about Effexor and we prescribed 37.5BID for her to trial. I discussed risks of abrupt stopping, and anticipated 2-4 week lag time in anticipated beneficial effects.  Followup per protocol (79month.  RDonaciano Eva MD 07/05/2014, 1:41 PM

## 2014-07-05 NOTE — Patient Instructions (Signed)
Follow ups as scheduled.

## 2014-09-06 ENCOUNTER — Ambulatory Visit: Payer: Medicare Other | Attending: Gynecologic Oncology | Admitting: Gynecologic Oncology

## 2014-09-06 ENCOUNTER — Encounter: Payer: Self-pay | Admitting: Gynecologic Oncology

## 2014-09-06 VITALS — BP 121/75 | HR 75 | Temp 98.3°F | Resp 20 | Ht 67.0 in | Wt 171.2 lb

## 2014-09-06 DIAGNOSIS — C541 Malignant neoplasm of endometrium: Secondary | ICD-10-CM | POA: Insufficient documentation

## 2014-09-06 DIAGNOSIS — Z8049 Family history of malignant neoplasm of other genital organs: Secondary | ICD-10-CM | POA: Insufficient documentation

## 2014-09-06 DIAGNOSIS — Z800 Family history of malignant neoplasm of digestive organs: Secondary | ICD-10-CM | POA: Insufficient documentation

## 2014-09-06 DIAGNOSIS — K219 Gastro-esophageal reflux disease without esophagitis: Secondary | ICD-10-CM | POA: Diagnosis not present

## 2014-09-06 DIAGNOSIS — Z90710 Acquired absence of both cervix and uterus: Secondary | ICD-10-CM | POA: Diagnosis not present

## 2014-09-06 DIAGNOSIS — Z90722 Acquired absence of ovaries, bilateral: Secondary | ICD-10-CM | POA: Insufficient documentation

## 2014-09-06 DIAGNOSIS — Z808 Family history of malignant neoplasm of other organs or systems: Secondary | ICD-10-CM | POA: Insufficient documentation

## 2014-09-06 DIAGNOSIS — Z79899 Other long term (current) drug therapy: Secondary | ICD-10-CM | POA: Insufficient documentation

## 2014-09-06 NOTE — Patient Instructions (Addendum)
I will ask Brooke Williams to schedule your next appointment with me per protocol.  Appt with Dr. Ninfa Linden  At [facility] Surgery on Oct 15 at 2:50pm [place]. [phone]

## 2014-09-06 NOTE — Progress Notes (Signed)
Consult Note: Gyn-Onc  Brooke Williams 68 y.o. female  CC:  Chief Complaint  Patient presents with  . endometrial cancer    HPI: Patient is a 68 year old gravida 0 who went through menopause at the age of 68. Since that time she's been maintained on hormone replacement therapy. During Thanksgiving she began hemorrhaging with clots. 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 She up subsequently underwent surgery. She underwent a total robotic hysterectomy BSO bilateral para-aortic and pelvic lymph node dissection on December 20 2012. Operative findings included normal intra-abdominal anatomy. The uterus, cervix, bilateral adnexa and lymph nodes were normal. She had some physiologic adhesions of the colon to the left pelvic sidewall.  Pathology revealed:  1. Lymph node, biopsy, right para aortic  - SIX LYMPH NODES, NEGATIVE FOR METASTATIC CARCINOMA (0/6).  2. Lymph node, biopsy, left para aortic  - FIVE LYMPH NODES, NEGATIVE FOR METASTATIC CARCINOMA (0/5).  3. Uterus, ovaries and fallopian tubes, with cervix  - SUPERFICIALLY INVASIVE ENDOMETRIOID CARCINOMA, FIGO GRADE III, CONFINED WITHIN  INNER HALF OF THE MYOMETRIUM; NO EVIDENCE OF ANGIOLYMPHATIC INVASION IDENTIFIED.  - LEIOMYOMA.  - CERVIX: BENIGN SQUAMOUS MUCOSA AND ENDOCERVICAL MUCOSA, NO DYSPLASIA OR  MALIGNANCY.  - BILATERAL OVARIES AND FALLOPIAN TUBES: NO PATHOLOGIC ABNORMALITIES.  4. Lymph node, biopsy, right pelvic  - FOUR LYMPH NODES, NEGATIVE FOR METASTATIC CARCINOMA (0/4).  5. Lymph node, biopsy, left pelvic  - SEVEN LYMPH NODES, NEGATIVE FOR METASTATIC CARCINOMA (0/7).   She also had loss of expression of PMS2 and MLH 1. She was given these results by phone and I scheduled her to see genetics. Genetics testing was essentially negative.  She states that when we saw her in June she would have a very low energy. She was seen by her primary care physician October which time the TSH was drawn. She was felt to be  hypothyroid and was started on Synthroid. The TSH is subsequently normalized but does not improved her energy level. She was seen in Dr. Kennith Maes office in December. Exam, a Pap smear, and mammogram were negative. A few days after examination she began developing experiencing some left lower quadrant pain. He checked her white count to rule out colitis and ordered a CT scan. The CT scan in February 2015 revealed:  FINDINGS:  11 mm subpleural noncalcified nodule in the superior segment right lower lobe (series 3/image 6), indeterminate. While metastasis is difficult to exclude on the current CT, this appears unchanged from prior chest radiograph dated 12/16/2012, which suggests a benign etiology. Liver is notable for focal fat/altered perfusion along the falciform ligament (series 2/ image 33). Spleen, pancreas, and adrenal glands are within normal limits. Gallbladder is unremarkable. No intrahepatic or extrahepatic ductal dilatation.  1.3 cm lateral interpolar left renal cyst (series 2/ image 37). Right kidney is within normal limits. No hydronephrosis. No evidence of bowel obstruction. Normal appendix. Left colon is decompressed. No evidence of abdominal aortic aneurysm. No abdominopelvic ascites.  8.3 x 6.7 x 7.5 cm soft tissue mass beneath the left lower abdominal wall (series 2/image 68), suspicious for nodal or peritoneal metastasis. Additional 7 x 9 mm peritoneal implant beneath the left mid anterior abdominal wall (series 2/ image 60) and 12 x 18 mm peritoneal implant beneath the midline lower anterior abdominal wall (series 2/ image 70).  Status post hysterectomy and bilateral salpingo-oophorectomy.  Bladder is within normal limits.  Tiny fat containing left inguinal hernia.  Degenerative changes of the visualized thoracolumbar spine.  IMPRESSION:  Status post hysterectomy  with bilateral salpingo-oophorectomy. 8.3 cm soft tissue mass beneath the left lower abdominal wall, suspicious for nodal or  peritoneal metastasis. Additional small peritoneal nodules/metastases, as described above. 11 mm subpleural nodule in the superior segment right lower lobe, indeterminate. While metastasis is difficult to exclude, this appears unchanged from prior chest radiograph dated 12/16/2012, favoring a benign etiology. If clinically warranted, PET-CT can be performed for further evaluation.  She underwent exploratory laparotomy omentectomy and R0 resection on 01/19/2014. Findings included the omentum to the involved with an 8 x 8 cm mass. There were several smaller lesions consistent with 1-2 cm disease within the omentum. The mass is here to sigmoid colon epiploica but not to the colonic wall itself. Pelvic side wall nodule overlying the right external iliac vein measured 8 mm. There is enhancing nodule in the sacrum overlying the sigmoid colon mesentery. Pathology was consistent with metastatic poorly differentiated adenocarcinoma consistent with primary endometrial origin. This involved with the omentum, the small bowel sigmoid epiploica resection and the small bowel nodule. The right sidewall mass was negative. She comes in today for discussion of results and treatment planning.  She was recommended to undergo salvage chemotherapy and was eligible for and elected to enroll in GOG186B (carb/tax +metformin vs placebo). She commenced chemotherapy in March, 2015 and overall tolerated this well without dosing delays. She completed 6 cycles of cytotoxic (carb/taxol) therapy on 06/11/14 and has continued on metformin vs placebo. CT scans on 06/27/14 and 08/29/14 revealed no evidence of measurable disease (and thyroid, pulmonary and renal nodules/cysts were stable and benign appearing). CA 125 at that time was normal.  Interval History:  She had a CT scan performed at [facility] on September 30. As compared to her prior CT scan from July 29. Your revealed a stable 1. 2 hypodense right thyroid nodule that was unchanged. There is no  pathologically enlarged supraclavicular, mediastinal, hilar, or axillary nodes identified. There is a 1.1 cm right lower lobe nodule and a tiny left upper lobe nodule that were unchanged. There were no new nodules identified. There was no evidence of in the intra-abdominal recurrent disease. The overall impression was stable pulmonary nodules. Otherwise, no evidence of recurrent or metastatic disease. She comes in today for followup. She's overall doing quite well and denies any significant complaints.  Her performance status is 0.  Review of Systems  Constitutional:Energy level improving. Denies fever. Skin: No rash  Cardiovascular: No chest pain, shortness of breath Pulmonary: No cough Gastro Intestinal: No nausea, vomiting, constipation, or diarrhea reported. No bright red blood per rectum or change in bowel movement. Loose stool today and feeling "queezy" Genitourinary: Denies vaginal bleeding and discharge.  Musculoskeletal: No myalgia, + low back pain from ? cysts Neurologic: No weakness, numbness and neuropathy from chemotherapy completely resolved Psychology: Feeling well, tired at the end of the day but can do everything that she wants to   Current Meds:  Outpatient Encounter Prescriptions as of 09/06/2014  Medication Sig  . CALCIUM PO Take 1,000 mg by mouth daily.   . Cholecalciferol (VITAMIN D-3 PO) Take 1 tablet by mouth daily. 2000 IU  . fluticasone (FLONASE) 50 MCG/ACT nasal spray   . levothyroxine (SYNTHROID, LEVOTHROID) 25 MCG tablet Take 25 mcg by mouth daily before breakfast.  . loratadine (CLARITIN) 10 MG tablet Take 10 mg by mouth daily as needed.   . Multiple Vitamin (MULTIVITAMIN WITH MINERALS) TABS Take 1 tablet by mouth daily.  . naproxen (NAPROSYN) 500 MG tablet Take 500 mg by mouth.  Marland Kitchen  psyllium (METAMUCIL) 58.6 % powder Take 1 packet by mouth daily as needed. For fiber  . triamcinolone cream (KENALOG) 0.1 %   . [DISCONTINUED] Coenzyme Q10 (CO Q 10 PO) Take 300 mg  by mouth daily.  . [DISCONTINUED] loratadine-pseudoephedrine (CLARITIN-D 12-HOUR) 5-120 MG per tablet Take 1 tablet by mouth daily. As needed  . docusate sodium (STOOL SOFTENER) 100 MG capsule Take 100 mg by mouth.  Marland Kitchen ibuprofen (ADVIL,MOTRIN) 800 MG tablet   . LORazepam (ATIVAN) 1 MG tablet Take 1 mg by mouth.  . ranitidine (ZANTAC) 150 MG tablet Take 150 mg by mouth daily.  Marland Kitchen venlafaxine (EFFEXOR) 37.5 MG tablet Take 1 tablet (37.5 mg total) by mouth 2 (two) times daily with a meal.  . [DISCONTINUED] fluconazole (DIFLUCAN) 200 MG tablet   . [DISCONTINUED] traMADol (ULTRAM) 50 MG tablet Take 50 mg by mouth.  . [DISCONTINUED] UNABLE TO FIND Take by mouth.    Allergy:  Allergies  Allergen Reactions  . Codeine Itching    Facial flushing  . Penicillins Hives    Social Hx:   History   Social History  . Marital Status: Married    Spouse Name: N/A    Number of Children: 0  . Years of Education: N/A   Occupational History  . Not on file.   Social History Main Topics  . Smoking status: Never Smoker   . Smokeless tobacco: Not on file  . Alcohol Use: Yes     Comment: occassional  . Drug Use: No  . Sexual Activity: Not Currently    Birth Control/ Protection: Post-menopausal   Other Topics Concern  . Not on file   Social History Narrative   Patient does not have any biological children but has step children    Past Surgical Hx:  Past Surgical History  Procedure Laterality Date  . Medial partial knee replacement      LT knee  . Other surgical history      metal pin/screw to repair arthritis in LT thumb joint  . Hand surgery      left thumb surgery  . Dilation and curettage of uterus      2008-polyp removed  . Robotic assisted total hysterectomy with bilateral salpingo oopherectomy  12/20/2012    Procedure: ROBOTIC ASSISTED TOTAL HYSTERECTOMY WITH BILATERAL SALPINGO OOPHORECTOMY;  Surgeon: Imagene Gurney A. Alycia Rossetti, MD;  Location: [facility];  Service: Gynecology;  Laterality: N/A;   WITH PELVIC PARAAORTIC   . Robotic pelvic and para-aortic lymph node dissection  12/20/2012    Procedure: ROBOTIC PELVIC AND PARA-AORTIC LYMPH NODE DISSECTION;  Surgeon: Imagene Gurney A. Alycia Rossetti, MD;  Location: [facility];  Service: Gynecology;  Laterality: Bilateral;    Past Medical Hx:  Past Medical History  Diagnosis Date  . Lupus     in remission for 5-6 yrs, not on meds now.(more skin reactions,joint pain, sores in mouth)  . PONV (postoperative nausea and vomiting)   . Seasonal allergies   . GERD (gastroesophageal reflux disease)     controls with Zantac  . Headache(784.0)     hx. migraines  . Arthritis     arthritis-hands. cervical disc degeneration  . Endometrial cancer 2014     Endometrial adenocarcinoma   12/01/2012 Initial Diagnosis Endometrial adenocarcinoma   12/20/2012 Surgery IAG3, no LVSI   01/10/2014 Relapse/Recurrence + peritoneal disease on CT scan   01/19/2014 Surgery R0 resection of recurrent endometrial cancer   02/02/2014 - 06/11/2014 Chemotherapy Carboplatin AUC 6 and paclitaxel 136m/m2 x 6 cycles +metformin vs  placebo as part of GOG186B   06/27/2014 Imaging CT chest, abdo/pelvis with contrast (protocol directed): A 1.9cm hypodensity in right thyroid (unchanged); 1.1cm RLL pulm nodule (unchanged); 1.2cm Lt kidney hypodensity (unchanged); No new osteolytic or osteoblastic lesions. No new sites of disease.   06/27/2014 Tumor Marker CA 125: 18.3   Family Hx:  Family History  Problem Relation Age of Onset  . Stroke Mother 57    arterial wall of artery in eye   . Endometrial cancer Mother 51  . Dementia Father   . Stroke Father     "lots of many strokes"  . Aneurysm Father 69    AAA  . Multiple births Maternal Grandmother     died 5 days after delivery of unknown causes  . Colon cancer Maternal Aunt     late 41s  . Cancer Cousin     maternal cousin with an unknown form of cancer    Physical Exam: PHYSICAL EXAMINATION Vitals: Blood pressure 121/75, pulse 75, temperature  98.3, resp rate 20, weight 171.2 pounds, height 5 foot 7 inches. Neck: No lymphadenopathy, no thyromegally  Cardiovascular: Pulse normal rate, regularity and rhythm.   Lungs: Clear to auscultation bilateraly, without wheezes/crackles/rhonchi. Good air movement.   Skin: No rash/lesions/breakdown   Psychiatry: Alert and oriented to person, place, and time   Abdomen: Normoactive bowel sounds, abdomen soft, non-tender and obese. Surgical  sites intact without evidence of hernia or masses.   Back: Well-healed transverse skin incisions. Bilaterally right greater than left there appear to be approximately a 4 cm mobile lipomas in the subcutaneous tissue. There nontender to  Genito Urinary  Vulva/vagina: Normal external female genitalia.  No lesions. No discharge or bleeding. Atrophic vaginal mucosa.  Bladder/urethra:  No lesions or masses  Cervix: surgically absent  Uterus: surgically absent  Adnexa: No palpable masses. Rectal: Good tone, no masses no cul de sac nodularity.    Extremities: No bilateral cyanosisor edema.  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 She is on GOG 186B s/p carboplatin and taxol x 6 cycles after debulking. Currently receiving metformin vs placebo. Symptomatically doing well with no apparent toxicity from therapy. Scans on 06/27/14 and 08/29/14 show no evidence for measurable disease and CA 125 was normal and stable. We will refer her to Gen. surgery for evaluation of the subcutaneous nodules. I do not believe that they represent any type of endometrial recurrence. She is a history of "cysts been removed when she was in her 30s. These could be lipomas at that time she does not recall. We reviewed her CT scan. She was given a copy of the report.  Followup per protocol.  This will be arranged by Delma Freeze.  Malacki Mcphearson A., MD 09/06/2014, 11:15 AM

## 2014-09-13 ENCOUNTER — Other Ambulatory Visit (INDEPENDENT_AMBULATORY_CARE_PROVIDER_SITE_OTHER): Payer: Self-pay | Admitting: Surgery

## 2014-10-02 ENCOUNTER — Encounter (HOSPITAL_BASED_OUTPATIENT_CLINIC_OR_DEPARTMENT_OTHER): Payer: Self-pay | Admitting: *Deleted

## 2014-10-04 NOTE — H&P (Signed)
  Brooke Williams 09/13/2014 3:28 PM Location: [facility] Patient #: [id number] DOB: 1946/05/21 Married / Language: Undefined / Race: Undefined Female  History of Present Illness (Adama Ivins A. Ninfa Linden MD; 09/13/2014 3:59 PM) Patient words: eval lipomas on back.  The patient is a 68 year old female who presents with a complaint of Mass. This is a very pleasant female referred to me by Dr.Gehrig for 2 separate masses on her back. She is approximately 3 months status post finishing chemotherapy for endometrial carcinoma which was recurrent. She noticed the mass is several months ago. As she has lost weight with chemotherapy, the masses are more noticeable and uncomfortable. She had similar masses removed when she was in her 59s which may have been lipomas. Again, these current masses are causing her to have discomfort in her back   Medication History Marjean Donna, CMA; 09/13/2014 3:32 PM) Levothyroxine Sodium (25MCG Tablet, Oral daily) Active. Naproxen (500MG  Tablet, Oral daily) Active. Triamcinolone Acetonide (0.1% Cream, External as needed) Active. Venlafaxine HCl (37.5MG  Tablet, Oral daily) Active. Ibuprofen (800MG  Tablet, Oral as needed) Active. Fluticasone Propionate (50MCG/ACT Suspension, Nasal as needed) Active. Ranitidine HCl (150MG  Capsule, Oral daily) Active. Multivitamins (Oral daily) Active. Stool Softener (100MG  Capsule, Oral daily) Active. Claritin (10MG  Capsule, Oral daily) Active. Ativan (1MG  Tablet, Oral daily) Active.  Vitals (Sonya Bynum CMA; 09/13/2014 3:33 PM) 09/13/2014 3:32 PM Weight: 173 lb Height: 66in Body Surface Area: 1.91 m Body Mass Index: 27.92 kg/m Temp.: 98.18F(Temporal)  Pulse: 102 (Regular)  BP: 122/70 (Sitting, Left Arm, Standard)    Physical Exam (Conswella Bruney A. Ninfa Linden MD; 09/13/2014 4:00 PM) The physical exam findings are as follows: Note:Well-developed, well-nourished female in no acute distress Lungs  clear to auscultation bilaterally with normal respiratory effort Cardiovascular is regular rate and rhythm with no murmurs and no peripheral edema Evaluation of her back shows 2 separate masses along the lower back bilaterally. Both are deep and approximately 3-4 cm in size and are mildly tender    Assessment & Plan (Daxton Nydam A. Ninfa Linden MD; 09/13/2014 4:01 PM) MASS ON BACK (782.2  R22.2) Impression: I discussed this with the patient and her husband in detail. I suspect that these are lipomas but given her history of malignancy, I cannot totally rule out metastatic disease. We discussed continued following first surgical excision. As she is quite uncomfortable, we will proceed to the operating room for removal of the 2 lower back masses. I discussed the risks which includes but is not limited to bleeding, infection, recurrence, et Ronney Asters. She understands and wishes to proceed

## 2014-10-05 ENCOUNTER — Ambulatory Visit (HOSPITAL_BASED_OUTPATIENT_CLINIC_OR_DEPARTMENT_OTHER): Payer: Medicare Other | Admitting: Anesthesiology

## 2014-10-05 ENCOUNTER — Encounter (HOSPITAL_BASED_OUTPATIENT_CLINIC_OR_DEPARTMENT_OTHER)
Admission: RE | Disposition: A | Discharge status: Home / Self Care | Payer: Self-pay | Source: Ambulatory Visit | Attending: Surgery

## 2014-10-05 ENCOUNTER — Encounter (HOSPITAL_BASED_OUTPATIENT_CLINIC_OR_DEPARTMENT_OTHER): Payer: Self-pay | Admitting: *Deleted

## 2014-10-05 ENCOUNTER — Ambulatory Visit (HOSPITAL_BASED_OUTPATIENT_CLINIC_OR_DEPARTMENT_OTHER)
Admission: RE | Admit: 2014-10-05 | Discharge: 2014-10-05 | Disposition: A | Discharge status: Home / Self Care | Payer: Medicare Other | Source: Ambulatory Visit | Attending: Surgery | Admitting: Surgery

## 2014-10-05 DIAGNOSIS — D171 Benign lipomatous neoplasm of skin and subcutaneous tissue of trunk: Secondary | ICD-10-CM | POA: Diagnosis not present

## 2014-10-05 DIAGNOSIS — K219 Gastro-esophageal reflux disease without esophagitis: Secondary | ICD-10-CM | POA: Diagnosis not present

## 2014-10-05 DIAGNOSIS — Z9221 Personal history of antineoplastic chemotherapy: Secondary | ICD-10-CM | POA: Insufficient documentation

## 2014-10-05 DIAGNOSIS — Z885 Allergy status to narcotic agent status: Secondary | ICD-10-CM | POA: Insufficient documentation

## 2014-10-05 DIAGNOSIS — Z8589 Personal history of malignant neoplasm of other organs and systems: Secondary | ICD-10-CM | POA: Insufficient documentation

## 2014-10-05 DIAGNOSIS — R51 Headache: Secondary | ICD-10-CM | POA: Insufficient documentation

## 2014-10-05 DIAGNOSIS — R222 Localized swelling, mass and lump, trunk: Secondary | ICD-10-CM | POA: Diagnosis present

## 2014-10-05 DIAGNOSIS — Z880 Allergy status to penicillin: Secondary | ICD-10-CM | POA: Diagnosis not present

## 2014-10-05 DIAGNOSIS — I739 Peripheral vascular disease, unspecified: Secondary | ICD-10-CM | POA: Diagnosis not present

## 2014-10-05 DIAGNOSIS — M1990 Unspecified osteoarthritis, unspecified site: Secondary | ICD-10-CM | POA: Insufficient documentation

## 2014-10-05 HISTORY — PX: MASS EXCISION: SHX2000

## 2014-10-05 LAB — POCT HEMOGLOBIN-HEMACUE: Hemoglobin: 14.2 g/dL (ref 12.0–15.0)

## 2014-10-05 SURGERY — EXCISION MASS
Anesthesia: Monitor Anesthesia Care | Site: Back

## 2014-10-05 MED ORDER — CIPROFLOXACIN IN D5W 400 MG/200ML IV SOLN
INTRAVENOUS | Status: AC
  Filled 2014-10-05: qty 200

## 2014-10-05 MED ORDER — BUPIVACAINE HCL 0.5 % IJ SOLN
INTRAMUSCULAR | Status: DC | PRN
  Administered 2014-10-05: 15 mL
  Administered 2014-10-05: 10 mL

## 2014-10-05 MED ORDER — FENTANYL CITRATE 0.05 MG/ML IJ SOLN
INTRAMUSCULAR | Status: DC | PRN
  Administered 2014-10-05: 50 ug via INTRAVENOUS

## 2014-10-05 MED ORDER — PROPOFOL 10 MG/ML IV BOLUS
INTRAVENOUS | Status: AC
  Filled 2014-10-05: qty 20

## 2014-10-05 MED ORDER — MIDAZOLAM HCL 5 MG/5ML IJ SOLN
INTRAMUSCULAR | Status: DC | PRN
  Administered 2014-10-05: 1 mg via INTRAVENOUS
  Administered 2014-10-05: 0.5 mg via INTRAVENOUS

## 2014-10-05 MED ORDER — CIPROFLOXACIN IN D5W 400 MG/200ML IV SOLN
400.0000 mg | INTRAVENOUS | Status: AC
  Administered 2014-10-05: 400 mg via INTRAVENOUS

## 2014-10-05 MED ORDER — LACTATED RINGERS IV SOLN
INTRAVENOUS | Status: DC
  Administered 2014-10-05: 12:00:00 via INTRAVENOUS

## 2014-10-05 MED ORDER — PROMETHAZINE HCL 25 MG/ML IJ SOLN: 6.2500 mg | INTRAMUSCULAR | Status: DC | PRN

## 2014-10-05 MED ORDER — FENTANYL CITRATE 0.05 MG/ML IJ SOLN: 50.0000 ug | INTRAMUSCULAR | Status: DC | PRN

## 2014-10-05 MED ORDER — BUPIVACAINE HCL (PF) 0.5 % IJ SOLN
INTRAMUSCULAR | Status: AC
  Filled 2014-10-05: qty 30

## 2014-10-05 MED ORDER — PROPOFOL INFUSION 10 MG/ML OPTIME
INTRAVENOUS | Status: DC | PRN
  Administered 2014-10-05: 50 ug/kg/min via INTRAVENOUS

## 2014-10-05 MED ORDER — OXYCODONE HCL 5 MG/5ML PO SOLN: 5.0000 mg | Freq: Once | ORAL | Status: DC | PRN

## 2014-10-05 MED ORDER — MIDAZOLAM HCL 2 MG/2ML IJ SOLN
INTRAMUSCULAR | Status: AC
  Filled 2014-10-05: qty 2

## 2014-10-05 MED ORDER — HYDROMORPHONE HCL 1 MG/ML IJ SOLN: 0.2500 mg | INTRAMUSCULAR | Status: DC | PRN

## 2014-10-05 MED ORDER — OXYCODONE HCL 5 MG PO TABS: 5.0000 mg | ORAL_TABLET | Freq: Once | ORAL | Status: DC | PRN

## 2014-10-05 MED ORDER — TRAMADOL HCL 50 MG PO TABS: 50.0000 mg | ORAL_TABLET | Freq: Four times a day (QID) | ORAL | Status: DC | PRN

## 2014-10-05 MED ORDER — BUPIVACAINE-EPINEPHRINE (PF) 0.5% -1:200000 IJ SOLN
INTRAMUSCULAR | Status: AC
  Filled 2014-10-05: qty 30

## 2014-10-05 MED ORDER — MIDAZOLAM HCL 2 MG/2ML IJ SOLN: 1.0000 mg | INTRAMUSCULAR | Status: DC | PRN

## 2014-10-05 MED ORDER — LIDOCAINE-EPINEPHRINE 1 %-1:100000 IJ SOLN
INTRAMUSCULAR | Status: AC
  Filled 2014-10-05: qty 1

## 2014-10-05 MED ORDER — FENTANYL CITRATE 0.05 MG/ML IJ SOLN
INTRAMUSCULAR | Status: AC
  Filled 2014-10-05: qty 4

## 2014-10-05 SURGICAL SUPPLY — 44 items
BENZOIN TINCTURE PRP APPL 2/3 (GAUZE/BANDAGES/DRESSINGS) ×3 IMPLANT
BLADE CLIPPER SURG (BLADE) IMPLANT
BLADE HEX COATED 2.75 (ELECTRODE) ×3 IMPLANT
BLADE SURG 15 STRL LF DISP TIS (BLADE) ×1 IMPLANT
BLADE SURG 15 STRL SS (BLADE) ×2
CANISTER SUCT 1200ML W/VALVE (MISCELLANEOUS) IMPLANT
CHLORAPREP W/TINT 26ML (MISCELLANEOUS) ×3 IMPLANT
CLOSURE WOUND 1/2 X4 (GAUZE/BANDAGES/DRESSINGS) ×1
COVER BACK TABLE 60X90IN (DRAPES) ×3 IMPLANT
COVER MAYO STAND STRL (DRAPES) ×3 IMPLANT
DECANTER SPIKE VIAL GLASS SM (MISCELLANEOUS) ×3 IMPLANT
DRAPE PED LAPAROTOMY (DRAPES) ×3 IMPLANT
DRAPE UTILITY XL STRL (DRAPES) ×3 IMPLANT
DRSG TEGADERM 4X4.75 (GAUZE/BANDAGES/DRESSINGS) IMPLANT
ELECT REM PT RETURN 9FT ADLT (ELECTROSURGICAL) ×3
ELECTRODE REM PT RTRN 9FT ADLT (ELECTROSURGICAL) ×1 IMPLANT
GLOVE SURG SIGNA 7.5 PF LTX (GLOVE) ×3 IMPLANT
GLOVE SURG SS PI 7.0 STRL IVOR (GLOVE) ×3 IMPLANT
GOWN STRL REUS W/ TWL LRG LVL3 (GOWN DISPOSABLE) ×1 IMPLANT
GOWN STRL REUS W/ TWL XL LVL3 (GOWN DISPOSABLE) ×1 IMPLANT
GOWN STRL REUS W/TWL LRG LVL3 (GOWN DISPOSABLE) ×2
GOWN STRL REUS W/TWL XL LVL3 (GOWN DISPOSABLE) ×2
NEEDLE HYPO 25X1 1.5 SAFETY (NEEDLE) ×3 IMPLANT
NS IRRIG 1000ML POUR BTL (IV SOLUTION) IMPLANT
PACK BASIN DAY SURGERY FS (CUSTOM PROCEDURE TRAY) ×3 IMPLANT
PENCIL BUTTON HOLSTER BLD 10FT (ELECTRODE) ×3 IMPLANT
SLEEVE SCD COMPRESS KNEE MED (MISCELLANEOUS) IMPLANT
SPONGE GAUZE 4X4 12PLY STER LF (GAUZE/BANDAGES/DRESSINGS) IMPLANT
SPONGE LAP 4X18 X RAY DECT (DISPOSABLE) ×3 IMPLANT
STRIP CLOSURE SKIN 1/2X4 (GAUZE/BANDAGES/DRESSINGS) ×2 IMPLANT
SUT MNCRL AB 4-0 PS2 18 (SUTURE) ×3 IMPLANT
SUT PROLENE 3 0 PS 2 (SUTURE) IMPLANT
SUT VIC AB 2-0 SH 27 (SUTURE)
SUT VIC AB 2-0 SH 27XBRD (SUTURE) IMPLANT
SUT VIC AB 3-0 SH 27 (SUTURE) ×2
SUT VIC AB 3-0 SH 27X BRD (SUTURE) ×1 IMPLANT
SYR BULB 3OZ (MISCELLANEOUS) IMPLANT
SYR CONTROL 10ML LL (SYRINGE) ×3 IMPLANT
TOWEL OR 17X24 6PK STRL BLUE (TOWEL DISPOSABLE) ×3 IMPLANT
TOWEL OR NON WOVEN STRL DISP B (DISPOSABLE) IMPLANT
TRAY DSU PREP LF (CUSTOM PROCEDURE TRAY) IMPLANT
TUBE CONNECTING 20'X1/4 (TUBING)
TUBE CONNECTING 20X1/4 (TUBING) IMPLANT
YANKAUER SUCT BULB TIP NO VENT (SUCTIONS) IMPLANT

## 2014-10-05 NOTE — Transfer of Care (Signed)
Immediate Anesthesia Transfer of Care Note  Patient: Brooke Williams  Procedure(s) Performed: Procedure(s): EXCISION OF BILATERAL LOWER BACK MASSES (N/A)  Patient Location: PACU  Anesthesia Type:MAC  Level of Consciousness: awake, oriented, sedated and patient cooperative  Airway & Oxygen Therapy: Patient Spontanous Breathing  Post-op Assessment: Report given to PACU RN  Post vital signs: Reviewed and stable  Complications: No apparent anesthesia complications

## 2014-10-05 NOTE — Anesthesia Postprocedure Evaluation (Signed)
  Anesthesia Post-op Note  Patient: Brooke Williams  Procedure(s) Performed: Procedure(s): EXCISION OF BILATERAL LOWER BACK MASSES (N/A)  Patient Location: PACU  Anesthesia Type:MAC  Level of Consciousness: awake and alert   Airway and Oxygen Therapy: Patient Spontanous Breathing  Post-op Pain: mild  Post-op Assessment: Post-op Vital signs reviewed  Post-op Vital Signs: stable  Last Vitals:  Filed Vitals:   10/05/14 1409  BP: 121/58  Pulse: 65  Temp: 36.9 C  Resp: 16    Complications: No apparent anesthesia complications

## 2014-10-05 NOTE — Anesthesia Preprocedure Evaluation (Addendum)
Anesthesia Evaluation  Patient identified by MRN, date of birth, ID band Patient awake    Reviewed: Allergy & Precautions, H&P , NPO status   History of Anesthesia Complications (+) PONV  Airway Mallampati: II   Neck ROM: Full    Dental  (+) Teeth Intact   Pulmonary  breath sounds clear to auscultation        Cardiovascular + Peripheral Vascular Disease Rhythm:Regular Rate:Normal  Hx of lupus   Neuro/Psych  Headaches,    GI/Hepatic GERD-  ,  Endo/Other    Renal/GU      Musculoskeletal  (+) Arthritis -,   Abdominal   Peds  Hematology   Anesthesia Other Findings   Reproductive/Obstetrics                            Anesthesia Physical Anesthesia Plan  ASA: II  Anesthesia Plan: MAC   Post-op Pain Management:    Induction: Intravenous  Airway Management Planned: Natural Airway and Simple Face Mask  Additional Equipment:   Intra-op Plan:   Post-operative Plan: Extubation in OR  Informed Consent: I have reviewed the patients History and Physical, chart, labs and discussed the procedure including the risks, benefits and alternatives for the proposed anesthesia with the patient or authorized representative who has indicated his/her understanding and acceptance.     Plan Discussed with:   Anesthesia Plan Comments:        Anesthesia Quick Evaluation

## 2014-10-05 NOTE — Op Note (Signed)
EXCISION OF BILATERAL LOWER BACK MASSES  Procedure Note  Brooke Williams 10/05/2014   Pre-op Diagnosis: Bilateral Lower Back Mases     Post-op Diagnosis: same  Procedure(s): EXCISION OF BILATERAL LOWER BACK MASSES  Surgeon(s): Coralie Keens, MD  Anesthesia: Monitor Anesthesia Care  Staff:  Circulator: Lauris Chroman, RN Scrub Person: Josie Dixon, RN  Estimated Blood Loss: Minimal               Specimens: sent to path (3cm left , 5 cm right )          Brooke Williams A   Date: 10/05/2014  Time: 1:16 PM

## 2014-10-05 NOTE — Discharge Instructions (Signed)
Ok to [facility] on at least one week   [facility] Instructions  Activity: Get plenty of rest for the remainder of the day. A responsible adult should stay with you for 24 hours following the procedure.  For the next 24 hours, DO NOT: -Drive a car -Paediatric nurse -Drink alcoholic beverages -Take any medication unless instructed by your physician -Make any legal decisions or sign important papers.  Meals: Start with liquid foods such as gelatin or soup. Progress to regular foods as tolerated. Avoid greasy, spicy, heavy foods. If nausea and/or vomiting occur, drink only clear liquids until the nausea and/or vomiting subsides. Call your physician if vomiting continues.  Special Instructions/Symptoms: Your throat may feel dry or sore from the anesthesia or the breathing tube placed in your throat during surgery. If this causes discomfort, gargle with warm salt water. The discomfort should disappear within 24 hours.

## 2014-10-05 NOTE — Interval H&P Note (Signed)
History and Physical Interval Note: no change in H and P  10/05/2014 11:43 AM  Brooke Williams  has presented today for surgery, with the diagnosis of Bilateral Lower Back Mases  The various methods of treatment have been discussed with the patient and family. After consideration of risks, benefits and other options for treatment, the patient has consented to  Procedure(s): EXCISION OF BILATERAL LOWER BACK MASSES (N/A) as a surgical intervention .  The patient's history has been reviewed, patient examined, no change in status, stable for surgery.  I have reviewed the patient's chart and labs.  Questions were answered to the patient's satisfaction.     [name] A

## 2014-10-08 ENCOUNTER — Encounter (HOSPITAL_BASED_OUTPATIENT_CLINIC_OR_DEPARTMENT_OTHER): Payer: Self-pay | Admitting: Surgery

## 2014-10-08 NOTE — Op Note (Signed)
[name], [name]            ACCOUNT NO.:  [id number]  MEDICAL RECORD NO.:  [id number]  LOCATION:                                 FACILITY:  PHYSICIAN:  Coralie Keens, M.D. DATE OF BIRTH:  07-12-1946  DATE OF PROCEDURE:  10/05/2014 DATE OF DISCHARGE:  10/05/2014                              OPERATIVE REPORT   PREOPERATIVE DIAGNOSIS:  Bilateral lower back masses.  POSTOPERATIVE DIAGNOSIS:  Bilateral lower back masses.  PROCEDURE:  Excision of bilateral lower back masses.  SURGEON:  Coralie Keens, M.D.  ANESTHESIA:  1% lidocaine, 0.5% Marcaine and monitored anesthesia care.  ESTIMATED BLOOD LOSS:  Minimal.  FINDINGS:  The patient was found to have a 3-cm left back and a 5-cm right back mass, both consistent with lipomas.  They were sent to Pathology for evaluation.  PROCEDURE IN DETAIL:  The patient was brought to the operating room, identified as Brooke Williams.  She was placed supine on the operating room table and then placed in the prone position.  Anesthesia was then induced.  Her lower back was then prepped and draped in usual sterile fashion.  I anesthetized the skin over both palpable masses with lidocaine.  I then made an excision over both masses with the scalpel. I then took this down to the subcutaneous tissue with electrocautery.  I removed two masses consistent with lipomas from the left and right back. The left one was 3 cm in size and the right mass was 5 cm in size.  Both were sent separately to Pathology for evaluation.  I then injected Marcaine into the incisions.  I then closed the subcutaneous tissue with interrupted 3-0 Vicryl sutures and closed the skin with running 4-0 Monocryl.  Steri-Strips and bandages were then applied.  The patient tolerated the procedure well.  All the counts were correct at the end of the procedure.  The patient was then taken in stable condition from her operating room to the recovery room.     Coralie Keens, M.D.     DB/MEDQ  D:  10/05/2014  T:  10/06/2014  Job:  [id number]

## 2014-10-08 NOTE — Addendum Note (Signed)
Addendum  created 10/08/14 1140 by Tawni Millers, CRNA   Modules edited: Anesthesia Medication Administration

## 2015-02-04 ENCOUNTER — Other Ambulatory Visit: Payer: Self-pay | Admitting: Dermatology

## 2015-05-28 ENCOUNTER — Telehealth: Payer: Self-pay | Admitting: Cardiovascular Disease

## 2015-05-28 NOTE — Telephone Encounter (Signed)
Pt returned call, I called back, got VM - [id number].

## 2015-05-28 NOTE — Telephone Encounter (Signed)
Left message to call.

## 2015-05-28 NOTE — Telephone Encounter (Signed)
Pt called in stating that she would like to come in and be checked out by Dr. Claiborne Billings because she said that she has developed a knot under her rib cage on the left side and she feels that it could be a possible aneurysm. Please call  Thanks

## 2015-05-28 NOTE — Telephone Encounter (Signed)
Spoke to patient, instructed to f/u w/ primary care, she voiced understanding. Also scheduled for routine OV for overdue yearly w/ Dr. Claiborne Billings.

## 2015-05-28 NOTE — Telephone Encounter (Signed)
Pt has knot under ribcage. This is left-sided. Per patient, she was on levaquin ~10 days, last use 2 weeks ago for sinus infection. Her pharmacist suggested to her that she could have an aneurysm (? levaquin use). She is fairly convinced this is what the issue is. She notes "knot" is painful, feels worse w/ putting on clothing constrictive at waistline, but specifies pain is above waistline.  She states wanting to be seen by Dr. Claiborne Billings, concerned this is emergent issue. I suggested her to be seen by PCP - she wants further opinion 1st. Will defer to pharmD - on concern for levaquin.

## 2015-05-28 NOTE — Telephone Encounter (Signed)
I have never heard of levaquin causing aneurysms, nothing in drug information.  Would agree patient needs to see PCP.  Of note her last visit with TK was in 09/2013, she should be scheduled to see him this summer as well.

## 2015-05-31 ENCOUNTER — Emergency Department (HOSPITAL_COMMUNITY): Payer: Medicare Other

## 2015-05-31 ENCOUNTER — Encounter (HOSPITAL_COMMUNITY): Payer: Self-pay | Admitting: Emergency Medicine

## 2015-05-31 ENCOUNTER — Emergency Department (HOSPITAL_COMMUNITY)
Admission: EM | Admit: 2015-05-31 | Discharge: 2015-05-31 | Disposition: A | Discharge status: Home / Self Care | Payer: Medicare Other | Attending: Emergency Medicine | Admitting: Emergency Medicine

## 2015-05-31 DIAGNOSIS — Z79899 Other long term (current) drug therapy: Secondary | ICD-10-CM | POA: Diagnosis not present

## 2015-05-31 DIAGNOSIS — M159 Polyosteoarthritis, unspecified: Secondary | ICD-10-CM | POA: Insufficient documentation

## 2015-05-31 DIAGNOSIS — R1909 Other intra-abdominal and pelvic swelling, mass and lump: Secondary | ICD-10-CM | POA: Insufficient documentation

## 2015-05-31 DIAGNOSIS — R1900 Intra-abdominal and pelvic swelling, mass and lump, unspecified site: Secondary | ICD-10-CM

## 2015-05-31 DIAGNOSIS — Z8679 Personal history of other diseases of the circulatory system: Secondary | ICD-10-CM | POA: Diagnosis not present

## 2015-05-31 DIAGNOSIS — K219 Gastro-esophageal reflux disease without esophagitis: Secondary | ICD-10-CM | POA: Insufficient documentation

## 2015-05-31 DIAGNOSIS — Z8542 Personal history of malignant neoplasm of other parts of uterus: Secondary | ICD-10-CM | POA: Diagnosis not present

## 2015-05-31 DIAGNOSIS — Z880 Allergy status to penicillin: Secondary | ICD-10-CM | POA: Diagnosis not present

## 2015-05-31 DIAGNOSIS — R609 Edema, unspecified: Secondary | ICD-10-CM

## 2015-05-31 MED ORDER — IOHEXOL 300 MG/ML  SOLN
100.0000 mL | Freq: Once | INTRAMUSCULAR | Status: AC | PRN
  Administered 2015-05-31: 100 mL via INTRAVENOUS

## 2015-05-31 MED ORDER — IOHEXOL 300 MG/ML  SOLN
50.0000 mL | Freq: Once | INTRAMUSCULAR | Status: AC | PRN
  Administered 2015-05-31: 50 mL via ORAL

## 2015-05-31 NOTE — Discharge Instructions (Signed)
No finding on your CT scan to explain area of discomfort under your left rib cage. No spread of cancer or need to be concerned. Follow up with your primary care physician as needed

## 2015-05-31 NOTE — ED Notes (Signed)
Pt states she has a knot under her L side of ribcage that has gradually gotten worse over the past 2 weeks.

## 2015-05-31 NOTE — ED Provider Notes (Addendum)
CSN: [id number]     Arrival date & time 05/31/15  1241 History   None    Chief Complaint  Patient presents with  . Cyst     (Consider location/radiation/quality/duration/timing/severity/associated sxs/prior Treatment) HPI Complains of swollen area immediately inferior to left rib cage anteriorly which she noticed 2 weeks ago. She denies significant pain. Area is mildly uncomfortabledeBut, but nies difficulty breathing denies other associated symptoms. Swollen areas more prominent on standing less prominent on lying supine. Patient had CT scan of her chest and abdomen on 05/13/2015 which showed bilateral pulmonary cysts otherwise unremarkable. She gets routine CT scans that she's being followed for endometrial cancer. No treatment prior to coming here. No other associated symptoms Past Medical History  Diagnosis Date  . Lupus     in remission for 5-6 yrs, not on meds now.(more skin reactions,joint pain, sores in mouth)  . PONV (postoperative nausea and vomiting)   . Seasonal allergies   . GERD (gastroesophageal reflux disease)     controls with Zantac  . Headache(784.0)     hx. migraines  . Arthritis     arthritis-hands. cervical disc degeneration  . Endometrial cancer 2014   Past Surgical History  Procedure Laterality Date  . Medial partial knee replacement      LT knee  . Other surgical history      metal pin/screw to repair arthritis in LT thumb joint  . Hand surgery      left thumb surgery  . Dilation and curettage of uterus      2008-polyp removed  . Robotic assisted total hysterectomy with bilateral salpingo oopherectomy  12/20/2012    Procedure: ROBOTIC ASSISTED TOTAL HYSTERECTOMY WITH BILATERAL SALPINGO OOPHORECTOMY;  Surgeon: Imagene Gurney A. Alycia Rossetti, MD;  Location: [facility];  Service: Gynecology;  Laterality: N/A;  WITH PELVIC PARAAORTIC   . Robotic pelvic and para-aortic lymph node dissection  12/20/2012    Procedure: ROBOTIC PELVIC AND PARA-AORTIC LYMPH NODE DISSECTION;  Surgeon:  Imagene Gurney A. Alycia Rossetti, MD;  Location: [facility];  Service: Gynecology;  Laterality: Bilateral;  . Colonoscopy    . Mass excision N/A 10/05/2014    Procedure: EXCISION OF BILATERAL LOWER BACK MASSES;  Surgeon: Coralie Keens, MD;  Location: [facility];  Service: General;  Laterality: N/A;   Family History  Problem Relation Age of Onset  . Stroke Mother 71    arterial wall of artery in eye   . Endometrial cancer Mother 28  . Dementia Father   . Stroke Father     "lots of many strokes"  . Aneurysm Father 29    AAA  . Multiple births Maternal Grandmother     died 5 days after delivery of unknown causes  . Colon cancer Maternal Aunt     late 41s  . Cancer Cousin     maternal cousin with an unknown form of cancer   History  Substance Use Topics  . Smoking status: Never Smoker   . Smokeless tobacco: Not on file  . Alcohol Use: Yes     Comment: occassional   OB History    Gravida Para Term Preterm AB TAB SAB Ectopic Multiple Living   0              Review of Systems  Constitutional: Negative.   HENT: Negative.   Respiratory: Negative.   Cardiovascular: Negative.   Gastrointestinal: Negative.   Musculoskeletal: Negative.   Skin: Negative.   Neurological: Negative.   Psychiatric/Behavioral: Negative.   All other  systems reviewed and are negative.     Allergies  Codeine and Penicillins  Home Medications   Prior to Admission medications   Medication Sig Start Date End Date Taking? Authorizing Provider  CALCIUM PO Take 1,000 mg by mouth daily.    Yes Historical Provider, MD  Cholecalciferol (VITAMIN D-3 PO) Take 1 tablet by mouth daily. 2000 IU   Yes Historical Provider, MD  fluticasone (FLONASE) 50 MCG/ACT nasal spray Place 2 sprays into both nostrils daily as needed for allergies.  09/15/13  Yes Historical Provider, MD  ibuprofen (ADVIL,MOTRIN) 800 MG tablet Take 800 mg by mouth every 8 (eight) hours as needed for headache, mild pain or moderate pain.  01/22/14   Yes Historical Provider, MD  levothyroxine (SYNTHROID, LEVOTHROID) 25 MCG tablet Take 25 mcg by mouth daily before breakfast.   Yes Historical Provider, MD  loratadine (CLARITIN) 10 MG tablet Take 10 mg by mouth daily as needed for allergies.    Yes Historical Provider, MD  Multiple Vitamin (MULTIVITAMIN WITH MINERALS) TABS Take 1 tablet by mouth daily.   Yes Historical Provider, MD  naproxen (NAPROSYN) 500 MG tablet Take 500 mg by mouth 2 (two) times daily as needed for mild pain, moderate pain or headache.  08/29/14 08/29/15 Yes Historical Provider, MD  omeprazole (PRILOSEC) 40 MG capsule Take 40 mg by mouth at bedtime as needed (heartburn).    Yes Historical Provider, MD  OVER THE COUNTER MEDICATION Take 600 mg by mouth daily. Alpha Lipoic Acid   Yes Historical Provider, MD  OVER THE COUNTER MEDICATION Take 400 mg by mouth daily. Astragalus Root   Yes Historical Provider, MD  psyllium (METAMUCIL) 58.6 % powder Take 1 packet by mouth daily as needed (fiber).    Yes Historical Provider, MD  ranitidine (ZANTAC) 150 MG tablet Take 150 mg by mouth daily.   Yes Historical Provider, MD  triamcinolone cream (KENALOG) 0.1 % Apply 1 application topically daily as needed (rash).  10/30/13  Yes Historical Provider, MD  traMADol (ULTRAM) 50 MG tablet Take 1-2 tablets (50-100 mg total) by mouth every 6 (six) hours as needed. Patient not taking: Reported on 05/31/2015 10/05/14   Coralie Keens, MD   BP 148/83 mmHg  Pulse 92  Temp(Src) 98.9 F (37.2 C) (Oral)  Resp 16  SpO2 100% Physical Exam  Constitutional: She appears well-developed and well-nourished.  HENT:  Head: Normocephalic and atraumatic.  Eyes: Conjunctivae are normal. Pupils are equal, round, and reactive to light.  Neck: Neck supple. No tracheal deviation present. No thyromegaly present.  Cardiovascular: Normal rate and regular rhythm.   No murmur heard. Pulmonary/Chest: Effort normal and breath sounds normal.  Abdominal: Soft. Bowel sounds  are normal. She exhibits no distension. There is no tenderness.  Questionable splenomegaly. No tenderness  Musculoskeletal: Normal range of motion. She exhibits no edema or tenderness.  Neurological: She is alert. Coordination normal.  Skin: Skin is warm and dry. No rash noted.  Psychiatric: She has a normal mood and affect.  Nursing note and vitals reviewed.   ED Course  Procedures (including critical care time) Labs Review Labs Reviewed - No data to display  Imaging Review No results found.   EKG Interpretation None     4:30 PM patient resting comfortably Results for orders placed or performed during the hospital encounter of 10/05/14  Hemoglobin-hemacue, POC  Result Value Ref Range   Hemoglobin 14.2 12.0 - 15.0 g/dL   Ct Abdomen Pelvis W Contrast  05/31/2015   CLINICAL DATA:  Palpable abnormality LEFT chest and abdominal wall. Worsening symptoms over the last 2 weeks. Personal history of endometrial carcinoma.  EXAM: CT ABDOMEN AND PELVIS WITH CONTRAST  TECHNIQUE: Multidetector CT imaging of the abdomen and pelvis was performed using the standard protocol following bolus administration of intravenous contrast.  CONTRAST:  37mL OMNIPAQUE IOHEXOL 300 MG/ML SOLN, 190mL OMNIPAQUE IOHEXOL 300 MG/ML SOLN  COMPARISON:  01/10/2014.  FINDINGS: Musculoskeletal:  Lumbar spondylosis, worst at L3-L4 and L4-L5.  Visible breast tissue appears within normal limits. The LEFT rib cage and chest wall is within normal limits. There is no mass lesion.  Lung Bases: Atelectasis.  Liver: Calcified granuloma in the RIGHT hepatic dome. No change from prior. Fatty infiltration adjacent to the falciform ligament of the liver.  Spleen:  Normal.  Gallbladder:  Normal.  No calcified stones.  Common bile duct:  Normal.  Pancreas:  Normal.  Adrenal glands:  Normal bilaterally.  Kidneys: Normal renal enhancement. Bilateral upper pole renal scarring. Intermediate attenuation 13 mm LEFT upper pole renal cyst is unchanged.  No renal calculi. LEFT ureter normal. RIGHT ureter normal.  Stomach:  Normal.  Small bowel:  Normal.  Colon: High position of the cecum. Normal appendix. Redundant transverse colon.  Pelvic Genitourinary:  Hysterectomy.  Normal urinary bladder.  Peritoneum: No free fluid.  No free air.  Vascular/lymphatic: Mild atherosclerosis.  Body Wall: Tiny fat containing LEFT inguinal hernia.  IMPRESSION: No acute abnormality. No mass along the visible LEFT chest or abdominal wall.   Electronically Signed   By: Dereck Ligas M.D.   On: 05/31/2015 15:51    MDM  CT scan abdomen pelvis ordered in light of new finding on exam, since last CT scan., Meaning prominence at left upper quadrant. Final diagnoses:  None   Discomfort is nonspecific.  planreturn as needed or follow-up with primary care physician.  Dx: Abdominal swelling     Orlie Dakin, MD 05/31/15 0100  Orlie Dakin, MD 05/31/15 [phone]

## 2015-06-19 ENCOUNTER — Encounter: Payer: Self-pay | Admitting: Cardiovascular Disease

## 2015-06-20 ENCOUNTER — Encounter: Payer: Self-pay | Admitting: Cardiovascular Disease

## 2015-06-20 ENCOUNTER — Other Ambulatory Visit: Payer: Self-pay | Admitting: Obstetrics and Gynecology

## 2015-06-20 DIAGNOSIS — N644 Mastodynia: Secondary | ICD-10-CM

## 2015-06-25 ENCOUNTER — Other Ambulatory Visit (HOSPITAL_COMMUNITY): Payer: Self-pay | Admitting: Rheumatology

## 2015-06-25 DIAGNOSIS — M1990 Unspecified osteoarthritis, unspecified site: Secondary | ICD-10-CM

## 2015-06-28 ENCOUNTER — Ambulatory Visit (HOSPITAL_COMMUNITY): Payer: Medicare Other

## 2015-06-28 ENCOUNTER — Encounter (HOSPITAL_COMMUNITY): Payer: Medicare Other

## 2015-07-01 ENCOUNTER — Other Ambulatory Visit: Payer: Self-pay | Admitting: Gynecologic Oncology

## 2015-07-01 DIAGNOSIS — C541 Malignant neoplasm of endometrium: Secondary | ICD-10-CM

## 2015-07-30 ENCOUNTER — Ambulatory Visit
Admission: RE | Admit: 2015-07-30 | Discharge: 2015-07-30 | Disposition: A | Discharge status: Home / Self Care | Payer: Medicare Other | Source: Ambulatory Visit | Attending: Obstetrics and Gynecology | Admitting: Obstetrics and Gynecology

## 2015-07-30 DIAGNOSIS — N644 Mastodynia: Secondary | ICD-10-CM

## 2015-08-28 ENCOUNTER — Ambulatory Visit (INDEPENDENT_AMBULATORY_CARE_PROVIDER_SITE_OTHER): Payer: Medicare Other | Admitting: Cardiovascular Disease

## 2015-08-28 ENCOUNTER — Encounter: Payer: Self-pay | Admitting: Cardiovascular Disease

## 2015-08-28 VITALS — BP 126/70 | HR 74 | Ht 67.0 in | Wt 182.7 lb

## 2015-08-28 DIAGNOSIS — E785 Hyperlipidemia, unspecified: Secondary | ICD-10-CM

## 2015-08-28 DIAGNOSIS — Z79899 Other long term (current) drug therapy: Secondary | ICD-10-CM | POA: Diagnosis not present

## 2015-08-28 DIAGNOSIS — Z8249 Family history of ischemic heart disease and other diseases of the circulatory system: Secondary | ICD-10-CM | POA: Diagnosis not present

## 2015-08-28 DIAGNOSIS — C541 Malignant neoplasm of endometrium: Secondary | ICD-10-CM

## 2015-08-28 DIAGNOSIS — R0789 Other chest pain: Secondary | ICD-10-CM | POA: Insufficient documentation

## 2015-08-28 NOTE — Patient Instructions (Signed)
Your physician has requested that you have an exercise tolerance test. For further information please visit [email or web address]. Please also follow instruction sheet, as given.  Your physician recommends that you return for lab work fasting.  Your physician recommends that you schedule a follow-up appointment in: 3 months.

## 2015-08-28 NOTE — Progress Notes (Signed)
Patient ID: Brooke Williams, female   DOB: Feb 15, 1946, 69 y.o.   MRN: [id number]     HPI: [name] is a 69 y.o. female who presents for 2-year cardiology evaluation.  Ms. Gural has a history of lupus erythematosus, hyperlipidemia, and strong family history for premature coronary disease. 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 In the past I tried initiating therapy with Simcor 500/20 due to her lipid parameters but she was unable to tolerate this. Subsequently, we tried low-dose Crestor but again she stopped it secondary to development of vague myalgias.  She was diagnosed with endometrial cancer and underwent complete hysterectomy in January 2014.  Initially she did not require chemotherapy or radiation.  However, since I last saw her , she develop recurrence in 2015  and required surgery in February 2015.  She received paclitaxel and carboplatin chemotherapy.  She also has had several procedures for removal of lipoma from her lower back.  She is involved in clinical research study at [facility] evaluating the use of metformin for recurrent cancer.  She brought with her her recent CT scan of her chest, abdomen and pelvic region from 03/11/2015, which I reviewed.  She tells me over the past several months.  Her sister required placement of 2 coronary stents.  Her mother's twin sister underwent CABG surgery this week.  She denies classic chest pain.  She has noticed a vague chest sensation when she lies on her side.  She is concerned about her family history for CAD.  She presents for evaluation.  Past Medical History  Diagnosis Date  . Lupus     in remission for 5-6 yrs, not on meds now.(more skin reactions,joint pain, sores in mouth)  . PONV (postoperative  nausea and vomiting)   . Seasonal allergies   . GERD (gastroesophageal reflux disease)     controls with Zantac  . Headache(784.0)     hx. migraines  . Arthritis     arthritis-hands. cervical disc degeneration  . Endometrial cancer 2014    Past Surgical History  Procedure Laterality Date  . Medial partial knee replacement      LT knee  . Other surgical history      metal pin/screw to repair arthritis in LT thumb joint  . Hand surgery      left thumb surgery  . Dilation and curettage of uterus      2008-polyp removed  . Robotic assisted total hysterectomy with bilateral salpingo oopherectomy  12/20/2012    Procedure: ROBOTIC ASSISTED TOTAL HYSTERECTOMY WITH BILATERAL SALPINGO OOPHORECTOMY;  Surgeon: Imagene Gurney A. Alycia Rossetti, MD;  Location: [facility];  Service: Gynecology;  Laterality: N/A;  WITH PELVIC PARAAORTIC   . Robotic pelvic and para-aortic lymph node dissection  12/20/2012    Procedure: ROBOTIC PELVIC AND PARA-AORTIC LYMPH NODE DISSECTION;  Surgeon: Imagene Gurney A. Alycia Rossetti, MD;  Location: [facility];  Service: Gynecology;  Laterality: Bilateral;  . Colonoscopy    . Mass excision N/A 10/05/2014    Procedure: EXCISION OF BILATERAL LOWER BACK MASSES;  Surgeon: Coralie Keens, MD;  Location: [facility];  Service: General;  Laterality: N/A;    Allergies  Allergen Reactions  . Codeine Itching    Facial flushing  . Penicillins Hives    Current Outpatient Prescriptions  Medication Sig Dispense Refill  . AZELASTINE HCL NA Place 205.5 mcg into the nose daily as needed.    Marland Kitchen BLACK COHOSH PO Take 800 mg by mouth daily.    Marland Kitchen CALCIUM PO Take 1,000 mg by mouth daily.     . Cholecalciferol (VITAMIN D-3 PO) Take 1 tablet by mouth daily. 2000 IU    . fexofenadine-pseudoephedrine (ALLEGRA-D ALLERGY & CONGESTION) 60-120 MG 12 hr tablet Take 1 tablet by mouth 2 (two) times daily as needed.    . fluticasone (FLONASE) 50 MCG/ACT nasal spray Place 1 spray into both nostrils daily as needed for  allergies.     Marland Kitchen ibuprofen (ADVIL,MOTRIN) 800 MG tablet Take 800 mg by mouth every 8 (eight) hours as needed for headache, mild pain or moderate pain.     Astrid Drafts Omega-3 500 MG CAPS Take 2 capsules by mouth daily.    . Multiple Vitamin (MULTIVITAMIN WITH MINERALS) TABS Take 1 tablet by mouth daily.    Marland Kitchen OVER THE COUNTER MEDICATION Take 600 mg by mouth daily. Alpha Lipoic Acid    . OVER THE COUNTER MEDICATION Take 400 mg by mouth daily. Astragalus Root    . psyllium (METAMUCIL) 0.52 G capsule Take 0.52 g by mouth daily.    . ranitidine (ZANTAC) 150 MG tablet Take 150 mg by mouth daily as needed.     . triamcinolone cream (KENALOG) 0.1 % Apply 1 application topically daily as needed (rash).      No current facility-administered medications for this visit.    Social History   Social History  . Marital Status: Married    Spouse Name: N/A  . Number of Children: 0  . Years of Education: N/A   Occupational History  . Not on file.   Social History Main Topics  . Smoking status: Never Smoker   . Smokeless tobacco: Not on file  . Alcohol Use: Yes     Comment: occassional  . Drug Use: No  . Sexual Activity: Not Currently    Birth Control/ Protection: Post-menopausal   Other Topics Concern  . Not on file   Social History Narrative   Patient does not have any biological children but has step children    Family History  Problem Relation Age of Onset  . Stroke Mother 71    arterial wall of artery in eye   . Endometrial cancer Mother 72  . Dementia Father   . Stroke Father     "lots of many strokes"  . Aneurysm Father 68    AAA  . Multiple births Maternal Grandmother     died 5 days after delivery of unknown causes  . Colon cancer Maternal Aunt     late 67s  . Cancer Cousin     maternal cousin with an unknown form of cancer   Additional social history is that I take care of her husband Mr. Debera Sterba. There is no history of ever smoking tobacco. Occasional alcohol  use. She does try to exercise.  ROS General: Negative; No fevers, chills, or night sweats;  HEENT: Negative; No changes in vision or hearing, sinus congestion, difficulty swallowing Pulmonary: Negative; No cough, wheezing, shortness of breath, hemoptysis Cardiovascular: Negative; No chest pain, presyncope, syncope, palpitations GI: Negative; No nausea, vomiting, diarrhea, or abdominal pain GU: Negative; No dysuria, hematuria, or difficulty voiding Musculoskeletal: Negative; no myalgias, joint pain, or weakness Hematologic/Oncology: Positive for endometrial cancer Endocrine: Negative; no heat/cold intolerance; no diabetes Neuro: Negative; no changes in  balance, headaches Skin: Negative; No rashes or skin lesions Psychiatric: Negative; No behavioral problems, depression Sleep: Negative; No snoring, daytime sleepiness, hypersomnolence, bruxism, restless legs, hypnogognic hallucinations, no cataplexy Other comprehensive 14 point system review is negative.  PE BP 126/70 mmHg  Pulse 74  Ht 5' 7"  (1.702 m)  Wt 182 lb 11.2 oz (82.872 kg)  BMI 28.61 kg/m2 Wt Readings from Last 3 Encounters:  08/28/15 182 lb 11.2 oz (82.872 kg)  10/05/14 169 lb 6 oz (76.828 kg)  09/06/14 171 lb 3.2 oz (77.656 kg)   General: Alert, oriented, no distress.  Skin: normal turgor, no rashes HEENT: Normocephalic, atraumatic. Pupils round and reactive; sclera anicteric;no lid lag.  Nose without nasal septal hypertrophy Mouth/Parynx benign; Mallinpatti scale 2 Neck: No JVD, no carotid bruits normal carotid upstroke Lungs: clear to ausculatation and percussion; no wheezing or rales Heart: RRR, s1 s2 normal, 1/ 6 systolic murmur along the left sternal border.  No diastolic murmur.  No rubs thrills or heaves. Abdomen: soft, nontender; no hepatosplenomehaly, BS+; abdominal aorta nontender and not dilated by palpation. Pulses 2+ Extremities: no clubbing cyanosis or edema, Homan's sign negative  Neurologic: grossly  nonfocal Psychologic: normal affect and mood.  ECG (independently read by me): Normal sinus rhythm at 74 bpm.  No ectopy.  Normal intervals.  ECG: Sinus rhythm at 69 beats per minute. No ectopy. Normal intervals.  LABS: BMP Latest Ref Rng 12/21/2012 12/16/2012 10/16/2012  Glucose 70 - 99 mg/dL 129(H) 91 86  BUN 6 - 23 mg/dL 9 20 21   Creatinine 0.50 - 1.10 mg/dL 0.62 0.63 0.73  Sodium 135 - 145 mEq/L 141 143 142  Potassium 3.5 - 5.1 mEq/L 3.7 4.0 3.9  Chloride 96 - 112 mEq/L 108 106 107  CO2 19 - 32 mEq/L 24 26 26   Calcium 8.4 - 10.5 mg/dL 8.8 10.2 9.3   Hepatic Function Latest Ref Rng 12/16/2012 10/16/2012  Total Protein 6.0 - 8.3 g/dL 7.5 7.2  Albumin 3.5 - 5.2 g/dL 4.3 3.9  AST 0 - 37 U/L 31 28  ALT 0 - 35 U/L 42(H) 38(H)  Alk Phosphatase 39 - 117 U/L 84 83  Total Bilirubin 0.3 - 1.2 mg/dL 0.4 0.2(L)   CBC Latest Ref Rng 10/05/2014 12/21/2012 12/16/2012  WBC 4.0 - 10.5 K/uL - 11.7(H) 6.3  Hemoglobin 12.0 - 15.0 g/dL 14.2 12.5 14.5  Hematocrit 36.0 - 46.0 % - 38.1 43.1  Platelets 150 - 400 K/uL - 237 245   Lab Results  Component Value Date   MCV 93.2 12/21/2012   MCV 92.1 12/16/2012   MCV 92.2 10/16/2012   No results found for: TSH  No results found for: HGBA1C   Lipid Panel  No results found for: CHOL, TRIG, HDL, CHOLHDL, VLDL, LDLCALC, LDLDIRECT   RADIOLOGY: No results found.  Recent NMR lipoprotein was performed and this shows significant increase LDL particle #2251, LDL calculated 152, HDL 54, triglycerides 180, total cholesterol 242, small LDL particle #1427, and insulin resistance coronary increased at 49.   ASSESSMENT AND PLAN: Mrs. Kiari Hosmer is a 69 year old female who has a strong family history for premature coronary artery disease and over the past several months.  A sister underwent coronary stenting in her mother's twin sister underwent CABG surgery 3.  In the past, we tried initiating lipid-lowering therapy, but she discontinue this secondary to  myalgias.  When she was last seen we've an attempted Livalo but she is not on this presently.  I am recommending a complete  set of blood work be obtained in the fasting state.  At times she does note some vague atypical chest pain characteristics.  With her strong family history for coronary artery disease, as well as her previous genetic testing indicating increased risk, I am recommending that she undergo a routine graded exercise treadmill test for risk stratification.  I will see her in the office in 3 months for reevaluation.  Time spent: 25 minutes  Troy Sine, MD, [facility]  08/28/2015 9:43 AM

## 2015-09-02 ENCOUNTER — Other Ambulatory Visit: Payer: Self-pay | Admitting: Gynecologic Oncology

## 2015-09-05 ENCOUNTER — Encounter: Payer: Self-pay | Admitting: Neurology

## 2015-09-05 ENCOUNTER — Ambulatory Visit (INDEPENDENT_AMBULATORY_CARE_PROVIDER_SITE_OTHER): Payer: Medicare Other | Admitting: Neurology

## 2015-09-05 VITALS — BP 125/87 | HR 91 | Ht 67.0 in | Wt 181.0 lb

## 2015-09-05 DIAGNOSIS — G43719 Chronic migraine without aura, intractable, without status migrainosus: Secondary | ICD-10-CM | POA: Diagnosis not present

## 2015-09-05 NOTE — Patient Instructions (Signed)
-   MRI with and without contrast - decrease anxiety and stress level, improve sleep always good for the headache management. - continue follow up with your other physicians as scheduled - follow up in one month.

## 2015-09-06 LAB — CBC
HCT: 40.9 % (ref 36.0–46.0)
Hemoglobin: 13.8 g/dL (ref 12.0–15.0)
MCH: 31.7 pg (ref 26.0–34.0)
MCHC: 33.7 g/dL (ref 30.0–36.0)
MCV: 93.8 fL (ref 78.0–100.0)
MPV: 10.4 fL (ref 8.6–12.4)
Platelets: 246 10*3/uL (ref 150–400)
RBC: 4.36 MIL/uL (ref 3.87–5.11)
RDW: 13.8 % (ref 11.5–15.5)
WBC: 6 10*3/uL (ref 4.0–10.5)

## 2015-09-06 NOTE — Progress Notes (Signed)
NEUROLOGY CLINIC NEW PATIENT NOTE  NAME: Brooke Williams DOB: 12-22-1945 REFERRING PHYSICIAN: Stephens Shire, MD  I saw Brooke Williams as a new consult in the neurovascular clinic today regarding  Chief Complaint  Patient presents with  . Referral    headaches  .  HPI: Brooke Williams is a 69 y.o. female with PMH of lupus, endometrial cancer s/p hysterectomy and chemotherapy, lipoma s/p surgery who presents as a new patient for Headache.  Patient stated that she was diagnosed with endometrial cancer and had robotic hysterectomy in January 2014. However, in February 2015, she was found to have recurrent focal soft tissue tumor at the site of hysterectomy, consider endometrial cancer recurrence, she received chemotherapy for about 6 months. 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 Had a some of the lipoma removed.   She has long-standing migraine headache for more than 30 years. The headache involves whole head, bandage like, 5-10/10 severity, associated with photophobia, no phonophobia or nausea vomiting. It happens several times a week, lasting 2 days, she has to lie down to help the headache. No identifiable triggers. She has tried more than 20 different medications, his symptoms only Robaxin and later gabapentin helped to take the edge off the pain. Since 2013, the headache getting worse both in severity and the frequency. However, for the last 4 months, the headaches changed their pattern, became frontal headache currently, pressure type headache, constant, never went away, associate with joint ice, nose irritation, fatigue all day long. Gabapentin does not work this time, also tried icing, rubbing, topical cream, nothing worked. Also has intermittent blurry vision, she is going to see an eye doctor tomorrow.   She has history of a hypothyroidism, on Synthroid supplement. She went to see endocrinologist, checked TSH and T4 within  normal limits, and also ruled out other endocrine disorders. However she was advised to have MRI done to rule out pituitary tumor due to complaints of blurred vision.  She also follows with rheumatology for her lupus, she has had lupus since 2008, has been treated with plaqenil. Currently off the medication last oncology visit in July 2016 showed normal C3, C4, RF, ESR, negative ANA and Cardiolipin antibodies.  She has sleep study for 5 years ago was told OSA, therefore not qualify for CPAP machine. However, she has constant insomnia, usually spend an hour before falling asleep, and able to sleep 2 hours longest.   She denies any smoking, alcohol drinking, or illicit drugs.  Past Medical History  Diagnosis Date  . Lupus ([place])     in remission for 5-6 yrs, not on meds now.(more skin reactions,joint pain, sores in mouth)  . PONV (postoperative nausea and vomiting)   . Seasonal allergies   . GERD (gastroesophageal reflux disease)     controls with Zantac  . Headache(784.0)     hx. migraines  . Arthritis     arthritis-hands. cervical disc degeneration  . Endometrial cancer ([place]) 2014  . Lupus [facility])    Past Surgical History  Procedure Laterality Date  . Medial partial knee replacement      LT knee  . Other surgical history      metal pin/screw to repair arthritis in LT thumb joint  . Hand surgery      left thumb surgery  . Dilation and curettage of uterus      2008-polyp removed  . Robotic assisted total hysterectomy with bilateral salpingo oopherectomy  12/20/2012  Procedure: ROBOTIC ASSISTED TOTAL HYSTERECTOMY WITH BILATERAL SALPINGO OOPHORECTOMY;  Surgeon: Imagene Gurney A. Alycia Rossetti, MD;  Location: [facility];  Service: Gynecology;  Laterality: N/A;  WITH PELVIC PARAAORTIC   . Robotic pelvic and para-aortic lymph node dissection  12/20/2012    Procedure: ROBOTIC PELVIC AND PARA-AORTIC LYMPH NODE DISSECTION;  Surgeon: Imagene Gurney A. Alycia Rossetti, MD;  Location: [facility];  Service: Gynecology;  Laterality:  Bilateral;  . Colonoscopy    . Mass excision N/A 10/05/2014    Procedure: EXCISION OF BILATERAL LOWER BACK MASSES;  Surgeon: Coralie Keens, MD;  Location: [facility];  Service: General;  Laterality: N/A;   Family History  Problem Relation Age of Onset  . Stroke Mother 65    arterial wall of artery in eye   . Endometrial cancer Mother 16  . Dementia Father   . Stroke Father     "lots of many strokes"  . Aneurysm Father 62    AAA  . Multiple births Maternal Grandmother     died 5 days after delivery of unknown causes  . Colon cancer Maternal Aunt     late 20s  . Cancer Cousin     maternal cousin with an unknown form of cancer   Current Outpatient Prescriptions  Medication Sig Dispense Refill  . AZELASTINE HCL NA Place 205.5 mcg into the nose daily as needed.    Marland Kitchen BLACK COHOSH PO Take 800 mg by mouth daily.    Marland Kitchen CALCIUM PO Take 1,000 mg by mouth daily.     . Cholecalciferol (VITAMIN D-3 PO) Take 1 tablet by mouth daily. 2000 IU    . fexofenadine-pseudoephedrine (ALLEGRA-D ALLERGY & CONGESTION) 60-120 MG 12 hr tablet Take 1 tablet by mouth 2 (two) times daily as needed.    . fluocinonide gel (LIDEX) 0.05 % APPLY TO AFFECTED AREA EVERY DAY AS NEEDED  1  . fluticasone (FLONASE) 50 MCG/ACT nasal spray Place 1 spray into both nostrils daily as needed for allergies.     Marland Kitchen ibuprofen (ADVIL,MOTRIN) 800 MG tablet Take 800 mg by mouth every 8 (eight) hours as needed for headache, mild pain or moderate pain.     Astrid Drafts Omega-3 500 MG CAPS Take 2 capsules by mouth daily.    . magic mouthwash SOLN   11  . Multiple Vitamin (MULTIVITAMIN WITH MINERALS) TABS Take 1 tablet by mouth daily.    . mupirocin ointment (BACTROBAN) 2 % APPLY TO THE INSIDE OF BOTH NOSTRILS USING QTIP TWICE DAILY  1  . nystatin (MYCOSTATIN) 100000 UNIT/ML suspension     . OVER THE COUNTER MEDICATION Take 600 mg by mouth daily. Alpha Lipoic Acid    . OVER THE COUNTER MEDICATION Take 400 mg by mouth  daily. Astragalus Root    . psyllium (METAMUCIL) 0.52 G capsule Take 0.52 g by mouth daily.    . ranitidine (ZANTAC) 150 MG tablet Take 150 mg by mouth daily as needed.     Marland Kitchen SYNTHROID 50 MCG tablet TAKE 1 TABLET ON EMPTY STOMACH BEFORE BREAKFAST ONCE A DAY FOR THYROID (DAW)  12  . triamcinolone cream (KENALOG) 0.1 % Apply 1 application topically daily as needed (rash).      No current facility-administered medications for this visit.   Allergies  Allergen Reactions  . Codeine Itching    Facial flushing  . Penicillins Hives   Social History   Social History  . Marital Status: Married    Spouse Name: N/A  . Number of Children: 0  .  Years of Education: N/A   Occupational History  . Not on file.   Social History Main Topics  . Smoking status: Never Smoker   . Smokeless tobacco: Not on file  . Alcohol Use: Yes     Comment: occassional  . Drug Use: No  . Sexual Activity: Not Currently    Birth Control/ Protection: Post-menopausal   Other Topics Concern  . Not on file   Social History Narrative   Patient does not have any biological children but has step children    Review of Systems Full 14 system review of systems performed and notable only for those listed, all others are neg:  Constitutional:   Weight gain, fatigue Cardiovascular:  Ear/Nose/Throat:   Ringing years Skin:  Eyes:   Blurry vision Respiratory:   Snoring Gastroitestinal:   Genitourinary:  Hematology/Lymphatic:   Easy bleeding Endocrine:  feeling hot, flushing Musculoskeletal:   Leg cramps Allergy/Immunology:   Skin sensitivity, frequent infections Neurological:   Headache Psychiatric:  Sleep: insomnia, snoring    Physical Exam  Filed Vitals:   09/05/15 1345  BP: 125/87  Pulse: 91    General - obese, well developed, in no apparent distress.  Ophthalmologic - Sharp disc margins OU, no papilledema.  Cardiovascular - Regular rate and rhythm with no murmur. Carotid pulses were 2+ without  bruits .   Neck - supple, no nuchal rigidity.  Mental Status -  Level of arousal and orientation to time, place, and person were intact. Language including expression, naming, repetition, comprehension, reading, and writing was assessed and found intact. Attention span and concentration were normal. Recent and remote memory were intact. Fund of Knowledge was assessed and was intact.  Cranial Nerves II - XII - II - Visual field intact OU. III, IV, VI - Extraocular movements intact. V - Facial sensation intact bilaterally. VII - Facial movement intact bilaterally. VIII - Hearing & vestibular intact bilaterally. X - Palate elevates symmetrically. XI - Chin turning & shoulder shrug intact bilaterally. XII - Tongue protrusion intact.  Motor Strength - The patient's strength was normal in all extremities and pronator drift was absent.  Bulk was normal and fasciculations were absent.   Motor Tone - Muscle tone was assessed at the neck and appendages and was normal.  Reflexes - The patient's reflexes were normal in all extremities and she had no pathological reflexes.  Sensory - Light touch, temperature/pinprick, vibration and proprioception, and Romberg testing were assessed and were normal.    Coordination - The patient had normal movements in the hands and feet with no ataxia or dysmetria.  Tremor was absent.  Gait and Station - The patient's transfers, posture, gait, station, and turns were observed as normal.   Imaging  I have personally reviewed the radiological images below and agree with the radiology interpretations.  MRI head without contrast 10/2012 Bilateral supratentorial subcentimeter foci of white matter signal abnormality; see discussion above. No acute intracranial abnormality.  Lab Review 06/17/15 Creatinine 0.79, AST 25, ALT 31, WBC 5.6, hemoglobin 14, platelet 241, ESR 17, TSH 3.246, C3 143, C4 33, RF less than 10, ANA negative, serum protein electrophoresis  within normal limits, cardiolipin antibodies negative, anti-double strand DNA negative, SSA, SSB, anti-Smith antibody, SCL 70 all negative.  08/16/2015 TSH 2.73, free T4 1.54    Assessment and Plan:   In summary, Brooke Williams is a 69 y.o. female with PMH of lupus off plaquenil, endometrial cancer s/p hysterectomy and chemotherapy, lipoma s/p surgery, hypothyroidism, borderline OSA, and  long-standing migraine headache presents constant frontal mild headache for the last 4 months without relief which is different from her previous migraine headache. She went to see endocrinologist for hypothyroidism, TSH and free T4 normal, ruled out other endocrine disorders, however recommend to have MRI done to rule out pituitary tumor due to complains of blurry vision. On exam, there is no visual field deficit, and fundi exam was normal. Headache most consistent with tension headache due to anxiety. But would like to have MRI with and without contrast to rule out structural lesions in the brain given history of multiple benign and malignant tumors and history of lupus. May consider sleep study in the next visit.  - MRI with and without contrast - decrease anxiety and stress level, improve sleep always good for the headache management. - Consider sleep study next visit - continue follow up with your other physicians as scheduled - follow up in one month.  Thank you very much for the opportunity to participate in the care of this patient.  Please do not hesitate to call if any questions or concerns arise.  A total of 60 minutes was spent face-to-face with this patient. Over half this time was spent on counseling patient on the HA diagnosis and different diagnostic and therapeutic options available.    Orders Placed This Encounter  Procedures  . MR Brain W Wo Contrast    Standing Status: Future     Number of Occurrences:      Standing Expiration Date: 11/06/2016    Order Specific Question:  Reason for  Exam (SYMPTOM  OR DIAGNOSIS REQUIRED)    Answer:  headache    Order Specific Question:  Preferred imaging location?    Answer:  Internal    Order Specific Question:  Does the patient have a pacemaker or implanted devices?    Answer:  No    Order Specific Question:  What is the patient's sedation requirement?    Answer:  No Sedation    Meds ordered this encounter  Medications  . fluocinonide gel (LIDEX) 0.05 %    Sig: APPLY TO AFFECTED AREA EVERY DAY AS NEEDED    Refill:  1  . SYNTHROID 50 MCG tablet    Sig: TAKE 1 TABLET ON EMPTY STOMACH BEFORE BREAKFAST ONCE A DAY FOR THYROID (DAW)    Refill:  12  . mupirocin ointment (BACTROBAN) 2 %    Sig: APPLY TO THE INSIDE OF BOTH NOSTRILS USING QTIP TWICE DAILY    Refill:  1  . nystatin (MYCOSTATIN) 100000 UNIT/ML suspension    Sig:   . magic mouthwash SOLN    Sig:     Refill:  11    Patient Instructions  - MRI with and without contrast - decrease anxiety and stress level, improve sleep always good for the headache management. - continue follow up with your other physicians as scheduled - follow up in one month.   Rosalin Hawking, MD PhD [facility] [address] [place], [place] [address] [phone]

## 2015-09-07 LAB — LIPID PANEL
Cholesterol: 216 mg/dL — ABNORMAL HIGH (ref 125–200)
HDL: 58 mg/dL (ref >=46)
LDL Cholesterol: 126 mg/dL (ref <130)
Total CHOL/HDL Ratio: 3.7 Ratio (ref <=5.0)
Triglycerides: 160 mg/dL — ABNORMAL HIGH (ref <150)
VLDL: 32 mg/dL — ABNORMAL HIGH (ref <30)

## 2015-09-07 LAB — COMPREHENSIVE METABOLIC PANEL
ALT: 29 U/L (ref 6–29)
AST: 21 U/L (ref 10–35)
Albumin: 4 g/dL (ref 3.6–5.1)
Alkaline Phosphatase: 65 U/L (ref 33–130)
BUN: 20 mg/dL (ref 7–25)
CO2: 23 mmol/L (ref 20–31)
Calcium: 9.1 mg/dL (ref 8.6–10.4)
Chloride: 110 mmol/L (ref 98–110)
Creat: 0.72 mg/dL (ref 0.50–0.99)
Glucose, Bld: 75 mg/dL (ref 65–99)
Potassium: 4 mmol/L (ref 3.5–5.3)
Sodium: 144 mmol/L (ref 135–146)
Total Bilirubin: 0.5 mg/dL (ref 0.2–1.2)
Total Protein: 6.1 g/dL (ref 6.1–8.1)

## 2015-09-07 LAB — TSH: TSH: 3.618 u[IU]/mL (ref 0.350–4.500)

## 2015-09-10 ENCOUNTER — Ambulatory Visit
Admission: RE | Admit: 2015-09-10 | Discharge: 2015-09-10 | Disposition: A | Discharge status: Home / Self Care | Payer: Medicare Other | Source: Ambulatory Visit | Attending: Neurology | Admitting: Neurology

## 2015-09-10 DIAGNOSIS — G43719 Chronic migraine without aura, intractable, without status migrainosus: Secondary | ICD-10-CM

## 2015-09-10 MED ORDER — GADOBENATE DIMEGLUMINE 529 MG/ML IV SOLN: 8.0000 mL | Freq: Once | INTRAVENOUS | Status: DC | PRN

## 2015-09-11 ENCOUNTER — Encounter: Payer: Self-pay | Admitting: Cardiovascular Disease

## 2015-09-11 ENCOUNTER — Telehealth: Payer: Self-pay

## 2015-09-11 NOTE — Telephone Encounter (Signed)
-----   Message from Rosalin Hawking, MD sent at 09/10/2015  6:26 PM EDT ----- Hi, Brooke Williams:  Please let the pt know that her MRI is unremarkable. No pituitary mass. Will go over the images with her on the next appointment. Continue current treatment plan. No change. Thanks.   Rosalin Hawking, MD PhD Stroke Neurology 09/10/2015 6:26 PM

## 2015-09-11 NOTE — Telephone Encounter (Signed)
Rn call patient to left he know the MRi is unremarkable, with no pituitary mass. Told patient he will go over the images at next visit and continue treatment plan. Pt verbalized understanding, and will be at next appt in November 2016.

## 2015-09-23 ENCOUNTER — Other Ambulatory Visit (HOSPITAL_COMMUNITY): Payer: Self-pay | Admitting: Rheumatology

## 2015-09-23 DIAGNOSIS — D8989 Other specified disorders involving the immune mechanism, not elsewhere classified: Secondary | ICD-10-CM

## 2015-09-26 ENCOUNTER — Telehealth (HOSPITAL_COMMUNITY): Payer: Self-pay

## 2015-09-26 NOTE — Telephone Encounter (Signed)
Encounter complete. 

## 2015-10-01 ENCOUNTER — Ambulatory Visit (HOSPITAL_COMMUNITY)
Admission: RE | Admit: 2015-10-01 | Discharge: 2015-10-01 | Disposition: A | Discharge status: Home / Self Care | Payer: Medicare Other | Source: Ambulatory Visit | Attending: Internal Medicine | Admitting: Internal Medicine

## 2015-10-01 ENCOUNTER — Encounter (HOSPITAL_COMMUNITY): Payer: Medicare Other

## 2015-10-01 DIAGNOSIS — Z8249 Family history of ischemic heart disease and other diseases of the circulatory system: Secondary | ICD-10-CM

## 2015-10-01 DIAGNOSIS — R0789 Other chest pain: Secondary | ICD-10-CM

## 2015-10-01 LAB — EXERCISE TOLERANCE TEST
Estimated workload: 8.5 METS
Exercise duration (min): 7 min
MPHR: 153 {beats}/min
Peak HR: 160 {beats}/min
Percent HR: 104 %
RPE: 16
Rest HR: 88 {beats}/min

## 2015-10-03 ENCOUNTER — Encounter (HOSPITAL_COMMUNITY)
Admission: RE | Admit: 2015-10-03 | Discharge: 2015-10-03 | Disposition: A | Discharge status: Home / Self Care | Payer: Medicare Other | Source: Ambulatory Visit | Attending: Rheumatology | Admitting: Rheumatology

## 2015-10-03 DIAGNOSIS — M359 Systemic involvement of connective tissue, unspecified: Secondary | ICD-10-CM | POA: Diagnosis present

## 2015-10-03 DIAGNOSIS — D8989 Other specified disorders involving the immune mechanism, not elsewhere classified: Secondary | ICD-10-CM

## 2015-10-03 MED ORDER — TECHNETIUM TC 99M MEDRONATE IV KIT: 25.0000 | PACK | Freq: Once | INTRAVENOUS | Status: DC | PRN

## 2015-10-16 ENCOUNTER — Encounter: Payer: Self-pay | Admitting: Neurology

## 2015-10-16 ENCOUNTER — Ambulatory Visit (INDEPENDENT_AMBULATORY_CARE_PROVIDER_SITE_OTHER): Payer: Medicare Other | Admitting: Neurology

## 2015-10-16 VITALS — Ht 67.0 in | Wt 182.4 lb

## 2015-10-16 DIAGNOSIS — G44229 Chronic tension-type headache, not intractable: Secondary | ICD-10-CM | POA: Diagnosis not present

## 2015-10-16 DIAGNOSIS — G4733 Obstructive sleep apnea (adult) (pediatric): Secondary | ICD-10-CM

## 2015-10-16 NOTE — Progress Notes (Signed)
NEUROLOGY CLINIC FOLLOW UP PATIENT NOTE  NAME: Brooke Williams DOB: 22-Aug-1946  History summary: Brooke Williams is a 69 y.o. female with PMH of lupus, endometrial cancer s/p hysterectomy and chemotherapy, lipoma s/p surgery who presents as a new patient for Headache.  Patient stated that she was diagnosed with endometrial cancer and had robotic hysterectomy in January 2014. However, in February 2015, she was found to have recurrent focal soft tissue tumor at the site of hysterectomy, consider endometrial cancer recurrence, she received chemotherapy for about 6 months. 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 Had a some of the lipoma removed.   She has long-standing migraine headache for more than 30 years. The headache involves whole head, bandage like, 5-10/10 severity, associated with photophobia, no phonophobia or nausea vomiting. It happens several times a week, lasting 2 days, she has to lie down to help the headache. No identifiable triggers. She has tried more than 20 different medications, his symptoms only Robaxin and later gabapentin helped to take the edge off the pain. Since 2013, the headache getting worse both in severity and the frequency. However, for the last 4 months, the headaches changed their pattern, became frontal headache currently, pressure type headache, constant, never went away, associate with joint ice, nose irritation, fatigue all day long. Gabapentin does not work this time, also tried icing, rubbing, topical cream, nothing worked. Also has intermittent blurry vision, she is going to see an eye doctor tomorrow.   She has history of a hypothyroidism, on Synthroid supplement. She went to see endocrinologist, checked TSH and T4 within normal limits, and also ruled out other endocrine disorders. However she was advised to have MRI done to rule out pituitary tumor due to complaints of blurred vision.  She also  follows with rheumatology for her lupus, she has had lupus since 2008, has been treated with plaqenil. Currently off the medication last oncology visit in July 2016 showed normal C3, C4, RF, ESR, negative ANA and Cardiolipin antibodies.  She has sleep study for 5 years ago was told OSA, therefore not qualify for CPAP machine. However, she has constant insomnia, usually spend an hour before falling asleep, and able to sleep 2 hours longest.   She denies any smoking, alcohol drinking, or illicit drugs.  Interval history: During the interval time, pt stated that she has been doing the same. HA still there, sometimes pounding on the waking up in the morning. When she had her migraine in the past, she found only robaxin PRN was helping some. She has not used any robaxin yet. She had treadmill test and bone scan were both unremarkable. Her MRI with and without contrast showed no pituitary tumor. She still has difficulty with sleep and snores with bad quality of sleep.    Past Medical History  Diagnosis Date  . Lupus ([place])     in remission for 5-6 yrs, not on meds now.(more skin reactions,joint pain, sores in mouth)  . PONV (postoperative nausea and vomiting)   . Seasonal allergies   . GERD (gastroesophageal reflux disease)     controls with Zantac  . Headache(784.0)     hx. migraines  . Arthritis     arthritis-hands. cervical disc degeneration  . Endometrial cancer ([place]) 2014  . Lupus ([place])   . OSA (obstructive sleep apnea) 10/16/2015   Past Surgical History  Procedure Laterality Date  . Medial partial knee replacement      LT knee  .  Other surgical history      metal pin/screw to repair arthritis in LT thumb joint  . Hand surgery      left thumb surgery  . Dilation and curettage of uterus      2008-polyp removed  . Robotic assisted total hysterectomy with bilateral salpingo oopherectomy  12/20/2012    Procedure: ROBOTIC ASSISTED TOTAL HYSTERECTOMY WITH BILATERAL SALPINGO OOPHORECTOMY;   Surgeon: Imagene Gurney A. Alycia Rossetti, MD;  Location: [facility];  Service: Gynecology;  Laterality: N/A;  WITH PELVIC PARAAORTIC   . Robotic pelvic and para-aortic lymph node dissection  12/20/2012    Procedure: ROBOTIC PELVIC AND PARA-AORTIC LYMPH NODE DISSECTION;  Surgeon: Imagene Gurney A. Alycia Rossetti, MD;  Location: [facility];  Service: Gynecology;  Laterality: Bilateral;  . Colonoscopy    . Mass excision N/A 10/05/2014    Procedure: EXCISION OF BILATERAL LOWER BACK MASSES;  Surgeon: Coralie Keens, MD;  Location: [facility];  Service: General;  Laterality: N/A;   Family History  Problem Relation Age of Onset  . Stroke Mother 70    arterial wall of artery in eye   . Endometrial cancer Mother 44  . Dementia Father   . Stroke Father     "lots of many strokes"  . Aneurysm Father 10    AAA  . Multiple births Maternal Grandmother     died 5 days after delivery of unknown causes  . Colon cancer Maternal Aunt     late 25s  . Cancer Cousin     maternal cousin with an unknown form of cancer   Current Outpatient Prescriptions  Medication Sig Dispense Refill  . Alpha Lipoic Acid 200 MG CAPS Take by mouth.    . ASTRAGALUS PO Take by mouth.    . AZELASTINE HCL NA Place 205.5 mcg into the nose daily as needed.    Marland Kitchen BLACK COHOSH PO Take 800 mg by mouth daily.    Marland Kitchen CALCIUM PO Take 1,000 mg by mouth daily.     . Cholecalciferol (VITAMIN D-3 PO) Take 1 tablet by mouth daily. 2000 IU    . dexamethasone (DECADRON) 0.5 MG/5ML solution SWISH AND SPIT 5MLS (1 TEASPOONFUL) IN MOUTH 3 TIMES DAILY AS NEEDED  1  . fexofenadine-pseudoephedrine (ALLEGRA-D ALLERGY & CONGESTION) 60-120 MG 12 hr tablet Take 1 tablet by mouth 2 (two) times daily as needed.    . fluocinonide gel (LIDEX) 0.05 % APPLY TO AFFECTED AREA EVERY DAY AS NEEDED  1  . fluticasone (FLONASE) 50 MCG/ACT nasal spray Place 1 spray into both nostrils daily as needed for allergies.     Marland Kitchen ibuprofen (ADVIL,MOTRIN) 800 MG tablet Take 800 mg by mouth every 8  (eight) hours as needed for headache, mild pain or moderate pain.     Marland Kitchen ibuprofen (ADVIL,MOTRIN) 800 MG tablet Take by mouth.    Astrid Drafts Omega-3 500 MG CAPS Take 2 capsules by mouth daily.    . magic mouthwash SOLN   11  . Multiple Vitamin (MULTIVITAMIN WITH MINERALS) TABS Take 1 tablet by mouth daily.    . mupirocin ointment (BACTROBAN) 2 % APPLY TO THE INSIDE OF BOTH NOSTRILS USING QTIP TWICE DAILY  1  . nystatin (MYCOSTATIN) 100000 UNIT/ML suspension     . OVER THE COUNTER MEDICATION Take 600 mg by mouth daily. Alpha Lipoic Acid    . OVER THE COUNTER MEDICATION Take 400 mg by mouth daily. Astragalus Root    . psyllium (METAMUCIL) 0.52 G capsule Take 0.52 g by mouth  daily.    . ranitidine (ZANTAC) 150 MG tablet Take 150 mg by mouth daily as needed.     Marland Kitchen SYNTHROID 50 MCG tablet TAKE 1 TABLET ON EMPTY STOMACH BEFORE BREAKFAST ONCE A DAY FOR THYROID (DAW)  12  . SYNTHROID 75 MCG tablet     . triamcinolone cream (KENALOG) 0.1 % Apply 1 application topically daily as needed (rash).     . Psyllium (METAMUCIL PO) Take by mouth.     No current facility-administered medications for this visit.   Allergies  Allergen Reactions  . Cefdinir   . Codeine Itching    Facial flushing  . Penicillins Hives   Social History   Social History  . Marital Status: Married    Spouse Name: N/A  . Number of Children: 0  . Years of Education: N/A   Occupational History  . Not on file.   Social History Main Topics  . Smoking status: Never Smoker   . Smokeless tobacco: Not on file  . Alcohol Use: Yes     Comment: occassional  . Drug Use: No  . Sexual Activity: Not Currently    Birth Control/ Protection: Post-menopausal   Other Topics Concern  . Not on file   Social History Narrative   Patient does not have any biological children but has step children    Review of Systems Full 14 system review of systems performed and notable only for those listed, all others are neg:  Constitutional:    fatigue Cardiovascular:  Ear/Nose/Throat:   Ringing years Skin:  Eyes:   Light sensitivity Respiratory:    Gastroitestinal:   Genitourinary:  Hematology/Lymphatic:   Bruise easily Endocrine:  Heat intolerance Musculoskeletal:   muscle cramps Allergy/Immunology:    Neurological:   Headache Psychiatric:  Sleep: insomnia, snoring, frequent waking, restless leg   Physical Exam  There were no vitals filed for this visit.  General - obese, well developed, in no apparent distress.  Ophthalmologic - Sharp disc margins OU, no papilledema.  Cardiovascular - Regular rate and rhythm with no murmur. Carotid pulses were 2+ without bruits .   Neck - supple, no nuchal rigidity.  Mental Status -  Level of arousal and orientation to time, place, and person were intact. Language including expression, naming, repetition, comprehension, reading, and writing was assessed and found intact. Attention span and concentration were normal. Recent and remote memory were intact. Fund of Knowledge was assessed and was intact.  Cranial Nerves II - XII - II - Visual field intact OU. III, IV, VI - Extraocular movements intact. V - Facial sensation intact bilaterally. VII - Facial movement intact bilaterally. VIII - Hearing & vestibular intact bilaterally. X - Palate elevates symmetrically. XI - Chin turning & shoulder shrug intact bilaterally. XII - Tongue protrusion intact.  Motor Strength - The patient's strength was normal in all extremities and pronator drift was absent.  Bulk was normal and fasciculations were absent.   Motor Tone - Muscle tone was assessed at the neck and appendages and was normal.  Reflexes - The patient's reflexes were normal in all extremities and she had no pathological reflexes.  Sensory - Light touch, temperature/pinprick, vibration and proprioception, and Romberg testing were assessed and were normal.    Coordination - The patient had normal movements in the hands and  feet with no ataxia or dysmetria.  Tremor was absent.  Gait and Station - The patient's transfers, posture, gait, station, and turns were observed as normal.   Imaging  I have personally reviewed the radiological images below and agree with the radiology interpretations.  MRI head without contrast 10/2012 Bilateral supratentorial subcentimeter foci of white matter signal abnormality; see discussion above. No acute intracranial abnormality.  MRI with and without contrast 09/10/15 1. Scattered periventricular and subcortical foci of T2 hyperintensities. No abnormal lesions are seen on post contrast views. These findings are non-specific and considerations include autoimmune, inflammatory, post-infectious, microvascular ischemic or migraine associated etiologies. 2. Normal pituitary gland. No acute findings. 3. Compared to MRI on 11/15/12, there has been slight increase in white matter disease.  Lab Review 06/17/15 Creatinine 0.79, AST 25, ALT 31, WBC 5.6, hemoglobin 14, platelet 241, ESR 17, TSH 3.246, C3 143, C4 33, RF less than 10, ANA negative, serum protein electrophoresis within normal limits, cardiolipin antibodies negative, anti-double strand DNA negative, SSA, SSB, anti-Smith antibody, SCL 70 all negative.  08/16/2015 TSH 2.73, free T4 1.54   Component     Latest Ref Rng 09/06/2015  Cholesterol     125 - 200 mg/dL 216 (H)  Triglycerides     <150 mg/dL 160 (H)  HDL Cholesterol     >=46 mg/dL 58  Total CHOL/HDL Ratio     <=5.0 Ratio 3.7  VLDL     <30 mg/dL 32 (H)  LDL (calc)     <130 mg/dL 126  TSH     0.350 - 4.500 uIU/mL 3.618     Assessment:   In summary, Brooke Williams is a 69 y.o. female with PMH of lupus off plaquenil, endometrial cancer s/p hysterectomy and chemotherapy, lipoma s/p surgery, hypothyroidism, borderline OSA, and long-standing migraine headache presents constant frontal mild headache for the last 4 months without relief which is different from  her previous migraine headache. She went to see endocrinologist for hypothyroidism, TSH and free T4 normal, ruled out other endocrine disorders, however recommend to have MRI done to rule out pituitary tumor due to complains of blurry vision. On exam, there is no visual field deficit, and fundi exam was normal. Headache most consistent with tension headache due to anxiety. MRI with and without contrast ruled out structural lesions. Will do sleep study to rule out OSA.  Plan: - will do sleep study to rule out OSA - decrease anxiety and stress level, improve sleep as much as you can - OK with robaxin PRN use for HA - continue follow up with your other physicians as scheduled - follow up in 3 months.  I spent more than 25 minutes of face to face time with the patient. Greater than 50% of time was spent in counseling and coordination of care. We have discussed about OSA evaluation and cope with stress.  Orders Placed This Encounter  Procedures  . Ambulatory referral to Sleep Studies    Referral Priority:  Routine    Referral Type:  Consultation    Referral Reason:  Specialty Services Required    Number of Visits Requested:  1    Meds ordered this encounter  Medications  . ibuprofen (ADVIL,MOTRIN) 800 MG tablet    Sig: Take by mouth.  Ilean Skill Lipoic Acid 200 MG CAPS    Sig: Take by mouth.  . SYNTHROID 75 MCG tablet    Sig:   . dexamethasone (DECADRON) 0.5 MG/5ML solution    Sig: SWISH AND SPIT 5MLS (1 TEASPOONFUL) IN MOUTH 3 TIMES DAILY AS NEEDED    Refill:  1  . Psyllium (METAMUCIL PO)    Sig: Take by mouth.  Alice Reichert  PO    Sig: Take by mouth.    Patient Instructions  - will do sleep study to rule out OSA - decrease anxiety and stress level, improve sleep as much as you can - OK with robaxin PRN use for HA - continue follow up with your other physicians as scheduled - follow up in 3 months.    Rosalin Hawking, MD PhD [facility] [address] [place], [place] [address] [phone]

## 2015-10-16 NOTE — Patient Instructions (Signed)
-   will do sleep study to rule out OSA - decrease anxiety and stress level, improve sleep as much as you can - OK with robaxin PRN use for HA - continue follow up with your other physicians as scheduled - follow up in 3 months.

## 2015-10-28 ENCOUNTER — Ambulatory Visit (INDEPENDENT_AMBULATORY_CARE_PROVIDER_SITE_OTHER): Payer: Medicare Other | Admitting: Neurology

## 2015-10-28 ENCOUNTER — Encounter: Payer: Self-pay | Admitting: Neurology

## 2015-10-28 VITALS — BP 118/80 | HR 80 | Resp 20 | Ht 67.0 in | Wt 179.0 lb

## 2015-10-28 DIAGNOSIS — R0683 Snoring: Secondary | ICD-10-CM

## 2015-10-28 DIAGNOSIS — G4710 Hypersomnia, unspecified: Secondary | ICD-10-CM | POA: Diagnosis not present

## 2015-10-28 DIAGNOSIS — R51 Headache: Secondary | ICD-10-CM | POA: Diagnosis not present

## 2015-10-28 DIAGNOSIS — G4730 Sleep apnea, unspecified: Secondary | ICD-10-CM | POA: Diagnosis not present

## 2015-10-28 DIAGNOSIS — G2581 Restless legs syndrome: Secondary | ICD-10-CM | POA: Diagnosis not present

## 2015-10-28 DIAGNOSIS — G478 Other sleep disorders: Secondary | ICD-10-CM

## 2015-10-28 DIAGNOSIS — G4701 Insomnia due to medical condition: Secondary | ICD-10-CM

## 2015-10-28 DIAGNOSIS — R519 Headache, unspecified: Secondary | ICD-10-CM

## 2015-10-28 MED ORDER — PRAMIPEXOLE DIHYDROCHLORIDE 0.25 MG PO TABS: 0.2500 mg | ORAL_TABLET | Freq: Every evening | ORAL | Status: DC

## 2015-10-28 NOTE — Progress Notes (Signed)
NEUROLOGY CLINIC FOLLOW UP PATIENT NOTE  NAME: Brooke Williams DOB: June 14, 1946  History summary:  Brooke Williams is a 69 y.o. female with PMH of lupus, endometrial cancer s/p hysterectomy and chemotherapy, lipoma s/p surgery who presents as a new patient for Sleep apnea,  [name] is a new patient to our sleep clinic, she moved from [place] 15 years ago to [place]. She underwent a sleep study at the [facility] hardened sleep center where she was followed by Dr. Ellouise Newer her cardiologist. The study is dated 08-05-11 and was ordered upon the patient's report of excessive daytime sleepiness, waking up with headaches in the morning and feeling that her sleep was not restorative or refreshing. She had endorsed the Becks depression inventory at 16 points at that time and the Epworth scale at 12 points. She also had been told that she was a loud snorer and sometimes had difficulties falling asleep while still feeling very sleepy. The sleep problem had already persisted for longer than 2 years at the time that she was tested. Her sleep study had revealed an AHI of only 4.3 and during REM sleep at 13.1. The RDI which includes arousals from snoring and not just apneas, was 17.9 and during REM sleep increased to 39. The patient had extremely mild sleep apnea but a moderate to severe degree of upper airway resistance. She also support during her sleep study. She had significant periodic limb movements and Dr. Claiborne Billings reported 28.5 per hour were causing arousals which would be a more severe sleep disorder in her apnea was.  In the meantime the patient states that her restless legs which were present 4 years ago have progressed to a more severe degree. She goes to bed between 10 and 11 PM and usually falls asleep within 1 hour, some nights it may take 2 hours. As soon as she retreats to bed and watches television for example she gets the symptoms of restless legs. She wakes up routinely  about every 90 minutes during the night and sometimes she will go to the bathroom but it was not the bathroom call that woke her. She will rise in the morning at about 7 AM spontaneously. She remains and that for another 1-1/2 hours or so before she has the energy to get up. She still feels her sleep is not restorative or refreshing. She wakes frequently up with a headache, her headaches are there all the time but she reports the most severe part of the day in the morning. The headaches have not woken her but she wakes up with the headaches and is aware of them. He will also often go to bed with some headaches. The headaches are described as a pressure to the for head. There is no retro-orbital pressure and it does not temporal. It is does not prefer one side versus the other. She sometimes gets nauseated with her headaches lasting all day. There is a dizziness with headaches, feeling swimmy headed.  She sleeps on her left, avoids supine sleep. The bedroom is cool, quiet and dark.   Dr Phoebe Sharps note from October : Patient stated that she was diagnosed with endometrial cancer and had robotic hysterectomy in January 2014. However, in February 2015, she was found to have recurrent focal soft tissue tumor at the site of hysterectomy, consider endometrial cancer recurrence, she received chemotherapy for about 6 months. 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 Had a some of the  lipoma removed.   She has long-standing migraine headache for more than 30 years. The headache involves whole head, bandage like, 5-10/10 severity, associated with photophobia, no phonophobia or nausea vomiting. It happens several times a week, lasting 2 days, she has to lie down to help the headache. No identifiable triggers. She has tried more than 20 different medications, his symptoms only Robaxin and later gabapentin helped to take the edge off the pain. Since 2013, the headache getting  worse both in severity and the frequency. However, for the last 4 months, the headaches changed their pattern, became frontal headache currently, pressure type headache, constant, never went away, associate with joint ice, nose irritation, fatigue all day long. Gabapentin does not work this time, also tried icing, rubbing, topical cream, nothing worked. Also has intermittent blurry vision, she is going to see an eye doctor tomorrow.   She has history of a hypothyroidism, on Synthroid supplement. She went to see endocrinologist, checked TSH and T4 within normal limits, and also ruled out other endocrine disorders. However she was advised to have MRI done to rule out pituitary tumor due to complaints of blurred vision. She also follows with rheumatology for her lupus, she has had lupus since 2008, has been treated with plaqenil. Currently off the medication last oncology visit in July 2016 showed normal C3, C4, RF, ESR, negative ANA and Cardiolipin antibodies.  She has sleep study over 4  years ago was told that the treatment would require CPAP< OSA, but she did not  qualify for CPAP machine. However, she has constant insomnia, usually spend an hour before falling asleep, and able to sleep 2 hours longest.   She denies any smoking, alcohol drinking, or illicit drugs.  Interval history: During the interval time, pt stated that she has been doing the same. HA still there, sometimes pounding on the waking up in the morning. When she had her migraine in the past, she found only robaxin PRN was helping some. She has not used any robaxin yet. She had treadmill test and bone scan were both unremarkable. Her MRI with and without contrast showed no pituitary tumor. She still has difficulty with sleep and snores with bad quality of sleep.  Epworth and Depression score, fatigue score were quoted above.    Past Medical History  Diagnosis Date  . Lupus ([place])     in remission for 5-6 yrs, not on meds now.(more skin  reactions,joint pain, sores in mouth)  . PONV (postoperative nausea and vomiting)   . Seasonal allergies   . GERD (gastroesophageal reflux disease)     controls with Zantac  . Headache(784.0)     hx. migraines  . Arthritis     arthritis-hands. cervical disc degeneration  . Endometrial cancer ([place]) 2014  . Lupus ([place])   . OSA (obstructive sleep apnea) 10/16/2015   Past Surgical History  Procedure Laterality Date  . Medial partial knee replacement      LT knee  . Other surgical history      metal pin/screw to repair arthritis in LT thumb joint  . Hand surgery      left thumb surgery  . Dilation and curettage of uterus      2008-polyp removed  . Robotic assisted total hysterectomy with bilateral salpingo oopherectomy  12/20/2012    Procedure: ROBOTIC ASSISTED TOTAL HYSTERECTOMY WITH BILATERAL SALPINGO OOPHORECTOMY;  Surgeon: Imagene Gurney A. Alycia Rossetti, MD;  Location: [facility];  Service: Gynecology;  Laterality: N/A;  WITH PELVIC PARAAORTIC   .  Robotic pelvic and para-aortic lymph node dissection  12/20/2012    Procedure: ROBOTIC PELVIC AND PARA-AORTIC LYMPH NODE DISSECTION;  Surgeon: Imagene Gurney A. Alycia Rossetti, MD;  Location: [facility];  Service: Gynecology;  Laterality: Bilateral;  . Colonoscopy    . Mass excision N/A 10/05/2014    Procedure: EXCISION OF BILATERAL LOWER BACK MASSES;  Surgeon: Coralie Keens, MD;  Location: [place];  Service: General;  Laterality: N/A;   Family History  Problem Relation Age of Onset  . Stroke Mother 43    arterial wall of artery in eye   . Endometrial cancer Mother 57  . Dementia Father   . Stroke Father     "lots of many strokes"  . Aneurysm Father 69    AAA  . Multiple births Maternal Grandmother     died 5 days after delivery of unknown causes  . Colon cancer Maternal Aunt     late 28s  . Cancer Cousin     maternal cousin with an unknown form of cancer   Current Outpatient Prescriptions  Medication Sig Dispense Refill  . Alpha Lipoic Acid 200  MG CAPS Take by mouth.    . ASTRAGALUS PO Take by mouth.    . AZELASTINE HCL NA Place 205.5 mcg into the nose daily as needed.    Marland Kitchen BLACK COHOSH PO Take 800 mg by mouth daily.    Marland Kitchen CALCIUM PO Take 1,000 mg by mouth daily.     . Cholecalciferol (VITAMIN D-3 PO) Take 1 tablet by mouth daily. 2000 IU    . fexofenadine-pseudoephedrine (ALLEGRA-D ALLERGY & CONGESTION) 60-120 MG 12 hr tablet Take 1 tablet by mouth 2 (two) times daily as needed.    . fluocinonide gel (LIDEX) 0.05 % APPLY TO AFFECTED AREA EVERY DAY AS NEEDED  1  . fluticasone (FLONASE) 50 MCG/ACT nasal spray Place 1 spray into both nostrils daily as needed for allergies.     Marland Kitchen ibuprofen (ADVIL,MOTRIN) 800 MG tablet Take 800 mg by mouth every 8 (eight) hours as needed for headache, mild pain or moderate pain.     Marland Kitchen ibuprofen (ADVIL,MOTRIN) 800 MG tablet Take by mouth.    Astrid Drafts Omega-3 500 MG CAPS Take 2 capsules by mouth daily.    . magic mouthwash SOLN   11  . Multiple Vitamin (MULTIVITAMIN WITH MINERALS) TABS Take 1 tablet by mouth daily.    . mupirocin ointment (BACTROBAN) 2 % APPLY TO THE INSIDE OF BOTH NOSTRILS USING QTIP TWICE DAILY  1  . nystatin (MYCOSTATIN) 100000 UNIT/ML suspension     . OVER THE COUNTER MEDICATION Take 600 mg by mouth daily. Alpha Lipoic Acid    . OVER THE COUNTER MEDICATION Take 400 mg by mouth daily. Astragalus Root    . Psyllium (METAMUCIL PO) Take by mouth.    . psyllium (METAMUCIL) 0.52 G capsule Take 0.52 g by mouth daily.    . ranitidine (ZANTAC) 150 MG tablet Take 150 mg by mouth daily as needed.     Marland Kitchen SYNTHROID 75 MCG tablet     . triamcinolone cream (KENALOG) 0.1 % Apply 1 application topically daily as needed (rash).      No current facility-administered medications for this visit.   Allergies  Allergen Reactions  . Cefdinir   . Codeine Itching    Facial flushing  . Penicillins Hives   Social History   Social History  . Marital Status: Married    Spouse Name: N/A  . Number of  Children: 0  . Years of Education: N/A   Occupational History  . Not on file.   Social History Main Topics  . Smoking status: Never Smoker   . Smokeless tobacco: Not on file  . Alcohol Use: Yes     Comment: occassional  . Drug Use: No  . Sexual Activity: Not Currently    Birth Control/ Protection: Post-menopausal   Other Topics Concern  . Not on file   Social History Narrative   Patient does not have any biological children but has step children    Review of Systems Full 14 system review of systems performed and notable only for those listed, all others are neg:  Constitutional:   Fatigue, EDS , morning headaches.   Physical Exam  Filed Vitals:   10/28/15 1001  BP: 118/80  Pulse: 80  Resp: 20    General - obese, well developed, in no apparent distress.   Cardiovascular - Regular rate and rhythm with no murmur. Carotid pulses were 2+ without bruits .   Neck - supple, no nuchal rigidity.  Neck circumference is 15-1/2 inches, Mallampati is grade 4 with a narrow and peaked palate. There is no redness or swelling of the upper airway noted. The patient has a slight retrognathia and a small lower jaw.    Mental Status -  Level of arousal and orientation to time, place, and person were intact. Language including expression, naming, repetition, comprehension, reading, and writing was assessed and found intact. Attention span and concentration were normal. Recent and remote memory were intact. Fund of Knowledge was assessed and was intact.  Cranial Nerves II - XII - The patient reports a change in her taste sensitivity ever since undergoing chemotherapy. A metallic aftertaste. Chemotherapy concluded a 16 month ago. Her ability to smell is unchanged. Visual field intact OU.Extraocular movements intact. Facial sensation intact bilaterally.Facial movement intact bilaterally. Hearing & vestibular intact bilaterally. No lateralization by Talbert Nan .   Palate elevates  symmetrically. Tongue midline. Chin turning & shoulder shrug intact bilaterally.  Motor Strength - The patient's strength was normal in all extremities and pronator drift was absent.  Bulk was normal and fasciculations were absent.    Muscle tone was normal.  Reflexes - The patient's reflexes were normal in all extremities .  Sensory - Light touch, temperature/pinprick, vibration and proprioception, and Romberg testing were assessed and were normal.     Imaging  I have personally reviewed the radiological images below and agree with the radiology interpretations.  MRI head without contrast 10/2012 Bilateral supratentorial subcentimeter foci of white matter signal abnormality; see discussion above. No acute intracranial abnormality.  MRI with and without contrast 09/10/15 1. Scattered periventricular and subcortical foci of T2 hyperintensities. No abnormal lesions are seen on post contrast views. These findings are non-specific and considerations include autoimmune, inflammatory, post-infectious, microvascular ischemic or migraine associated etiologies. 2. Normal pituitary gland. No acute findings. 3. Compared to MRI on 11/15/12, there has been slight increase in white matter disease.  Lab Review 06/17/15 Creatinine 0.79, AST 25, ALT 31, WBC 5.6, hemoglobin 14, platelet 241, ESR 17, TSH 3.246, C3 143, C4 33, RF less than 10, ANA negative, serum protein electrophoresis within normal limits, cardiolipin antibodies negative, anti-double strand DNA negative, SSA, SSB, anti-Smith antibody, SCL 70 all negative.  08/16/2015, TSH 2.73, free T4 1.54   Component     Latest Ref Rng 09/06/2015  Cholesterol     125 - 200 mg/dL 216 (H)  Triglycerides     <  150 mg/dL 160 (H)  HDL Cholesterol     >=46 mg/dL 58  Total CHOL/HDL Ratio     <=5.0 Ratio 3.7  VLDL     <30 mg/dL 32 (H)  LDL (calc)     <130 mg/dL 126  TSH     0.350 - 4.500 uIU/mL 3.618   I was able to review the patient's  original sleep study from 08-05-11 at the [facility] hardened sleep center , as interpreted by Dr. Shelva Majestic.  The patient had such mild apnea that a CPAP titration was not justified. She does have retrognathia and she reports been witnessed to snore. I think that her morning headaches may be related to hypoxemia at night also this was not found 4 years ago. She does have significant periodic limb movements and these correlates with her report of restless legs. It is the restless legs that delay her sleep onset. I would like for the patient to be treated for restless legs she has never taken restless leg medications also these were recommended after her sleep study. I would certainly consider to order a sleep aid should the restless leg medication not allow her to go to sleep easier and maintain sleep better. In addition I would like to obtain a new sleep study but able to the sleep study after I have written for medication and treatment for restless legs.   Assessment:   In summary, Tahjanae Blankenburg is a 69 y.o. female with PMH of lupus off plaquenil, endometrial cancer s/p hysterectomy and chemotherapy, lipoma s/p surgery, hypothyroidism, borderline OSA, and long-standing migraine headache presents constant frontal mild headache for the last 4 months without relief which is different from her previous migraine headache. She went to see endocrinologist for hypothyroidism, TSH and free T4 normal, ruled out other endocrine disorders, however recommend to have MRI done to rule out pituitary tumor due to complains of blurry vision. On exam, there is no visual field deficit, and fundi exam was normal. Headache most consistent with tension headache due to anxiety. MRI with and without contrast ruled out structural lesions.  I was able to review the patient's original sleep study from 08-05-11 at the [facility] hardened sleep center , as interpreted by Dr. Shelva Majestic.  The patient had such mild apnea that a CPAP  titration was not justified. She does have retrognathia and she reports been witnessed to snore. I think that her morning headaches may be related to hypoxemia at night also this was not found 4 years ago. She does have significant periodic limb movements and these correlates with her report of restless legs. It is the restless legs that delay her sleep onset. I would like for the patient to be treated for restless legs she has never taken restless leg medications also these were recommended after her sleep study. I would certainly consider to order a sleep aid should the restless leg medication not allow her to go to sleep easier and maintain sleep better. In addition I would like to obtain a new sleep study but able to the sleep study after I have written for medication and treatment for restless legs. The patient reports that she had terrible iron deficiency and anemia even as a teenager. With a history of endometrial cancer and chemotherapy I can imagine that her iron deficiency has been exacerbated. And this would correlate with a worsening of her restless leg symptoms as she describes a timeline.    Plan: - will do sleep study to rule out  OSA after treating the RLS , PLM disorder.  - decrease anxiety and stress level, improve sleep as much as you can - - continue follow up with your other physicians as scheduled - follow up after sleep study with me,  Larey Seat, MD  I spent more than 45 minutes of face to face time with the patient. Greater than 50% of time was spent in counseling and coordination of care. We have discussed about OSA evaluation and RLS. The possible comorbidities of iron deficiency, insomnia, snoring as a symptom of upper airway resistance the but not always correlated with obstructive sleep apnea. [place], [facility] [address] [place], [place] [address] [phone]

## 2015-10-28 NOTE — Patient Instructions (Signed)
Pramipexole tablets What is this medicine? PRAMIPEXOLE (pra mi PEX ole) is used to treat symptoms of Parkinson's disease. It is also used to treat Restless Legs Syndrome. This medicine may be used for other purposes; ask your health care provider or pharmacist if you have questions. What should I tell my health care provider before I take this medicine? They need to know if you have any of these conditions: -dizzy or fainting spells -heart disease -kidney disease -low blood pressure -schizophrenia -sleeping problems -an unusual or allergic reaction to pramipexole, other medicines, foods, dyes, or preservatives -pregnant or trying to get pregnant -breast-feeding How should I use this medicine? Take this medicine by mouth with a glass of water. Follow the directions on the prescription label. Take with food. Take your doses at regular intervals. Do not take your medicine more often than directed. Do not stop taking except on the advice of your doctor or health care professional. Talk to your pediatrician regarding the use of this medicine in children. Special care may be needed. Overdosage: If you think you have taken too much of this medicine contact a poison control center or emergency room at once. NOTE: This medicine is only for you. Do not share this medicine with others. What if I miss a dose? If you miss a dose, take it as soon as you can. If it is almost time for your next dose, take only that dose. Do not take double or extra doses. What may interact with this medicine? -amantadine -cimetidine -diltiazem -medicines for mental problems or psychotic disturbances -medicines for sleep -metoclopramide -quinidine or quinine -ranitidine -triamterene -verapamil This list may not describe all possible interactions. Give your health care provider a list of all the medicines, herbs, non-prescription drugs, or dietary supplements you use. Also tell them if you smoke, drink alcohol, or use  illegal drugs. Some items may interact with your medicine. What should I watch for while using this medicine? Visit your doctor or health care professional for regular checks on your progress. It may be several weeks or months before you feel the full effect of this medicine. Continue to take your medicine on a regular schedule. You may get drowsy or dizzy. Do not drive, use machinery, or do anything that needs mental alertness until you know how this drug affects you. Do not stand or sit up quickly, especially if you are an older patient. This reduces the risk of dizzy or fainting spells. If you find that you have sudden feelings of wanting to sleep during normal activities, like cooking, watching television, or while driving or riding in a car, you should contact your health care professional. Your mouth may get dry. Chewing sugarless gum or sucking hard candy, and drinking plenty of water may help. Contact your doctor if the problem does not go away or is severe. There have been reports of increased sexual urges or other strong urges such as gambling while taking some medicines for Parkinson's disease. If you experience any of these urges while taking this medicine, you should report it to your health care provider as soon as possible. You should check your skin often for changes to moles and new growths while taking this medicine. Call your doctor if you notice any of these changes. What side effects may I notice from receiving this medicine? Side effects that you should report to your doctor or health care professional as soon as possible: -confusion -double vision or other vision problems -fainting spells -falling asleep during normal  activities like driving -hallucination, loss of contact with reality -mental changes -muscle pain or severe muscle weakness -uncontrollable movements of the arms, face, hands, head, mouth, shoulders, or upper body Side effects that usually do not require medical  attention (report to your doctor or health care professional if they continue or are bothersome): -constipation -frequent urination -mild weakness -nausea This list may not describe all possible side effects. Call your doctor for medical advice about side effects. You may report side effects to FDA at 1-800-FDA-1088. Where should I keep my medicine? Keep out of the reach of children. Store at room temperature between 15 and 30 degrees C (59 and 86 degrees F). Protect from light. Throw away any unused medicine after the expiration date. NOTE: This sheet is a summary. It may not cover all possible information. If you have questions about this medicine, talk to your doctor, pharmacist, or health care provider.    2016, [employer]. (2014-04-02 15:53:08)

## 2015-10-31 DIAGNOSIS — K567 Ileus, unspecified: Secondary | ICD-10-CM

## 2015-10-31 HISTORY — DX: Ileus, unspecified: K56.7

## 2015-11-01 ENCOUNTER — Ambulatory Visit (INDEPENDENT_AMBULATORY_CARE_PROVIDER_SITE_OTHER): Payer: Medicare Other | Admitting: Neurology

## 2015-11-01 DIAGNOSIS — G4730 Sleep apnea, unspecified: Secondary | ICD-10-CM

## 2015-11-01 DIAGNOSIS — G4710 Hypersomnia, unspecified: Secondary | ICD-10-CM | POA: Diagnosis not present

## 2015-11-01 DIAGNOSIS — R519 Headache, unspecified: Secondary | ICD-10-CM

## 2015-11-01 DIAGNOSIS — R0683 Snoring: Secondary | ICD-10-CM

## 2015-11-01 DIAGNOSIS — G4701 Insomnia due to medical condition: Secondary | ICD-10-CM

## 2015-11-01 DIAGNOSIS — G2581 Restless legs syndrome: Secondary | ICD-10-CM

## 2015-11-01 DIAGNOSIS — R51 Headache: Secondary | ICD-10-CM

## 2015-11-01 DIAGNOSIS — G478 Other sleep disorders: Secondary | ICD-10-CM

## 2015-11-02 NOTE — Sleep Study (Signed)
Please see the scanned sleep study interpretation located in the procedure tab within the chart review section.   

## 2015-11-03 ENCOUNTER — Emergency Department (HOSPITAL_COMMUNITY)
Admission: EM | Admit: 2015-11-03 | Discharge: 2015-11-03 | Disposition: A | Discharge status: Home / Self Care | Payer: Medicare Other | Attending: Emergency Medicine | Admitting: Emergency Medicine

## 2015-11-03 ENCOUNTER — Encounter (HOSPITAL_COMMUNITY): Payer: Self-pay

## 2015-11-03 ENCOUNTER — Emergency Department (HOSPITAL_COMMUNITY): Payer: Medicare Other

## 2015-11-03 DIAGNOSIS — K219 Gastro-esophageal reflux disease without esophagitis: Secondary | ICD-10-CM | POA: Diagnosis not present

## 2015-11-03 DIAGNOSIS — Z8542 Personal history of malignant neoplasm of other parts of uterus: Secondary | ICD-10-CM | POA: Diagnosis not present

## 2015-11-03 DIAGNOSIS — G2581 Restless legs syndrome: Secondary | ICD-10-CM | POA: Diagnosis not present

## 2015-11-03 DIAGNOSIS — K567 Ileus, unspecified: Secondary | ICD-10-CM | POA: Insufficient documentation

## 2015-11-03 DIAGNOSIS — M158 Other polyosteoarthritis: Secondary | ICD-10-CM | POA: Diagnosis not present

## 2015-11-03 DIAGNOSIS — Z79899 Other long term (current) drug therapy: Secondary | ICD-10-CM | POA: Insufficient documentation

## 2015-11-03 DIAGNOSIS — Z90710 Acquired absence of both cervix and uterus: Secondary | ICD-10-CM | POA: Diagnosis not present

## 2015-11-03 DIAGNOSIS — K6289 Other specified diseases of anus and rectum: Secondary | ICD-10-CM | POA: Diagnosis not present

## 2015-11-03 DIAGNOSIS — Z880 Allergy status to penicillin: Secondary | ICD-10-CM | POA: Diagnosis not present

## 2015-11-03 DIAGNOSIS — R109 Unspecified abdominal pain: Secondary | ICD-10-CM | POA: Diagnosis present

## 2015-11-03 HISTORY — DX: Restless legs syndrome: G25.81

## 2015-11-03 LAB — URINALYSIS, ROUTINE W REFLEX MICROSCOPIC
Bilirubin Urine: NEGATIVE
Glucose, UA: NEGATIVE mg/dL
Ketones, ur: NEGATIVE mg/dL
Nitrite: NEGATIVE
Protein, ur: NEGATIVE mg/dL
Specific Gravity, Urine: 1.008 (ref 1.005–1.030)
pH: 8 (ref 5.0–8.0)

## 2015-11-03 LAB — CBC
HCT: 44 % (ref 36.0–46.0)
Hemoglobin: 15.1 g/dL — ABNORMAL HIGH (ref 12.0–15.0)
MCH: 32.7 pg (ref 26.0–34.0)
MCHC: 34.3 g/dL (ref 30.0–36.0)
MCV: 95.2 fL (ref 78.0–100.0)
Platelets: 284 10*3/uL (ref 150–400)
RBC: 4.62 MIL/uL (ref 3.87–5.11)
RDW: 13.2 % (ref 11.5–15.5)
WBC: 9.3 10*3/uL (ref 4.0–10.5)

## 2015-11-03 LAB — COMPREHENSIVE METABOLIC PANEL
ALT: 39 U/L (ref 14–54)
AST: 34 U/L (ref 15–41)
Albumin: 4.6 g/dL (ref 3.5–5.0)
Alkaline Phosphatase: 96 U/L (ref 38–126)
Anion gap: 13 (ref 5–15)
BUN: 13 mg/dL (ref 6–20)
CO2: 27 mmol/L (ref 22–32)
Calcium: 9.9 mg/dL (ref 8.9–10.3)
Chloride: 101 mmol/L (ref 101–111)
Creatinine, Ser: 0.84 mg/dL (ref 0.44–1.00)
GFR calc Af Amer: >60 mL/min (ref >60)
GFR calc non Af Amer: >60 mL/min (ref >60)
Glucose, Bld: 115 mg/dL — ABNORMAL HIGH (ref 65–99)
Potassium: 4.1 mmol/L (ref 3.5–5.1)
Sodium: 141 mmol/L (ref 135–145)
Total Bilirubin: 0.8 mg/dL (ref 0.3–1.2)
Total Protein: 7.4 g/dL (ref 6.5–8.1)

## 2015-11-03 LAB — URINE MICROSCOPIC-ADD ON: Bacteria, UA: NONE SEEN

## 2015-11-03 LAB — LIPASE, BLOOD: Lipase: 28 U/L (ref 11–51)

## 2015-11-03 LAB — POC OCCULT BLOOD, ED: Fecal Occult Bld: POSITIVE — AB

## 2015-11-03 MED ORDER — CIPROFLOXACIN HCL 500 MG PO TABS: 500.0000 mg | ORAL_TABLET | Freq: Two times a day (BID) | ORAL | Status: DC

## 2015-11-03 MED ORDER — POLYETHYLENE GLYCOL 3350 17 G PO PACK: 17.0000 g | PACK | Freq: Every day | ORAL | Status: DC

## 2015-11-03 MED ORDER — METRONIDAZOLE 500 MG PO TABS
500.0000 mg | ORAL_TABLET | Freq: Once | ORAL | Status: AC
  Administered 2015-11-03: 500 mg via ORAL
  Filled 2015-11-03: qty 1

## 2015-11-03 MED ORDER — CIPROFLOXACIN HCL 500 MG PO TABS
500.0000 mg | ORAL_TABLET | Freq: Once | ORAL | Status: AC
Start: 2015-11-03 — End: 2015-11-03
  Administered 2015-11-03: 500 mg via ORAL
  Filled 2015-11-03: qty 1

## 2015-11-03 MED ORDER — SODIUM CHLORIDE 0.9 % IV BOLUS (SEPSIS)
500.0000 mL | Freq: Once | INTRAVENOUS | Status: AC
  Administered 2015-11-03: 500 mL via INTRAVENOUS

## 2015-11-03 MED ORDER — METRONIDAZOLE 500 MG PO TABS: 500.0000 mg | ORAL_TABLET | Freq: Two times a day (BID) | ORAL | Status: DC

## 2015-11-03 MED ORDER — IOHEXOL 300 MG/ML  SOLN
100.0000 mL | Freq: Once | INTRAMUSCULAR | Status: AC | PRN
  Administered 2015-11-03: 100 mL via INTRAVENOUS

## 2015-11-03 NOTE — ED Notes (Signed)
Pt c/o upper abdominal pain, emesis, constipation, and GI bleeding x 3 days.  Pain score 7/10.  Sts she has been straining to have a BM and blood is bright red.  Pt reports taking laxative and Metamucil w/o relief.

## 2015-11-03 NOTE — ED Provider Notes (Signed)
CSN: [id number]     Arrival date & time 11/03/15  1439 History   First MD Initiated Contact with Patient 11/03/15 1656     Chief Complaint  Patient presents with  . Abdominal Pain  . Emesis  . GI Bleeding     (Consider location/radiation/quality/duration/timing/severity/associated sxs/prior Treatment) HPI Comments: 69 y.o. Female with history of hemorrhoids, SLE, endometrial CA presents for constipation and blood per rectum.  The patient reports that she has not had a bowel movement in 4 days although she feels like she needs to.  She says that she has been trying to have a bowel movement and all she notes is that there has been small amounts of bright red blood on her toilet paper.  Denies fever or chills.  She says that she has had some associated abdominal discomfort but feels it is more cramping.  She reports that she can still eat and drink but has felt nauseous and has felt like vomiting.  She also notes that her abdomen has felt more distended over the last few days.    Patient is a 69 y.o. female presenting with abdominal pain and vomiting.  Abdominal Pain Associated symptoms: constipation, nausea and vomiting   Associated symptoms: no chest pain, no chills, no cough, no diarrhea, no dysuria, no fatigue, no fever and no shortness of breath   Emesis Associated symptoms: abdominal pain   Associated symptoms: no chills, no diarrhea, no headaches and no myalgias     Past Medical History  Diagnosis Date  . Lupus ([place])     in remission for 5-6 yrs, not on meds now.(more skin reactions,joint pain, sores in mouth)  . PONV (postoperative nausea and vomiting)   . Seasonal allergies   . GERD (gastroesophageal reflux disease)     controls with Zantac  . Headache(784.0)     hx. migraines  . Arthritis     arthritis-hands. cervical disc degeneration  . Endometrial cancer ([place]) 2014  . Lupus ([place])   . OSA (obstructive sleep apnea) 10/16/2015  . Restless leg syndrome    Past Surgical  History  Procedure Laterality Date  . Medial partial knee replacement      LT knee  . Other surgical history      metal pin/screw to repair arthritis in LT thumb joint  . Hand surgery      left thumb surgery  . Dilation and curettage of uterus      2008-polyp removed  . Robotic assisted total hysterectomy with bilateral salpingo oopherectomy  12/20/2012    Procedure: ROBOTIC ASSISTED TOTAL HYSTERECTOMY WITH BILATERAL SALPINGO OOPHORECTOMY;  Surgeon: Imagene Gurney A. Alycia Rossetti, MD;  Location: [facility];  Service: Gynecology;  Laterality: N/A;  WITH PELVIC PARAAORTIC   . Robotic pelvic and para-aortic lymph node dissection  12/20/2012    Procedure: ROBOTIC PELVIC AND PARA-AORTIC LYMPH NODE DISSECTION;  Surgeon: Imagene Gurney A. Alycia Rossetti, MD;  Location: [facility];  Service: Gynecology;  Laterality: Bilateral;  . Colonoscopy    . Mass excision N/A 10/05/2014    Procedure: EXCISION OF BILATERAL LOWER BACK MASSES;  Surgeon: Coralie Keens, MD;  Location: [facility];  Service: General;  Laterality: N/A;   Family History  Problem Relation Age of Onset  . Stroke Mother 12    arterial wall of artery in eye   . Endometrial cancer Mother 74  . Dementia Father   . Stroke Father     "lots of many strokes"  . Aneurysm Father 70    AAA  .  Multiple births Maternal Grandmother     died 5 days after delivery of unknown causes  . Colon cancer Maternal Aunt     late 74s  . Cancer Cousin     maternal cousin with an unknown form of cancer   Social History  Substance Use Topics  . Smoking status: Never Smoker   . Smokeless tobacco: None  . Alcohol Use: Yes     Comment: occassional   OB History    Gravida Para Term Preterm AB TAB SAB Ectopic Multiple Living   0              Review of Systems  Constitutional: Positive for appetite change. Negative for fever, chills, diaphoresis and fatigue.  HENT: Negative for congestion, postnasal drip and rhinorrhea.   Respiratory: Negative for cough, chest tightness  and shortness of breath.   Cardiovascular: Negative for chest pain and palpitations.  Gastrointestinal: Positive for nausea, vomiting, abdominal pain and constipation. Negative for diarrhea.  Genitourinary: Negative for dysuria, urgency and frequency.  Musculoskeletal: Negative for myalgias and back pain.  Skin: Negative for rash.  Neurological: Negative for dizziness, weakness, light-headedness and headaches.  Hematological: Does not bruise/bleed easily.      Allergies  Cefdinir; Codeine; and Penicillins  Home Medications   Prior to Admission medications   Medication Sig Start Date End Date Taking? Authorizing Provider  Alpha Lipoic Acid 200 MG CAPS Take 600 mg by mouth daily.    Yes Historical Provider, MD  ASTRAGALUS PO Take 800 mg by mouth daily.    Yes Historical Provider, MD  AZELASTINE HCL NA Place 1 spray into the nose daily as needed (congestion).    Yes Historical Provider, MD  BLACK COHOSH PO Take 800 mg by mouth daily.   Yes Historical Provider, MD  Ferrous Fumarate-Vitamin C ER (FERRO-SEQUELS) 65-25 MG TBCR Take 1 tablet by mouth daily.   Yes Historical Provider, MD  fexofenadine (ALLEGRA) 60 MG tablet Take 60 mg by mouth daily as needed for allergies or rhinitis.   Yes Historical Provider, MD  fluocinonide gel (LIDEX) 0.05 % APPLY TO AFFECTED AREA EVERY DAY 08/21/15  Yes Historical Provider, MD  ibuprofen (ADVIL,MOTRIN) 800 MG tablet Take 800 mg by mouth every 8 (eight) hours as needed for headache, mild pain or moderate pain.  01/22/14  Yes Historical Provider, MD  Astrid Drafts Omega-3 500 MG CAPS Take 2 capsules by mouth daily.   Yes Historical Provider, MD  magic mouthwash SOLN Swish and spit 10 mLs daily as needed for mouth pain.  08/21/15  Yes Historical Provider, MD  Multiple Vitamin (MULTIVITAMIN WITH MINERALS) TABS Take 1 tablet by mouth daily.   Yes Historical Provider, MD  pseudoephedrine (SUDAFED) 120 MG 12 hr tablet Take 120 mg by mouth daily as needed for  congestion.   Yes Historical Provider, MD  ranitidine (ZANTAC) 150 MG tablet Take 150 mg by mouth daily as needed for heartburn.    Yes Historical Provider, MD  SYNTHROID 75 MCG tablet Take 75 mcg by mouth daily before breakfast.  10/11/15  Yes Historical Provider, MD  triamcinolone cream (KENALOG) 0.1 % Apply 1 application topically daily as needed (rash).  10/30/13  Yes Historical Provider, MD  vitamin B-12 (CYANOCOBALAMIN) 1000 MCG tablet Take 1,000 mcg by mouth daily.   Yes Historical Provider, MD  pramipexole (MIRAPEX) 0.25 MG tablet Take 1 tablet (0.25 mg total) by mouth Nightly. Patient not taking: Reported on 11/03/2015 10/28/15   Larey Seat, MD   BP  138/81 mmHg  Pulse 107  Temp(Src) 97.9 F (36.6 C) (Oral)  Resp 16  SpO2 100% Physical Exam  Constitutional: She is oriented to person, place, and time. She appears well-developed and well-nourished. No distress.  HENT:  Head: Normocephalic and atraumatic.  Right Ear: External ear normal.  Left Ear: External ear normal.  Nose: Nose normal.  Mouth/Throat: Oropharynx is clear and moist. No oropharyngeal exudate.  Eyes: EOM are normal. Pupils are equal, round, and reactive to light.  Neck: Normal range of motion. Neck supple.  Cardiovascular: Normal rate, regular rhythm, normal heart sounds and intact distal pulses.   No murmur heard. Pulmonary/Chest: Effort normal. No respiratory distress. She has no wheezes. She has no rales.  Abdominal: Soft. She exhibits distension (mild). There is tenderness (mild generalized).  Genitourinary: Rectal exam shows no internal hemorrhoid, no mass and no tenderness.  Patient actively passing gas at time of rectal examination.  No blood or clots in rectum.  Rectal vault feels empty  Musculoskeletal: Normal range of motion. She exhibits no edema or tenderness.  Neurological: She is alert and oriented to person, place, and time.  Skin: Skin is warm and dry. No rash noted. She is not diaphoretic.   Vitals reviewed.   ED Course  Procedures (including critical care time) Labs Review Labs Reviewed  COMPREHENSIVE METABOLIC PANEL - Abnormal; Notable for the following:    Glucose, Bld 115 (*)    All other components within normal limits  CBC - Abnormal; Notable for the following:    Hemoglobin 15.1 (*)    All other components within normal limits  URINALYSIS, ROUTINE W REFLEX MICROSCOPIC (NOT AT [facility]) - Abnormal; Notable for the following:    Hgb urine dipstick TRACE (*)    Leukocytes, UA SMALL (*)    All other components within normal limits  URINE MICROSCOPIC-ADD ON - Abnormal; Notable for the following:    Squamous Epithelial / LPF 0-5 (*)    All other components within normal limits  LIPASE, BLOOD  POC OCCULT BLOOD, ED    Imaging Review Ct Abdomen Pelvis W Contrast  11/03/2015  CLINICAL DATA:  Upper abdominal pain, vomiting and constipation. Gastrointestinal bleeding for the past 3 days. Bright red blood per rectum. Status post hysterectomy and chemotherapy for endometrial cancer 1 year ago. EXAM: CT ABDOMEN AND PELVIS WITH CONTRAST TECHNIQUE: Multidetector CT imaging of the abdomen and pelvis was performed using the standard protocol following bolus administration of intravenous contrast. CONTRAST:  173mL OMNIPAQUE IOHEXOL 300 MG/ML  SOLN COMPARISON:  Previous examinations, the most recent dated 05/31/2015. FINDINGS: Lower chest: 11 mm right lower lobe lung nodule on the first image. This also measured 11 mm on 01/10/2014 and was not included on 05/31/2015. No new nodules. Stable minimal linear scarring at the left lung base. Hepatobiliary: Stable small oval, densely calcified mass in the dome of the liver on the right. Mild focal fat deposition in the anterior aspect of the medial segment of the left lobe of the liver, adjacent to the falciform ligament, is unchanged. No interval liver masses. Pancreas: No mass, inflammatory changes, or other significant abnormality. Spleen: Within  normal limits in size and appearance. Adrenals/Urinary Tract: Normal appearing adrenal glands. Stable left renal cyst. No mass, calculi or hydronephrosis seen. Stomach/Bowel: Diffuse low density rectal wall thickening with mild perirectal soft tissue stranding. The maximum wall thickness is 11 mm. The sigmoid and descending colon remain normal in caliber. The transverse and right colon are dilated, with progression. No obstructing  mass seen. There is partially liquefied stool throughout the colon. No gastric or small bowel abnormalities are seen. No evidence of appendicitis. Vascular/Lymphatic: Minimal atheromatous arterial calcifications. No enlarged lymph nodes. Reproductive: Surgically absent uterus and ovaries. Other: None. Musculoskeletal:  Lumbar lower thoracic spine degenerative changes. IMPRESSION: 1. Interval changes of proctitis. This could be infectious or inflammatory nature. Postradiation changes can also have this appearance. 2. Prominent, partially liquefied stool throughout the colon with progressive ileus of the right and transverse colon. 3. Stable probably benign right lower lobe nodule. 4. No evidence of metastatic disease or tumor recurrence. Electronically Signed   By: Claudie Revering M.D.   On: 11/03/2015 19:24   I have personally reviewed and evaluated these images and lab results as part of my medical decision-making.   EKG Interpretation None      MDM  Patient seen and evaluated in stable condition.  CT with findings of proctitis as well as ileus and loose stool throughout colon.  Patient reports that after contrast she passed a large bowel movement.  Vitals stable and Hb stable.  No sign of hemorrhage.  Discussed results with patient who says she feels well enough she would like to go home and that she has a GI doctor she will arrange follow up with.  She was given her first dose of Cipro/Flagyl here and provided prescriptions.  She was given strict return precautions which she  expressed understanding of and she was discharged home in stable condition. Final diagnoses:  None    1. Proctitis  2. Ileus    Harvel Quale, MD 11/03/15 2025

## 2015-11-03 NOTE — Discharge Instructions (Signed)
You were seen for your abdominal discomfort, constipation, and blood in your stool.  You were found to have an area of your intestines that appears to be moving slowly (ileus) and inflammation in part of your colon (proctitis).  Take the medications as prescribed.  Follow up with your primary care physician and gastroenterologist.  Ileus  Ileus is a condition in which the intestines, also called the bowels, stop working and moving correctly. If the intestines stop working, food cannot pass through to get digested. The intestines are hollow organs that digest food after the food leaves the stomach. These organs are long, muscular tubes that connect the stomach to the rectum. When ileus occurs, the muscular contractions that cause food to move through the intestines stop happening as they normally would. Ileus can occur for various reasons. This condition is a serious problem that usually requires hospitalization. It can cause symptoms such as nausea, abdominal pain, and bloating. Ileus can last from a few hours to a few days. If the intestines stop working because of a blockage, that is a different condition that is called a bowel obstruction. CAUSES This condition may be caused by:  Surgery on the abdomen.  An infection or inflammation in the abdomen. This includes inflammation of the lining of the abdomen (peritonitis).  Infection or inflammation in other parts of the body, such as pneumonia or pancreatitis.  Passage of gallstones or kidney stones.  Damage to the nerves or blood vessels that go to the intestines.  A collection of blood within the abdominal cavity.  Imbalance in the salts in the blood (electrolytes).  Injury to the brain or spinal cord.  Medicines. Many medicines, including strong pain medicines, can cause ileus or make it worse. SYMPTOMS Symptoms of this condition include:  Bloating of the abdomen.  Pain or discomfort in the abdomen.  Poor appetite.  Nausea and  vomiting.  Lack of normal bowel sounds, such as "growling" in the stomach. DIAGNOSIS This condition may be diagnosed with:  A physical exam and medical history.  X-rays or a CT scan of the abdomen. You may also have other tests to help find the cause of the condition. TREATMENT Treatment for this condition may include:  Resting the intestines until they start to work again. This is often done by:  Stopping oral intake of food and drink. You will be given fluid through an IV tube to prevent dehydration.  Placing a small tube (nasogastric tube or NG tube) that is passed through your nose and into your stomach. The tube is attached to a suction device and keeps the stomach emptied out. This allows the bowels to rest and also helps to reduce nausea and vomiting.  Correcting any electrolyte imbalance by giving supplements in the IV fluid.  Stopping any medicines that might make ileus worse.  Treating any condition that may have caused ileus. HOME CARE INSTRUCTIONS  Follow instructions from your health care provider about diet and fluid intake. Usually, you will be told to:  Drink plenty of clear fluids.  Avoid alcohol.  Avoid caffeine.  Eat a bland diet.  Get plenty of rest. Return to your normal activities as told by your health care provider.  Take over-the-counter and prescription medicines only as told by your health care provider.  Keep all follow-up visits as told by your health care provider. This is important. SEEK MEDICAL CARE IF:  You have nausea, vomiting, or abdominal discomfort.  You have a fever. SEEK IMMEDIATE MEDICAL CARE IF:  You have severe abdominal pain or bloating.  You cannot eat or drink without vomiting.   This information is not intended to replace advice given to you by your health care provider. Make sure you discuss any questions you have with your health care provider.   Document Released: 11/19/2003 Document Revised: 08/07/2015 Document  Reviewed: 01/10/2015 Elsevier Interactive Patient Education 2016 [employer]   Proctitis Proctitis is swelling and soreness (inflammation) of the lining of the rectum. The rectum is at the end of the large intestine, and it leads to the anus. The inflammation causes pain and discomfort. It may be a short-term (acute) or long-lasting (chronic) problem. CAUSES This condition may be caused by:  STDs (sexually transmitted diseases).  Infection.  Trauma or injury to the anus or rectum.  Ulcerative colitis or Crohn disease.  Radiation therapy that is directed near the rectum.  Antibiotic therapy. SYMPTOMS Symptoms of this condition include:  Sudden, uncomfortable, and frequent urge to have a bowel movement.  Anal pain or rectal pain.  Pain or cramping in the abdomen.  Sensation that the rectum is full.  Rectal bleeding.  Pus or mucus discharge from the anus.  Diarrhea or frequent soft, loose stools.  Constipation.  Pain with bowel movements. DIAGNOSIS This condition may be diagnosed based on:  A medical history and physical exam.  Various tests, such as:  An STD test.  Blood tests.  Stool tests.  Rectal culture.  A procedure to evaluate the anal canal (anoscopy).  Procedures to look at the entire large bowel or part of it (colonoscopy or sigmoidoscopy). TREATMENT Treatment for this condition depends on the cause. The main goals of treatment are to reduce the symptoms of inflammation and to get rid of any infection. Treatment may include:  Home remedies and lifestyle changes, such as sitz baths and avoiding food right before bedtime.  Medicines, such as:  Topical ointments, foams, suppositories, or enemas, such as corticosteroids or anti-inflammatories.  Antibiotic or antiviral medicines to treat infection or to control harmful bacteria.  Medicines to control diarrhea, soften stools, and reduce pain.  Medicines to suppress the immune  system.  Nutritional, dietary, or herbal supplements.  Avoiding the activity that caused rectal trauma.  Heat or laser therapy for persistent bleeding.  A dilation procedure to enlarge a narrowed rectum.  Surgery to repair damaged rectal lining. This is rare. If your proctitis was caused by an STD, your health care provider may test you for infection again 3 months after treatment. HOME CARE INSTRUCTIONS  Take over-the-counter and prescription medicines only as told by your health care provider.  If you were prescribed an antibiotic medicine, take it as told by your health care provider. Do not stop taking the antibiotic even if you start to feel better.  Try to avoid eating right before bedtime.  Take sitz baths as told by your health care provider.  Keep all follow-up visits as told by your health care provider. This is important. SEEK MEDICAL CARE IF:  Your symptoms do not improve with treatment.  Your symptoms get worse.  You have a fever.   This information is not intended to replace advice given to you by your health care provider. Make sure you discuss any questions you have with your health care provider.   Document Released: 11/05/2011 Document Revised: 08/07/2015 Document Reviewed: 02/11/2015 Elsevier Interactive Patient Education [employer].

## 2015-11-07 ENCOUNTER — Telehealth: Payer: Self-pay

## 2015-11-07 NOTE — Telephone Encounter (Signed)
Spoke to pt and advised her that her sleep study showed hypoxemia unrelated to apnea, and RLS, but no sleep apnea. There were frequent PLMS of her sleep. Pt had not started taking her mirapex at the time of the study. I advised pt that oxygen levels may improve with correction of possible anemia and with the introduction of an exercise program.  Pt is sick with a colon infection, she went to the ED on Sunday. She wants a follow up appt with Dr. Brett Fairy to discuss these results in greater detail before she decides on a sleep psychology referral for insomnia and more medications for her RLS. A follow up appt was made with pt for 12/20 at 2:30. Pt verbalized understanding.

## 2015-11-19 ENCOUNTER — Encounter: Payer: Self-pay | Admitting: Neurology

## 2015-11-19 ENCOUNTER — Ambulatory Visit (INDEPENDENT_AMBULATORY_CARE_PROVIDER_SITE_OTHER): Payer: Medicare Other | Admitting: Neurology

## 2015-11-19 VITALS — BP 118/78 | HR 94 | Resp 20 | Ht 67.0 in | Wt 174.0 lb

## 2015-11-19 DIAGNOSIS — G4761 Periodic limb movement disorder: Secondary | ICD-10-CM

## 2015-11-19 DIAGNOSIS — R0902 Hypoxemia: Secondary | ICD-10-CM | POA: Diagnosis not present

## 2015-11-19 NOTE — Progress Notes (Signed)
NEUROLOGY CLINIC FOLLOW UP PATIENT NOTE  NAME: Brooke Williams DOB: June 14, 1946  History summary:  Brooke Williams is a 69 y.o. female with PMH of lupus, endometrial cancer s/p hysterectomy and chemotherapy, lipoma s/p surgery who presents as a new patient for Sleep apnea,  Brooke Williams is a new patient to our sleep clinic, she moved from [place] 15 years ago to [place]. She underwent a sleep study at the [facility] hardened sleep center where she was followed by Dr. Ellouise Newer her cardiologist. The study is dated 08-05-11 and was ordered upon the patient's report of excessive daytime sleepiness, waking up with headaches in the morning and feeling that her sleep was not restorative or refreshing. She had endorsed the Becks depression inventory at 16 points at that time and the Epworth scale at 12 points. She also had been told that she was a loud snorer and sometimes had difficulties falling asleep while still feeling very sleepy. The sleep problem had already persisted for longer than 2 years at the time that she was tested. Her sleep study had revealed an AHI of only 4.3 and during REM sleep at 13.1. The RDI which includes arousals from snoring and not just apneas, was 17.9 and during REM sleep increased to 39. The patient had extremely mild sleep apnea but a moderate to severe degree of upper airway resistance. She also support during her sleep study. She had significant periodic limb movements and Dr. Claiborne Billings reported 28.5 per hour were causing arousals which would be a more severe sleep disorder in her apnea was.  In the meantime the patient states that her restless legs which were present 4 years ago have progressed to a more severe degree. She goes to bed between 10 and 11 PM and usually falls asleep within 1 hour, some nights it may take 2 hours. As soon as she retreats to bed and watches television for example she gets the symptoms of restless legs. She wakes up routinely  about every 90 minutes during the night and sometimes she will go to the bathroom but it was not the bathroom call that woke her. She will rise in the morning at about 7 AM spontaneously. She remains and that for another 1-1/2 hours or so before she has the energy to get up. She still feels her sleep is not restorative or refreshing. She wakes frequently up with a headache, her headaches are there all the time but she reports the most severe part of the day in the morning. The headaches have not woken her but she wakes up with the headaches and is aware of them. He will also often go to bed with some headaches. The headaches are described as a pressure to the for head. There is no retro-orbital pressure and it does not temporal. It is does not prefer one side versus the other. She sometimes gets nauseated with her headaches lasting all day. There is a dizziness with headaches, feeling swimmy headed.  She sleeps on her left, avoids supine sleep. The bedroom is cool, quiet and dark.   Dr Phoebe Sharps note from October : Patient stated that she was diagnosed with endometrial cancer and had robotic hysterectomy in January 2014. However, in February 2015, she was found to have recurrent focal soft tissue tumor at the site of hysterectomy, consider endometrial cancer recurrence, she received chemotherapy for about 6 months. 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 Had a some of the  lipoma removed.   She has long-standing migraine headache for more than 30 years. The headache involves whole head, bandage like, 5-10/10 severity, associated with photophobia, no phonophobia or nausea vomiting. It happens several times a week, lasting 2 days, she has to lie down to help the headache. No identifiable triggers. She has tried more than 20 different medications, his symptoms only Robaxin and later gabapentin helped to take the edge off the pain. Since 2013, the headache getting  worse both in severity and the frequency. However, for the last 4 months, the headaches changed their pattern, became frontal headache currently, pressure type headache, constant, never went away, associate with joint ice, nose irritation, fatigue all day long. Gabapentin does not work this time, also tried icing, rubbing, topical cream, nothing worked. Also has intermittent blurry vision, she is going to see an eye doctor tomorrow.  She has history of a hypothyroidism, on Synthroid supplement. She went to see endocrinologist, checked TSH and T4 within normal limits, and also ruled out other endocrine disorders. However she was advised to have MRI done to rule out pituitary tumor due to complaints of blurred vision. She also follows with rheumatology for her lupus, she has had lupus since 2008, has been treated with plaqenil. Currently off the medication last oncology visit in July 2016 showed normal C3, C4, RF, ESR, negative ANA and Cardiolipin antibodies.  She has sleep study over 4  years ago was told that the treatment would require CPAP< OSA, but she did not  qualify for CPAP machine. However, she has constant insomnia, usually spend an hour before falling asleep, and able to sleep 2 hours longest. She denies any smoking, alcohol drinking, or illicit drugs.  11-19-15  I'm meeting today with Brooke Williams regarding the recent polysomnography results. The patient suffered a paralytic ileus on December 4 and has correlated the lower stool frequency with the days of iron intake. I have requested her to take this equal iron supplement but she will only take it from now on every other day. Her sleep study did not reveal any apnea or shallow breathing. She had 34 minutes of lower oxygen saturation throughout the whole night study no evidence for CO2 retention. There was very little sleep actually recorded only 167 minutes of sleep were present during the sleep study. Her sleep seem to be fragmented by  periodic limb movements also these seem not to cause directly arousals. This could be a pattern of restless leg syndrome manifestation. This is often associated with iron deficiency anemia. The patient will also continue at a lower frequency to take the iron supplement pharmacotherapy will be discussed today and I would like for her to undergo pulmonary function testing with a pulmonologist given that she is an endometrial cancer survivor and may have had chemotherapy related changes.  Interval history: During the interval time, pt stated that she has been doing the same. HA still there, sometimes pounding on the waking up in the morning. When she had her migraine in the past, she found only robaxin PRN was helping some. She has not used any robaxin yet. She had treadmill test and bone scan were both unremarkable. Her MRI with and without contrast showed no pituitary tumor. She still has difficulty with sleep and snores with bad quality of sleep.  Epworth and Depression score, fatigue score were quoted above.    Past Medical History  Diagnosis Date  . Lupus ([place])     in remission for 5-6 yrs, not on  meds now.(more skin reactions,joint pain, sores in mouth)  . PONV (postoperative nausea and vomiting)   . Seasonal allergies   . GERD (gastroesophageal reflux disease)     controls with Zantac  . Headache(784.0)     hx. migraines  . Arthritis     arthritis-hands. cervical disc degeneration  . Endometrial cancer ([place]) 2014  . Lupus ([place])   . OSA (obstructive sleep apnea) 10/16/2015  . Restless leg syndrome    Past Surgical History  Procedure Laterality Date  . Medial partial knee replacement      LT knee  . Other surgical history      metal pin/screw to repair arthritis in LT thumb joint  . Hand surgery      left thumb surgery  . Dilation and curettage of uterus      2008-polyp removed  . Robotic assisted total hysterectomy with bilateral salpingo oopherectomy  12/20/2012    Procedure:  ROBOTIC ASSISTED TOTAL HYSTERECTOMY WITH BILATERAL SALPINGO OOPHORECTOMY;  Surgeon: Imagene Gurney A. Alycia Rossetti, MD;  Location: [facility];  Service: Gynecology;  Laterality: N/A;  WITH PELVIC PARAAORTIC   . Robotic pelvic and para-aortic lymph node dissection  12/20/2012    Procedure: ROBOTIC PELVIC AND PARA-AORTIC LYMPH NODE DISSECTION;  Surgeon: Imagene Gurney A. Alycia Rossetti, MD;  Location: [facility];  Service: Gynecology;  Laterality: Bilateral;  . Colonoscopy    . Mass excision N/A 10/05/2014    Procedure: EXCISION OF BILATERAL LOWER BACK MASSES;  Surgeon: Coralie Keens, MD;  Location: [facility];  Service: General;  Laterality: N/A;   Family History  Problem Relation Age of Onset  . Stroke Mother 22    arterial wall of artery in eye   . Endometrial cancer Mother 29  . Dementia Father   . Stroke Father     "lots of many strokes"  . Aneurysm Father 9    AAA  . Multiple births Maternal Grandmother     died 5 days after delivery of unknown causes  . Colon cancer Maternal Aunt     late 45s  . Cancer Cousin     maternal cousin with an unknown form of cancer   Current Outpatient Prescriptions  Medication Sig Dispense Refill  . Alpha Lipoic Acid 200 MG CAPS Take 600 mg by mouth daily.     . ASTRAGALUS PO Take 800 mg by mouth daily.     . AZELASTINE HCL NA Place 1 spray into the nose daily as needed (congestion).     Marland Kitchen BLACK COHOSH PO Take 800 mg by mouth daily.    . Ferrous Fumarate-Vitamin C ER (FERRO-SEQUELS) 65-25 MG TBCR Take 1 tablet by mouth daily.    . fexofenadine (ALLEGRA) 60 MG tablet Take 60 mg by mouth daily as needed for allergies or rhinitis.    . fluocinonide gel (LIDEX) 0.05 % APPLY TO AFFECTED AREA EVERY DAY  1  . ibuprofen (ADVIL,MOTRIN) 800 MG tablet Take 800 mg by mouth every 8 (eight) hours as needed for headache, mild pain or moderate pain.     Astrid Drafts Omega-3 500 MG CAPS Take 2 capsules by mouth daily.    . magic mouthwash SOLN Swish and spit 10 mLs daily as needed for  mouth pain.   11  . Multiple Vitamin (MULTIVITAMIN WITH MINERALS) TABS Take 1 tablet by mouth daily.    . pramipexole (MIRAPEX) 0.25 MG tablet Take 1 tablet (0.25 mg total) by mouth Nightly. 90 tablet 1  . pseudoephedrine (SUDAFED) 120 MG  12 hr tablet Take 120 mg by mouth daily as needed for congestion.    . ranitidine (ZANTAC) 150 MG tablet Take 150 mg by mouth daily as needed for heartburn.     Marland Kitchen SYNTHROID 75 MCG tablet Take 75 mcg by mouth daily before breakfast.     . triamcinolone cream (KENALOG) 0.1 % Apply 1 application topically daily as needed (rash).     . vitamin B-12 (CYANOCOBALAMIN) 1000 MCG tablet Take 1,000 mcg by mouth daily.     No current facility-administered medications for this visit.   Allergies  Allergen Reactions  . Cefdinir   . Codeine Itching    Facial flushing  . Penicillins Hives    Has patient had a PCN reaction causing immediate rash, facial/tongue/throat swelling, SOB or lightheadedness with hypotension: No pt states she mostly had hives  Has patient had a PCN reaction causing severe rash involving mucus membranes or skin necrosis: No Has patient had a PCN reaction that required hospitalization: No Has patient had a PCN reaction occurring within the last 10 years: Yes If all of the above answers are "NO", then may proceed with Cephalosporin use.      Social History   Social History  . Marital Status: Married    Spouse Name: N/A  . Number of Children: 0  . Years of Education: N/A   Occupational History  . Not on file.   Social History Main Topics  . Smoking status: Never Smoker   . Smokeless tobacco: Not on file  . Alcohol Use: Yes     Comment: occassional  . Drug Use: No  . Sexual Activity: Not Currently    Birth Control/ Protection: Post-menopausal   Other Topics Concern  . Not on file   Social History Narrative   Patient does not have any biological children but has step children    Review of Systems Full 14 system review of  systems performed and notable only for those listed, all others are neg:  Constitutional:   Fatigue, EDS , morning headaches.   Physical Exam  Filed Vitals:   11/19/15 1420  BP: 118/78  Pulse: 94  Resp: 20    General - obese, well developed, in no apparent distress.   Cardiovascular - Regular rate and rhythm with no murmur. Carotid pulses were 2+ without bruits .   Neck - supple, no nuchal rigidity.  Neck circumference is 15-1/2 inches, Mallampati is grade 4 with a narrow and peaked palate. There is no redness or swelling of the upper airway noted. The patient has a slight retrognathia and a small lower jaw.    Mental Status -  Level of arousal and orientation to time, place, and person were intact. Language including expression, naming, repetition, comprehension, reading, and writing was assessed and found intact. Attention span and concentration were normal. Recent and remote memory were intact. Fund of Knowledge was assessed and was intact.  Cranial Nerves II - XII - The patient reports a change in her taste sensitivity ever since undergoing chemotherapy. A metallic aftertaste. Chemotherapy concluded a 16 month ago. Her ability to smell is unchanged. Visual field intact OU.Extraocular movements intact. Facial sensation intact bilaterally.Facial movement intact bilaterally. Hearing & vestibular intact bilaterally. No lateralization by Brooke Williams .   Palate elevates symmetrically. Tongue midline. Chin turning & shoulder shrug intact bilaterally.  Motor Strength - The patient's strength was normal in all extremities and pronator drift was absent.  Bulk was normal and fasciculations were absent.  Muscle tone was normal.  Reflexes - The patient's reflexes were normal in all extremities .  Sensory - Light touch, temperature/pinprick, vibration and proprioception, and Romberg testing were assessed and were normal.     Imaging  I have personally reviewed the radiological  images below and agree with the radiology interpretations.  MRI head without contrast 10/2012 Bilateral supratentorial subcentimeter foci of white matter signal abnormality; see discussion above. No acute intracranial abnormality.  MRI with and without contrast 09/10/15 1. Scattered periventricular and subcortical foci of T2 hyperintensities. No abnormal lesions are seen on post contrast views. These findings are non-specific and considerations include autoimmune, inflammatory, post-infectious, microvascular ischemic or migraine associated etiologies. 2. Normal pituitary gland. No acute findings. 3. Compared to MRI on 11/15/12, there has been slight increase in white matter disease.  Lab Review 06/17/15 Creatinine 0.79, AST 25, ALT 31, WBC 5.6, hemoglobin 14, platelet 241, ESR 17, TSH 3.246, C3 143, C4 33, RF less than 10, ANA negative, serum protein electrophoresis within normal limits, cardiolipin antibodies negative, anti-double strand DNA negative, SSA, SSB, anti-Smith antibody, SCL 70 all negative.  08/16/2015, TSH 2.73, free T4 1.54   Component     Latest Ref Rng 09/06/2015  Cholesterol     125 - 200 mg/dL 216 (H)  Triglycerides     <150 mg/dL 160 (H)  HDL Cholesterol     >=46 mg/dL 58  Total CHOL/HDL Ratio     <=5.0 Ratio 3.7  VLDL     <30 mg/dL 32 (H)  LDL (calc)     <130 mg/dL 126  TSH     0.350 - 4.500 uIU/mL 3.618   I was able to review the patient's original sleep study from 08-05-11 at the [facility] hardened sleep center , as interpreted by Dr. Shelva Majestic.  The patient had such mild apnea that a CPAP titration was not justified. She does have retrognathia and she reports been witnessed to snore. I think that her morning headaches may be related to hypoxemia at night also this was not found 4 years ago. She does have significant periodic limb movements and these correlates with her report of restless legs. It is the restless legs that delay her sleep onset. I would  like for the patient to be treated for restless legs she has never taken restless leg medications also these were recommended after her sleep study. I would certainly consider to order a sleep aid should the restless leg medication not allow her to go to sleep easier and maintain sleep better. In addition I would like to obtain a new sleep study but able to the sleep study after I have written for medication and treatment for restless legs.   Assessment:   In summary, Brooke Williams is a 69 y.o. female with PMH of lupus off plaquenil, endometrial cancer s/p hysterectomy and chemotherapy, lipoma s/p surgery, hypothyroidism, borderline OSA, and long-standing migraine headache presents constant frontal mild headache for the last 4 months without relief which is different from her previous migraine headache. She went to see endocrinologist for hypothyroidism, TSH and free T4 normal, ruled out other endocrine disorders, however recommend to have MRI done to rule out pituitary tumor due to complains of blurry vision. On exam, there is no visual field deficit, and fundi exam was normal. Headache most consistent with tension headache due to anxiety. MRI with and without contrast ruled out structural lesions.  I was able to review the patient's original sleep study from 08-05-11 at the [facility]  hardened sleep center , as interpreted by Dr. Shelva Majestic.  The patient had such mild apnea that a CPAP titration was not justified. She does have retrognathia and she reports been witnessed to snore. I think that her morning headaches may be related to hypoxemia at night also this was not found 4 years ago. She does have significant periodic limb movements and these correlates with her report of restless legs. It is the restless legs that delay her sleep onset. I would like for the patient to be treated for restless legs she has never taken restless leg medications also these were recommended after her sleep study. I would  certainly consider to order a sleep aid should the restless leg medication not allow her to go to sleep easier and maintain sleep better. In addition I would like to obtain a new sleep study but able to the sleep study after I have written for medication and treatment for restless legs. The patient reports that she had terrible iron deficiency and anemia even as a teenager. With a history of endometrial cancer and chemotherapy I can imagine that her iron deficiency has been exacerbated. And this would correlate with a worsening of her restless leg symptoms as she describes a timeline.    Plan: -The patient will start Mirapex at a very low-dose at least one hour before intended bedtime. This may help her to sustain sleep and have a shortened sleep latency. My hope is that the periodic limb movements would be controlled and would help her to sleep longer and more sound.  The patient has constant restless moving legs even when she watches TV. Iron deficiency anemia can be contributing to this and she will start iron sequels every other day.  She just overcame a paralytic ileus the onset of symptoms and the reduce stool frequency on days of iron intake has been evident to her. For this reason she will only take it every other day.anemia can be a cause of restless leg syndrome.   I will also refer the patient for pulmonary function tests was primary care or pulmonary specialist. [place] Dr. Juanita Craver,  I will refer her to Dr. Lamonte Sakai or Dr. Halford Chessman at Pulmonology. Larey Seat, MD  I spent more than 25 minutes of face to face time with the patient. Greater than 50% of time was spent in counseling and coordination of care.  We have discussed the sleep sudy results, absence of apnea and presence of PLM-  and RLS.  The possible comorbidities of iron deficiency anemia and iron intake related Ileus.   [place], [facility] [address] [place], [place]  [address] [phone]

## 2015-11-21 ENCOUNTER — Ambulatory Visit (INDEPENDENT_AMBULATORY_CARE_PROVIDER_SITE_OTHER): Payer: Medicare Other | Admitting: Internal Medicine

## 2015-11-21 ENCOUNTER — Encounter: Payer: Self-pay | Admitting: Internal Medicine

## 2015-11-21 VITALS — BP 128/88 | HR 102 | Ht 67.0 in | Wt 176.6 lb

## 2015-11-21 DIAGNOSIS — J9611 Chronic respiratory failure with hypoxia: Secondary | ICD-10-CM | POA: Insufficient documentation

## 2015-11-21 NOTE — Progress Notes (Signed)
Subjective:    Patient ID: Brooke Williams, female    DOB: November 29, 1946,   MRN: [id number]  HPI   41 yowf never smoker with dx of sle 1996 rx plaquenil stopped in 2008 and no recurrence underwent sleep eval for freq disruption and fatigue since June 2016 and neg PSG 11/01/15  except for restless legs and decreased sats to a nadir of 88% so referred to pulmonary clinic 11/21/2015 by Dr Brett Fairy     11/21/2015 1st [facility] office visit/ Brooke Williams   Chief Complaint  Patient presents with  . PULMONARY CONSULT    Referred by Dr. Brett Fairy. Pt c/o waking though the night, waking with headaches in the mornings, excessive fatigue and daytime somnolence. Pt has had sleep study done that showed nocturnal hypoxia.   finished chemo for endometrial ca July 2015 taxol and carboplat  And not aware of any sign doe/ cough hs or any other time   No  cp or chest tightness, subjective wheeze or overt sinus or hb symptoms. No unusual exp hx or h/o childhood pna/ asthma or knowledge of premature birth.  Sleeping ok without nocturnal  or early am exacerbation  of respiratory  c/o's or need for noct saba. Also denies any obvious fluctuation of symptoms with weather or environmental changes or other aggravating or alleviating factors except as outlined above   Current Medications, Allergies, Complete Past Medical History, Past Surgical History, Family History, and Social History were reviewed in [employer] record.  .       Review of Systems  Constitutional: Negative.  Negative for fever and unexpected weight change.  HENT: Positive for congestion, ear pain and sinus pressure. Negative for dental problem, nosebleeds, postnasal drip, rhinorrhea, sneezing, sore throat and trouble swallowing.   Eyes: Negative.  Negative for redness and itching.  Respiratory: Negative.  Negative for cough, chest tightness, shortness of breath and wheezing.   Cardiovascular: Negative.  Negative for  palpitations and leg swelling.  Gastrointestinal: Negative.  Negative for nausea and vomiting.  Endocrine: Negative.   Genitourinary: Negative.  Negative for dysuria.  Musculoskeletal: Positive for myalgias and joint swelling. Negative for arthralgias.  Skin: Negative.  Negative for rash.  Allergic/Immunologic: Negative.   Neurological: Positive for headaches.  Hematological: Bruises/bleeds easily.  Psychiatric/Behavioral: Negative.  Negative for dysphoric mood. The patient is not nervous/anxious.        Objective:   Physical Exam  amb wf nad   Wt Readings from Last 3 Encounters:  11/21/15 176 lb 9.6 oz (80.105 kg)  11/19/15 174 lb (78.926 kg)  10/28/15 179 lb (81.194 kg)    Vital signs reviewed   HEENT: nl dentition, turbinates, and oropharynx. Nl external ear canals without cough reflex   NECK :  without JVD/Nodes/TM/ nl carotid upstrokes bilaterally   LUNGS: no acc muscle use,  Nl contour chest which is clear to A and P bilaterally without cough on insp or exp maneuvers   CV:  RRR  no s3 or murmur or increase in P2, no edema   ABD:  soft and nontender with nl inspiratory excursion in the supine position. No bruits or organomegaly, bowel sounds nl  MS:  Nl gait/ ext warm without deformities, calf tenderness, cyanosis or clubbing No obvious joint restrictions   SKIN: warm and dry without lesions    NEURO:  alert, approp, nl sensorium with  no motor deficits     I personally reviewed images and agree with radiology impression as follows:  CT  Chest  With contrast  11/18/15 Stable pulmonary nodules. No evidence of metastatic disease within the abdomen or pelvis.               Assessment & Plan:

## 2015-11-21 NOTE — Patient Instructions (Signed)
Please see patient coordinator before you leave today  to schedule for overnight pulse ox and I will call you with results

## 2015-11-22 ENCOUNTER — Encounter: Payer: Self-pay | Admitting: Internal Medicine

## 2015-11-22 ENCOUNTER — Ambulatory Visit: Payer: Medicare Other | Admitting: Cardiovascular Disease

## 2015-11-22 NOTE — Progress Notes (Signed)
I agree with the assessment and plan as directed by Dr Melvyn Novas .The patient is well known to me. Please copy me on ONO results.    Jaquann Guarisco, MD

## 2015-11-22 NOTE — Assessment & Plan Note (Addendum)
See PSG 12//2/16  37 min desats but nadir ws 88%  - 11/21/2015  Walked RA x 3 laps @ 185 ft each stopped due to  End of study, nl pace, no sob or desat   - ONO RA 11/21/2015 >>>   Strongly doubt accuracy or relevance of sats noct on PSG as there's no reason at all she should be hypoxic but if she is and qualifies for 02 (only marginally ) rec trial of 02 to see if it has any impact on her symptoms which seem to me more along the lines of unrelated RLS  Total time devoted to counseling  = 25/40 m review case with pt/ discussion of options/alternatives/ giving and going over instructions (see avs)

## 2015-11-29 ENCOUNTER — Telehealth: Payer: Self-pay | Admitting: Internal Medicine

## 2015-11-29 DIAGNOSIS — J9611 Chronic respiratory failure with hypoxia: Secondary | ICD-10-CM

## 2015-11-29 NOTE — Telephone Encounter (Signed)
ONO on RA done by [facility] on 11/26/15- per MW pt needs o2 2lpm and repeat ONO on 2lpm  Spoke with pt and notified of results per Dr. Melvyn Novas. Pt verbalized understanding and denied any questions. Orders sent to [facility]

## 2015-12-10 ENCOUNTER — Telehealth: Payer: Self-pay | Admitting: Internal Medicine

## 2015-12-10 NOTE — Telephone Encounter (Signed)
Per MW- ONO on 2lpm done by [facility] on 12/03/15 was normal, needs to continue to use 2lpm o2 with sleep  Spoke with pt and notified of results per Dr. Melvyn Novas. Pt verbalized understanding and denied any questions.

## 2015-12-12 ENCOUNTER — Encounter: Payer: Self-pay | Admitting: Internal Medicine

## 2015-12-13 ENCOUNTER — Other Ambulatory Visit (HOSPITAL_COMMUNITY): Payer: Self-pay | Admitting: Orthopedic Surgery

## 2015-12-13 DIAGNOSIS — M25562 Pain in left knee: Secondary | ICD-10-CM

## 2015-12-19 ENCOUNTER — Encounter (HOSPITAL_COMMUNITY)
Admission: RE | Admit: 2015-12-19 | Discharge: 2015-12-19 | Disposition: A | Discharge status: Home / Self Care | Payer: Medicare Other | Source: Ambulatory Visit | Attending: Orthopedic Surgery | Admitting: Orthopedic Surgery

## 2015-12-19 DIAGNOSIS — M25562 Pain in left knee: Secondary | ICD-10-CM | POA: Diagnosis not present

## 2015-12-19 MED ORDER — TECHNETIUM TC 99M MEDRONATE IV KIT
26.5000 | PACK | Freq: Once | INTRAVENOUS | Status: AC | PRN
  Administered 2015-12-19: 26.5 via INTRAVENOUS

## 2015-12-24 ENCOUNTER — Encounter: Payer: Self-pay | Admitting: Internal Medicine

## 2016-01-10 ENCOUNTER — Other Ambulatory Visit: Payer: Self-pay | Admitting: Orthopedic Surgery

## 2016-01-21 ENCOUNTER — Ambulatory Visit: Payer: Medicare Other | Admitting: Neurology

## 2016-01-24 ENCOUNTER — Encounter (HOSPITAL_COMMUNITY)
Admission: RE | Admit: 2016-01-24 | Discharge: 2016-01-24 | Disposition: A | Discharge status: Home / Self Care | Payer: Medicare Other | Source: Ambulatory Visit | Attending: Orthopedic Surgery | Admitting: Orthopedic Surgery

## 2016-01-24 ENCOUNTER — Telehealth: Payer: Self-pay | Admitting: Internal Medicine

## 2016-01-24 ENCOUNTER — Encounter (HOSPITAL_COMMUNITY): Payer: Self-pay

## 2016-01-24 DIAGNOSIS — J9610 Chronic respiratory failure, unspecified whether with hypoxia or hypercapnia: Secondary | ICD-10-CM | POA: Diagnosis not present

## 2016-01-24 DIAGNOSIS — R911 Solitary pulmonary nodule: Secondary | ICD-10-CM | POA: Insufficient documentation

## 2016-01-24 DIAGNOSIS — Z01812 Encounter for preprocedural laboratory examination: Secondary | ICD-10-CM | POA: Insufficient documentation

## 2016-01-24 DIAGNOSIS — Z79899 Other long term (current) drug therapy: Secondary | ICD-10-CM | POA: Diagnosis not present

## 2016-01-24 DIAGNOSIS — J9611 Chronic respiratory failure with hypoxia: Secondary | ICD-10-CM

## 2016-01-24 DIAGNOSIS — E785 Hyperlipidemia, unspecified: Secondary | ICD-10-CM | POA: Diagnosis not present

## 2016-01-24 DIAGNOSIS — G4733 Obstructive sleep apnea (adult) (pediatric): Secondary | ICD-10-CM | POA: Insufficient documentation

## 2016-01-24 DIAGNOSIS — Z01818 Encounter for other preprocedural examination: Secondary | ICD-10-CM | POA: Diagnosis not present

## 2016-01-24 DIAGNOSIS — Z8542 Personal history of malignant neoplasm of other parts of uterus: Secondary | ICD-10-CM | POA: Diagnosis not present

## 2016-01-24 DIAGNOSIS — E039 Hypothyroidism, unspecified: Secondary | ICD-10-CM | POA: Diagnosis not present

## 2016-01-24 DIAGNOSIS — Z0183 Encounter for blood typing: Secondary | ICD-10-CM | POA: Diagnosis not present

## 2016-01-24 DIAGNOSIS — Z9981 Dependence on supplemental oxygen: Secondary | ICD-10-CM | POA: Diagnosis not present

## 2016-01-24 DIAGNOSIS — K219 Gastro-esophageal reflux disease without esophagitis: Secondary | ICD-10-CM | POA: Diagnosis not present

## 2016-01-24 HISTORY — DX: Effusion, unspecified joint: M25.40

## 2016-01-24 HISTORY — DX: Pneumonia, unspecified organism: J18.9

## 2016-01-24 HISTORY — DX: Dizziness and giddiness: R42

## 2016-01-24 HISTORY — DX: Ileus, unspecified: K56.7

## 2016-01-24 HISTORY — DX: Dorsalgia, unspecified: M54.9

## 2016-01-24 HISTORY — DX: Personal history of other infectious and parasitic diseases: Z86.19

## 2016-01-24 HISTORY — DX: Pain in unspecified joint: M25.50

## 2016-01-24 HISTORY — DX: Hypothyroidism, unspecified: E03.9

## 2016-01-24 HISTORY — DX: Personal history of colon polyps, unspecified: Z86.0100

## 2016-01-24 HISTORY — DX: Nocturia: R35.1

## 2016-01-24 HISTORY — DX: Hyperlipidemia, unspecified: E78.5

## 2016-01-24 HISTORY — DX: Personal history of colonic polyps: Z86.010

## 2016-01-24 LAB — TYPE AND SCREEN
ABO/RH(D): B POS
Antibody Screen: NEGATIVE

## 2016-01-24 LAB — CBC WITH DIFFERENTIAL/PLATELET
Basophils Absolute: 0.1 10*3/uL (ref 0.0–0.1)
Basophils Relative: 1 %
Eosinophils Absolute: 0.2 10*3/uL (ref 0.0–0.7)
Eosinophils Relative: 3 %
HCT: 41.1 % (ref 36.0–46.0)
Hemoglobin: 13.5 g/dL (ref 12.0–15.0)
Lymphocytes Relative: 25 %
Lymphs Abs: 1.5 10*3/uL (ref 0.7–4.0)
MCH: 30.5 pg (ref 26.0–34.0)
MCHC: 32.8 g/dL (ref 30.0–36.0)
MCV: 92.8 fL (ref 78.0–100.0)
Monocytes Absolute: 0.6 10*3/uL (ref 0.1–1.0)
Monocytes Relative: 9 %
Neutro Abs: 3.8 10*3/uL (ref 1.7–7.7)
Neutrophils Relative %: 62 %
Platelets: 246 10*3/uL (ref 150–400)
RBC: 4.43 MIL/uL (ref 3.87–5.11)
RDW: 13.7 % (ref 11.5–15.5)
WBC: 6.1 10*3/uL (ref 4.0–10.5)

## 2016-01-24 LAB — BASIC METABOLIC PANEL
Anion gap: 9 (ref 5–15)
BUN: 19 mg/dL (ref 6–20)
CO2: 26 mmol/L (ref 22–32)
Calcium: 9.9 mg/dL (ref 8.9–10.3)
Chloride: 107 mmol/L (ref 101–111)
Creatinine, Ser: 0.56 mg/dL (ref 0.44–1.00)
GFR calc Af Amer: >60 mL/min (ref >60)
GFR calc non Af Amer: >60 mL/min (ref >60)
Glucose, Bld: 98 mg/dL (ref 65–99)
Potassium: 4.1 mmol/L (ref 3.5–5.1)
Sodium: 142 mmol/L (ref 135–145)

## 2016-01-24 LAB — SURGICAL PCR SCREEN
MRSA, PCR: NEGATIVE
Staphylococcus aureus: NEGATIVE

## 2016-01-24 LAB — URINALYSIS, ROUTINE W REFLEX MICROSCOPIC
Bilirubin Urine: NEGATIVE
Glucose, UA: NEGATIVE mg/dL
Hgb urine dipstick: NEGATIVE
Ketones, ur: NEGATIVE mg/dL
Leukocytes, UA: NEGATIVE
Nitrite: NEGATIVE
Protein, ur: NEGATIVE mg/dL
Specific Gravity, Urine: 1.028 (ref 1.005–1.030)
pH: 5 (ref 5.0–8.0)

## 2016-01-24 LAB — PROTIME-INR
INR: 0.98 (ref 0.00–1.49)
Prothrombin Time: 13.2 seconds (ref 11.6–15.2)

## 2016-01-24 LAB — APTT: aPTT: 28 seconds (ref 24–37)

## 2016-01-24 LAB — ABO/RH: ABO/RH(D): B POS

## 2016-01-24 MED ORDER — CHLORHEXIDINE GLUCONATE 4 % EX LIQD: 60.0000 mL | Freq: Once | CUTANEOUS | Status: DC

## 2016-01-24 NOTE — Telephone Encounter (Signed)
Ok to order BEST FIT eval per [facility] for POC if eligible

## 2016-01-24 NOTE — Progress Notes (Addendum)
Cardiologist is Dr.Kelly,last OV in epic from 08-28-15  Pulmonologist is Dr.Wert,last OV in epic from 11-22-15  Medical Md is Dr.Brent Tollie Pizza  Echo report in epic from 08-13-11  Stress test report in epic from 10-01-15  Heart cath  EKG in epic from 08-28-15  CXR denies in past yr

## 2016-01-24 NOTE — Telephone Encounter (Signed)
Order placed. Nothing further needed. 

## 2016-01-24 NOTE — Telephone Encounter (Signed)
Pt requesting a smaller, more portable 02 system.  Pt states she travels frequently and wants something that could fit on an airplane when she travels. Pt wears 2lpm, gets 02 through [facility].   MW ok to order a more portable poc?  Thanks!

## 2016-01-24 NOTE — Pre-Procedure Instructions (Signed)
Ahnyah Sabbath  01/24/2016      [facility] - [place], [place] - [address] [place] [address] Phone: [phone] Fax: [phone]    Your procedure is scheduled on Mon, Mar 6 @ 10:30 AM  Report to [facility] Admitting at 8:30 AM  Call this number if you have problems the morning of surgery:  [phone]   Remember:  Do not eat food or drink liquids after midnight.  Take these medicines the morning of surgery with A SIP OF WATER Astelin Nasal Spray(if needed),Allegra(Fexofenadine-if needed),Zantac(Ranitidine),and Synthroid(Levothyroxine)              Stop taking your Ibuprofen,Krill Oil,Mobic along with any other Vitamins or Herbal Medications. No Goody's,BC's,Aleve,Advil,or Motrin.    Do not wear jewelry, make-up or nail polish.  Do not wear lotions, powders, or perfumes.  You may wear deodorant.  Do not shave 48 hours prior to surgery.    Do not bring valuables to the hospital.  [facility] is not responsible for any belongings or valuables.  Contacts, dentures or bridgework may not be worn into surgery.  Leave your suitcase in the car.  After surgery it may be brought to your room.  For patients admitted to the hospital, discharge time will be determined by your treatment team.  Patients discharged the day of surgery will not be allowed to drive home.    Special instructions:  [facility] - Preparing for Surgery  Before surgery, you can play an important role.  Because skin is not sterile, your skin needs to be as free of germs as possible.  You can reduce the number of germs on you skin by washing with CHG (chlorahexidine gluconate) soap before surgery.  CHG is an antiseptic cleaner which kills germs and bonds with the skin to continue killing germs even after washing.  Please DO NOT use if you have an allergy to CHG or antibacterial soaps.  If your skin becomes reddened/irritated stop using the CHG and inform  your nurse when you arrive at Short Stay.  Do not shave (including legs and underarms) for at least 48 hours prior to the first CHG shower.  You may shave your face.  Please follow these instructions carefully:   1.  Shower with CHG Soap the night before surgery and the                                morning of Surgery.  2.  If you choose to wash your hair, wash your hair first as usual with your       normal shampoo.  3.  After you shampoo, rinse your hair and body thoroughly to remove the                      Shampoo.  4.  Use CHG as you would any other liquid soap.  You can apply chg directly       to the skin and wash gently with scrungie or a clean washcloth.  5.  Apply the CHG Soap to your body ONLY FROM THE NECK DOWN.        Do not use on open wounds or open sores.  Avoid contact with your eyes,       ears, mouth and genitals (private parts).  Wash genitals (private parts)       with  your normal soap.  6.  Wash thoroughly, paying special attention to the area where your surgery        will be performed.  7.  Thoroughly rinse your body with warm water from the neck down.  8.  DO NOT shower/wash with your normal soap after using and rinsing off       the CHG Soap.  9.  Pat yourself dry with a clean towel.            10.  Wear clean pajamas.            11.  Place clean sheets on your bed the night of your first shower and do not        sleep with pets.  Day of Surgery  Do not apply any lotions/deoderants the morning of surgery.  Please wear clean clothes to the hospital/surgery center.    Please read over the following fact sheets that you were given. Pain Booklet, Coughing and Deep Breathing, Blood Transfusion Information, MRSA Information and Surgical Site Infection Prevention

## 2016-01-27 NOTE — Progress Notes (Signed)
Anesthesia Chart Review:  Pt is a 70 year old female scheduled for L total knee revision on 02/03/2016 with Dr. Mayer Camel.   PCP is Dr. Juanita Craver. Pulmonologist is Dr. Christinia Gully. Cardiologist is Dr. Shelva Majestic.   PMH includes:  OSA, chronic respiratory failure (uses O2), hyperlipidemia, hypothyroidism, endometrial cancer, GERD, post-op N/V. Never smoker. BMI 27. S/p excision B back masses 10/05/14. S/p total hysterectomy 12/20/12.   Medications include: zantac, synthroid.   Preoperative labs reviewed.    Chest x-ray 01/24/16 reviewed. Slight interval growth in the faintly calcified nodule in the right lower lobe. Given this subtle change over the past 3 years, chest CT scanning is recommended.  EKG 08/28/15: NSR.   Exercise tolerance test 10/01/15: ETT with fair exercise tolerance (7:00); no chest pain; normal BP response; no ST changes; negative adequate ETT.  Echo 08/13/11:  - Mild concentric LVH. LV systolic function normal. EF >55%. Impaired LV relaxation.  - LA size normal.  - Mild mitral annular calcification.  - No significant valvular disease.   Left Juliann Pulse in Dr. Damita Dunnings office a voicemail about the need for chest CT due to change in pulmonary nodule.   If no changes, I anticipate pt can proceed with surgery as scheduled.   Willeen Cass, FNP-BC [facility]/Anesthesiology Phone: [phone] 01/27/2016 2:49 PM

## 2016-01-29 NOTE — H&P (Signed)
TOTAL KNEE REVISION ADMISSION H&P  Patient is being admitted for left revision total knee arthroplasty.  Subjective:  Chief Complaint:left knee pain.  HPI: Brooke Williams, 70 y.o. female, has a history of pain and functional disability in the left knee(s) due to arthritis and failed previous arthroplasty and patient has failed non-surgical conservative treatments for greater than 12 weeks to include NSAID's and/or analgesics, flexibility and strengthening excercises, use of assistive devices, weight reduction as appropriate and activity modification. The indications for the revision of the total knee arthroplasty are loosening of one or more components. Onset of symptoms was gradual starting 2 years ago with gradually worsening course since that time.  Prior procedures on the left knee(s) include unicompartmental arthroplasty.  Patient currently rates pain in the left knee(s) at 10 out of 10 with activity. There is night pain, worsening of pain with activity and weight bearing, pain that interferes with activities of daily living and pain with passive range of motion.  Patient has evidence of prosthetic loosening by imaging studies. This condition presents safety issues increasing the risk of falls.   There is no current active infection.  Patient Active Problem List   Diagnosis Date Noted  . Chronic respiratory failure with hypoxia (HCC)/ noct hypoxemia 11/21/2015  . OSA (obstructive sleep apnea) 10/16/2015  . Chronic tension-type headache, not intractable 10/16/2015  . Atypical chest pain 08/28/2015  . Vasomotor flushing 07/05/2014  . Hyperlipidemia 10/10/2013  . Endometrial ca ([place]) 10/10/2013  . Family history of premature CAD 10/10/2013  . Endometrial adenocarcinoma ([place]) 12/01/2012  . HEMORRHOIDS, INTERNAL 09/11/2010  . Migraine headache 03/19/2010  . ESOPHAGEAL REFLUX 03/19/2010  . SLE 03/19/2010  . FULL INCONTINENCE OF FECES 03/19/2010   Past Medical History  Diagnosis Date  .  PONV (postoperative nausea and vomiting)   . Seasonal allergies     takes Allegra daily and uses Astelin Nasal Spray as needed  . GERD (gastroesophageal reflux disease)     controls with Zantac  . Arthritis     arthritis-hands. cervical disc degeneration  . Endometrial cancer ([place]) 2014  . Restless leg syndrome     takes Mirapex nightly  . Hypothyroidism     takes Synthroid daily  . Hyperlipidemia     takes krill oil daily  . Pneumonia     hx of-within the last 15 yrs  . Headache(784.0)     headaches frequently but states since placed on 2L/m via N/C headaches have improved  . Dizziness     rarely,takes Meclizine if needed  . Lupus ([place])     remission since 2008  . Joint pain   . Joint swelling   . Back pain     6th vertebrae has collapse on 7th,pinched nerve feeling occ  . Ileus ([place]) 10/2015    was treated  . History of colon polyps     benign  . Nocturia   . OSA (obstructive sleep apnea)     sleep study in epic from 11-01-15.Uses Oxygen  . History of staph infection     Past Surgical History  Procedure Laterality Date  . Medial partial knee replacement  2005    LT knee  . Other surgical history      metal pin/screw to repair arthritis in LT thumb joint  . Hand surgery  2006    left thumb surgery with pin  . Dilation and curettage of uterus      2008-polyp removed  . Robotic assisted total hysterectomy with bilateral salpingo  oopherectomy  12/20/2012    Procedure: ROBOTIC ASSISTED TOTAL HYSTERECTOMY WITH BILATERAL SALPINGO OOPHORECTOMY;  Surgeon: Imagene Gurney A. Alycia Rossetti, MD;  Location: [facility];  Service: Gynecology;  Laterality: N/A;  WITH PELVIC PARAAORTIC   . Robotic pelvic and para-aortic lymph node dissection  12/20/2012    Procedure: ROBOTIC PELVIC AND PARA-AORTIC LYMPH NODE DISSECTION;  Surgeon: Imagene Gurney A. Alycia Rossetti, MD;  Location: [facility];  Service: Gynecology;  Laterality: Bilateral;  . Colonoscopy    . Mass excision N/A 10/05/2014    Procedure: EXCISION OF BILATERAL LOWER  BACK MASSES;  Surgeon: Coralie Keens, MD;  Location: [facility];  Service: General;  Laterality: N/A;  . Lipomas removed from back   40 yrs ago  . Abdominal hysterectomy      No prescriptions prior to admission   Allergies  Allergen Reactions  . Codeine Itching    Facial flushing  . Penicillins Hives    Has patient had a PCN reaction causing immediate rash, facial/tongue/throat swelling, SOB or lightheadedness with hypotension: No pt states she mostly had hives  Has patient had a PCN reaction causing severe rash involving mucus membranes or skin necrosis: No Has patient had a PCN reaction that required hospitalization: No Has patient had a PCN reaction occurring within the last 10 years: Yes If all of the above answers are "NO", then may proceed with Cephalosporin use.     . Cefdinir Hives, Itching and Rash  . Tape Itching and Rash    Skin turns red    Social History  Substance Use Topics  . Smoking status: Never Smoker   . Smokeless tobacco: Not on file  . Alcohol Use: Yes     Comment: occassional    Family History  Problem Relation Age of Onset  . Stroke Mother 67    arterial wall of artery in eye   . Endometrial cancer Mother 79  . Dementia Father   . Stroke Father     "lots of many strokes"  . Aneurysm Father 15    AAA  . Multiple births Maternal Grandmother     died 5 days after delivery of unknown causes  . Colon cancer Maternal Aunt     late 54s  . Cancer Cousin     maternal cousin with an unknown form of cancer      Review of Systems  Constitutional: Positive for malaise/fatigue.  HENT: Positive for tinnitus.   Eyes: Negative.   Respiratory: Negative.   Cardiovascular: Negative.   Gastrointestinal: Negative.   Genitourinary:       Hot flashes  Musculoskeletal:       Foot ulcers  Skin: Negative.   Neurological: Positive for headaches.  Endo/Heme/Allergies: Bruises/bleeds easily.  Psychiatric/Behavioral: The patient has insomnia.       Objective:  Physical Exam  Constitutional: She is oriented to person, place, and time. She appears well-developed and well-nourished.  HENT:  Head: Normocephalic and atraumatic.  Eyes: Pupils are equal, round, and reactive to light.  Neck: Normal range of motion. Neck supple.  Cardiovascular: Intact distal pulses.   Respiratory: Effort normal.  Musculoskeletal: She exhibits tenderness.  No palpable effusion, collateral ligaments are stable range of motion is 3 -115.  Neurovascularly intact.  Her skin is intact.  Neurological: She is alert and oriented to person, place, and time.  Skin: Skin is warm and dry.  Psychiatric: She has a normal mood and affect. Her behavior is normal. Judgment and thought content normal.    Vital signs  in last 24 hours:    Labs:  Estimated body mass index is 27.65 kg/(m^2) as calculated from the following:   Height as of 11/21/15: 5\' 7"  (1.702 m).   Weight as of 11/21/15: 80.105 kg (176 lb 9.6 oz).  Imaging Review Plain radiographs demonstrate bilateral AP weightbearing, bilateral Rosenberg, lateral sunrise views of bilateral knees are taken and reviewed in office today.  Patient's left knee does have a partial knee replacement.  She also has moderate patellofemoral arthritis.  Lucency seen underneath the tibial component of her unicondylar knee y  Patient's right knee does have mild new compartment arthritis and mild patellofemoral arthritis.  Assessment/Plan:  End stage arthritis, left knee(s) with failed previous arthroplasty. Loose left unicondylar knee replacement done in [place] 10 years ago  The patient history, physical examination, clinical judgment of the provider and imaging studies are consistent with end stage degenerative joint disease of the left knee(s), previous total knee arthroplasty. Revision total knee arthroplasty is deemed medically necessary. The treatment options including medical management, injection therapy,  arthroscopy and revision arthroplasty were discussed at length. The risks and benefits of revision total knee arthroplasty were presented and reviewed. The risks due to aseptic loosening, infection, stiffness, patella tracking problems, thromboembolic complications and other imponderables were discussed. The patient acknowledged the explanation, agreed to proceed with the plan and consent was signed. Patient is being admitted for inpatient treatment for surgery, pain control, PT, OT, prophylactic antibiotics, VTE prophylaxis, progressive ambulation and ADL's and discharge planning.The patient is planning to be discharged home with home health services

## 2016-01-31 MED ORDER — VANCOMYCIN HCL IN DEXTROSE 1-5 GM/200ML-% IV SOLN
1000.0000 mg | INTRAVENOUS | Status: AC
  Administered 2016-02-03 (×2): 1000 mg via INTRAVENOUS
  Filled 2016-01-31: qty 200

## 2016-01-31 MED ORDER — DEXTROSE-NACL 5-0.45 % IV SOLN: INTRAVENOUS | Status: DC

## 2016-02-01 DIAGNOSIS — T84038A Mechanical loosening of other internal prosthetic joint, initial encounter: Secondary | ICD-10-CM

## 2016-02-01 DIAGNOSIS — Z96659 Presence of unspecified artificial knee joint: Secondary | ICD-10-CM

## 2016-02-02 MED ORDER — BUPIVACAINE LIPOSOME 1.3 % IJ SUSP
20.0000 mL | Freq: Once | INTRAMUSCULAR | Status: AC
  Administered 2016-02-03: 20 mL
  Filled 2016-02-02: qty 20

## 2016-02-02 MED ORDER — TRANEXAMIC ACID 1000 MG/10ML IV SOLN
1000.0000 mg | INTRAVENOUS | Status: AC
  Administered 2016-02-03: 1000 mg via INTRAVENOUS
  Filled 2016-02-02: qty 10

## 2016-02-03 ENCOUNTER — Encounter (HOSPITAL_COMMUNITY)
Admission: RE | Disposition: A | Discharge status: Home Health | Payer: Self-pay | Source: Ambulatory Visit | Attending: Orthopedic Surgery

## 2016-02-03 ENCOUNTER — Encounter (HOSPITAL_COMMUNITY): Payer: Self-pay | Admitting: Anesthesiology

## 2016-02-03 ENCOUNTER — Inpatient Hospital Stay (HOSPITAL_COMMUNITY): Payer: Medicare Other | Admitting: Anesthesiology

## 2016-02-03 ENCOUNTER — Inpatient Hospital Stay (HOSPITAL_COMMUNITY): Payer: Medicare Other | Admitting: Emergency Medicine

## 2016-02-03 ENCOUNTER — Inpatient Hospital Stay (HOSPITAL_COMMUNITY)
Admission: RE | Admit: 2016-02-03 | Discharge: 2016-02-05 | DRG: 467 | Disposition: A | Discharge status: Home Health | Payer: Medicare Other | Source: Ambulatory Visit | Attending: Orthopedic Surgery | Admitting: Orthopedic Surgery

## 2016-02-03 DIAGNOSIS — Y792 Prosthetic and other implants, materials and accessory orthopedic devices associated with adverse incidents: Secondary | ICD-10-CM | POA: Diagnosis present

## 2016-02-03 DIAGNOSIS — Z880 Allergy status to penicillin: Secondary | ICD-10-CM

## 2016-02-03 DIAGNOSIS — M329 Systemic lupus erythematosus, unspecified: Secondary | ICD-10-CM | POA: Diagnosis present

## 2016-02-03 DIAGNOSIS — E039 Hypothyroidism, unspecified: Secondary | ICD-10-CM | POA: Diagnosis present

## 2016-02-03 DIAGNOSIS — G2581 Restless legs syndrome: Secondary | ICD-10-CM | POA: Diagnosis present

## 2016-02-03 DIAGNOSIS — Z886 Allergy status to analgesic agent status: Secondary | ICD-10-CM | POA: Diagnosis not present

## 2016-02-03 DIAGNOSIS — Z8542 Personal history of malignant neoplasm of other parts of uterus: Secondary | ICD-10-CM

## 2016-02-03 DIAGNOSIS — G4733 Obstructive sleep apnea (adult) (pediatric): Secondary | ICD-10-CM | POA: Diagnosis present

## 2016-02-03 DIAGNOSIS — M25562 Pain in left knee: Secondary | ICD-10-CM | POA: Diagnosis present

## 2016-02-03 DIAGNOSIS — I739 Peripheral vascular disease, unspecified: Secondary | ICD-10-CM | POA: Diagnosis present

## 2016-02-03 DIAGNOSIS — T84038A Mechanical loosening of other internal prosthetic joint, initial encounter: Secondary | ICD-10-CM

## 2016-02-03 DIAGNOSIS — D62 Acute posthemorrhagic anemia: Secondary | ICD-10-CM | POA: Diagnosis not present

## 2016-02-03 DIAGNOSIS — K219 Gastro-esophageal reflux disease without esophagitis: Secondary | ICD-10-CM | POA: Diagnosis present

## 2016-02-03 DIAGNOSIS — Z96659 Presence of unspecified artificial knee joint: Secondary | ICD-10-CM

## 2016-02-03 DIAGNOSIS — J9611 Chronic respiratory failure with hypoxia: Secondary | ICD-10-CM | POA: Diagnosis present

## 2016-02-03 DIAGNOSIS — E785 Hyperlipidemia, unspecified: Secondary | ICD-10-CM | POA: Diagnosis present

## 2016-02-03 DIAGNOSIS — T84033A Mechanical loosening of internal left knee prosthetic joint, initial encounter: Secondary | ICD-10-CM | POA: Diagnosis present

## 2016-02-03 HISTORY — PX: TOTAL KNEE REVISION: SHX996

## 2016-02-03 SURGERY — TOTAL KNEE REVISION
Anesthesia: Spinal | Laterality: Left

## 2016-02-03 MED ORDER — MENTHOL 3 MG MT LOZG: 1.0000 | LOZENGE | OROMUCOSAL | Status: DC | PRN

## 2016-02-03 MED ORDER — SODIUM CHLORIDE 0.9 % IV SOLN
10.0000 mg | INTRAVENOUS | Status: DC | PRN
  Administered 2016-02-03: 25 ug/min via INTRAVENOUS

## 2016-02-03 MED ORDER — METOCLOPRAMIDE HCL 5 MG/ML IJ SOLN: 5.0000 mg | Freq: Three times a day (TID) | INTRAMUSCULAR | Status: DC | PRN

## 2016-02-03 MED ORDER — TRANEXAMIC ACID 1000 MG/10ML IV SOLN
2000.0000 mg | Freq: Once | INTRAVENOUS | Status: DC
  Filled 2016-02-03: qty 20

## 2016-02-03 MED ORDER — DIPHENHYDRAMINE HCL 12.5 MG/5ML PO ELIX
12.5000 mg | ORAL_SOLUTION | ORAL | Status: DC | PRN
  Filled 2016-02-03: qty 10

## 2016-02-03 MED ORDER — LACTATED RINGERS IV SOLN
INTRAVENOUS | Status: DC
  Administered 2016-02-03: 09:00:00 via INTRAVENOUS

## 2016-02-03 MED ORDER — PRAMIPEXOLE DIHYDROCHLORIDE 0.25 MG PO TABS
0.2500 mg | ORAL_TABLET | Freq: Every day | ORAL | Status: DC
  Administered 2016-02-03: 0.25 mg via ORAL
  Filled 2016-02-03 (×2): qty 1

## 2016-02-03 MED ORDER — TRANEXAMIC ACID 1000 MG/10ML IV SOLN
2000.0000 mg | INTRAVENOUS | Status: DC | PRN
  Administered 2016-02-03: 2000 mg via TOPICAL

## 2016-02-03 MED ORDER — FENTANYL CITRATE (PF) 100 MCG/2ML IJ SOLN
INTRAMUSCULAR | Status: DC | PRN
  Administered 2016-02-03: 50 ug via INTRAVENOUS

## 2016-02-03 MED ORDER — AZELASTINE HCL 0.1 % NA SOLN: 1.0000 | Freq: Every day | NASAL | Status: DC | PRN

## 2016-02-03 MED ORDER — TIZANIDINE HCL 2 MG PO CAPS: 2.0000 mg | ORAL_CAPSULE | Freq: Three times a day (TID) | ORAL | Status: DC

## 2016-02-03 MED ORDER — BISACODYL 5 MG PO TBEC: 5.0000 mg | DELAYED_RELEASE_TABLET | Freq: Every day | ORAL | Status: DC | PRN

## 2016-02-03 MED ORDER — MIDAZOLAM HCL 5 MG/5ML IJ SOLN
INTRAMUSCULAR | Status: DC | PRN
  Administered 2016-02-03: 2 mg via INTRAVENOUS

## 2016-02-03 MED ORDER — HYDROMORPHONE HCL 1 MG/ML IJ SOLN
INTRAMUSCULAR | Status: AC
  Filled 2016-02-03: qty 1

## 2016-02-03 MED ORDER — ACETAMINOPHEN 650 MG RE SUPP: 650.0000 mg | Freq: Four times a day (QID) | RECTAL | Status: DC | PRN

## 2016-02-03 MED ORDER — ALPHA LIPOIC ACID 200 MG PO CAPS: 600.0000 mg | ORAL_CAPSULE | Freq: Every day | ORAL | Status: DC

## 2016-02-03 MED ORDER — METHOCARBAMOL 1000 MG/10ML IJ SOLN
500.0000 mg | Freq: Four times a day (QID) | INTRAVENOUS | Status: DC | PRN
  Filled 2016-02-03: qty 5

## 2016-02-03 MED ORDER — KCL IN DEXTROSE-NACL 20-5-0.45 MEQ/L-%-% IV SOLN
INTRAVENOUS | Status: DC
  Administered 2016-02-03: 21:00:00 via INTRAVENOUS
  Filled 2016-02-03 (×2): qty 1000

## 2016-02-03 MED ORDER — CEFUROXIME SODIUM 1.5 G IJ SOLR
INTRAMUSCULAR | Status: AC
  Filled 2016-02-03: qty 1.5

## 2016-02-03 MED ORDER — TRANEXAMIC ACID 1000 MG/10ML IV SOLN
1000.0000 mg | Freq: Once | INTRAVENOUS | Status: AC
  Administered 2016-02-03: 1000 mg via INTRAVENOUS
  Filled 2016-02-03: qty 10

## 2016-02-03 MED ORDER — SODIUM CHLORIDE 0.9 % IJ SOLN
INTRAMUSCULAR | Status: DC | PRN
  Administered 2016-02-03: 40 mL via INTRAVENOUS

## 2016-02-03 MED ORDER — ASPIRIN EC 325 MG PO TBEC: 325.0000 mg | DELAYED_RELEASE_TABLET | Freq: Two times a day (BID) | ORAL | Status: DC

## 2016-02-03 MED ORDER — SENNOSIDES-DOCUSATE SODIUM 8.6-50 MG PO TABS: 1.0000 | ORAL_TABLET | Freq: Every evening | ORAL | Status: DC | PRN

## 2016-02-03 MED ORDER — HYDROMORPHONE HCL 1 MG/ML IJ SOLN
0.2500 mg | INTRAMUSCULAR | Status: DC | PRN
  Administered 2016-02-03: 0.5 mg via INTRAVENOUS

## 2016-02-03 MED ORDER — DOCUSATE SODIUM 100 MG PO CAPS
100.0000 mg | ORAL_CAPSULE | Freq: Two times a day (BID) | ORAL | Status: DC
  Administered 2016-02-05: 100 mg via ORAL
  Filled 2016-02-03 (×2): qty 1

## 2016-02-03 MED ORDER — ACETAMINOPHEN 325 MG PO TABS
650.0000 mg | ORAL_TABLET | Freq: Four times a day (QID) | ORAL | Status: DC | PRN
  Administered 2016-02-03: 650 mg via ORAL
  Filled 2016-02-03: qty 2

## 2016-02-03 MED ORDER — MIDAZOLAM HCL 2 MG/2ML IJ SOLN
INTRAMUSCULAR | Status: AC
  Filled 2016-02-03: qty 2

## 2016-02-03 MED ORDER — ASPIRIN EC 325 MG PO TBEC
325.0000 mg | DELAYED_RELEASE_TABLET | Freq: Every day | ORAL | Status: DC
  Administered 2016-02-04 – 2016-02-05 (×2): 325 mg via ORAL
  Filled 2016-02-03 (×2): qty 1

## 2016-02-03 MED ORDER — MEPERIDINE HCL 25 MG/ML IJ SOLN: 6.2500 mg | INTRAMUSCULAR | Status: DC | PRN

## 2016-02-03 MED ORDER — LIDOCAINE HCL (CARDIAC) 20 MG/ML IV SOLN
INTRAVENOUS | Status: AC
  Filled 2016-02-03: qty 5

## 2016-02-03 MED ORDER — ALUM & MAG HYDROXIDE-SIMETH 200-200-20 MG/5ML PO SUSP: 30.0000 mL | ORAL | Status: DC | PRN

## 2016-02-03 MED ORDER — SODIUM CHLORIDE 0.9 % IR SOLN
Status: DC | PRN
Start: 2016-02-03 — End: 2016-02-03
  Administered 2016-02-03: 1

## 2016-02-03 MED ORDER — METOCLOPRAMIDE HCL 5 MG PO TABS: 5.0000 mg | ORAL_TABLET | Freq: Three times a day (TID) | ORAL | Status: DC | PRN

## 2016-02-03 MED ORDER — ONDANSETRON HCL 4 MG PO TABS: 4.0000 mg | ORAL_TABLET | Freq: Four times a day (QID) | ORAL | Status: DC | PRN

## 2016-02-03 MED ORDER — PHENOL 1.4 % MT LIQD: 1.0000 | OROMUCOSAL | Status: DC | PRN

## 2016-02-03 MED ORDER — LEVOTHYROXINE SODIUM 75 MCG PO TABS
75.0000 ug | ORAL_TABLET | Freq: Every day | ORAL | Status: DC
  Administered 2016-02-04 – 2016-02-05 (×2): 75 ug via ORAL
  Filled 2016-02-03 (×2): qty 1

## 2016-02-03 MED ORDER — FAMOTIDINE 20 MG PO TABS
20.0000 mg | ORAL_TABLET | Freq: Two times a day (BID) | ORAL | Status: DC
  Administered 2016-02-04 – 2016-02-05 (×2): 20 mg via ORAL
  Filled 2016-02-03 (×2): qty 1

## 2016-02-03 MED ORDER — OXYCODONE HCL 5 MG PO TABS
5.0000 mg | ORAL_TABLET | ORAL | Status: DC | PRN
  Administered 2016-02-03 – 2016-02-04 (×6): 10 mg via ORAL
  Filled 2016-02-03 (×5): qty 2

## 2016-02-03 MED ORDER — FLEET ENEMA 7-19 GM/118ML RE ENEM: 1.0000 | ENEMA | Freq: Once | RECTAL | Status: DC | PRN

## 2016-02-03 MED ORDER — TRIAMCINOLONE ACETONIDE 0.1 % EX CREA: 1.0000 "application " | TOPICAL_CREAM | Freq: Every day | CUTANEOUS | Status: DC | PRN

## 2016-02-03 MED ORDER — ONDANSETRON HCL 4 MG/2ML IJ SOLN
4.0000 mg | Freq: Four times a day (QID) | INTRAMUSCULAR | Status: DC | PRN
  Administered 2016-02-03: 4 mg via INTRAVENOUS
  Filled 2016-02-03: qty 2

## 2016-02-03 MED ORDER — METHOCARBAMOL 500 MG PO TABS
500.0000 mg | ORAL_TABLET | Freq: Four times a day (QID) | ORAL | Status: DC | PRN
  Administered 2016-02-03 – 2016-02-04 (×2): 500 mg via ORAL
  Filled 2016-02-03 (×2): qty 1

## 2016-02-03 MED ORDER — ACETAMINOPHEN 325 MG PO TABS
ORAL_TABLET | ORAL | Status: AC
  Administered 2016-02-03: 650 mg via ORAL
  Filled 2016-02-03: qty 2

## 2016-02-03 MED ORDER — OXYCODONE HCL 5 MG PO TABS
ORAL_TABLET | ORAL | Status: AC
  Administered 2016-02-03: 10 mg via ORAL
  Filled 2016-02-03: qty 2

## 2016-02-03 MED ORDER — ONDANSETRON HCL 4 MG/2ML IJ SOLN
INTRAMUSCULAR | Status: DC | PRN
  Administered 2016-02-03: 4 mg via INTRAVENOUS

## 2016-02-03 MED ORDER — IBUPROFEN 800 MG PO TABS
800.0000 mg | ORAL_TABLET | Freq: Three times a day (TID) | ORAL | Status: DC | PRN
  Administered 2016-02-04 – 2016-02-05 (×2): 800 mg via ORAL
  Filled 2016-02-03 (×2): qty 1
  Filled 2016-02-03 (×2): qty 4
  Filled 2016-02-03: qty 1

## 2016-02-03 MED ORDER — DEXAMETHASONE SODIUM PHOSPHATE 4 MG/ML IJ SOLN
INTRAMUSCULAR | Status: DC | PRN
  Administered 2016-02-03: 4 mg via INTRAVENOUS

## 2016-02-03 MED ORDER — ONDANSETRON HCL 4 MG/2ML IJ SOLN
INTRAMUSCULAR | Status: AC
  Filled 2016-02-03: qty 2

## 2016-02-03 MED ORDER — OXYCODONE-ACETAMINOPHEN 5-325 MG PO TABS: 1.0000 | ORAL_TABLET | ORAL | Status: DC | PRN

## 2016-02-03 MED ORDER — PROPOFOL 10 MG/ML IV BOLUS
INTRAVENOUS | Status: DC | PRN
  Administered 2016-02-03: 20 mg via INTRAVENOUS

## 2016-02-03 MED ORDER — DEXAMETHASONE SODIUM PHOSPHATE 4 MG/ML IJ SOLN
INTRAMUSCULAR | Status: AC
  Filled 2016-02-03: qty 1

## 2016-02-03 MED ORDER — PROPOFOL 10 MG/ML IV BOLUS
INTRAVENOUS | Status: AC
  Filled 2016-02-03: qty 20

## 2016-02-03 MED ORDER — SODIUM CHLORIDE 0.9 % IJ SOLN
INTRAMUSCULAR | Status: AC
  Filled 2016-02-03: qty 40

## 2016-02-03 MED ORDER — HYDROMORPHONE HCL 1 MG/ML IJ SOLN: 0.5000 mg | INTRAMUSCULAR | Status: DC | PRN

## 2016-02-03 MED ORDER — ONDANSETRON HCL 4 MG/2ML IJ SOLN: 4.0000 mg | Freq: Once | INTRAMUSCULAR | Status: DC | PRN

## 2016-02-03 MED ORDER — FENTANYL CITRATE (PF) 250 MCG/5ML IJ SOLN
INTRAMUSCULAR | Status: AC
  Filled 2016-02-03: qty 5

## 2016-02-03 MED ORDER — PSEUDOEPHEDRINE HCL ER 120 MG PO TB12: 120.0000 mg | ORAL_TABLET | Freq: Every day | ORAL | Status: DC | PRN

## 2016-02-03 MED ORDER — PROPOFOL 500 MG/50ML IV EMUL
INTRAVENOUS | Status: DC | PRN
  Administered 2016-02-03: 50 ug/kg/min via INTRAVENOUS

## 2016-02-03 MED ORDER — LORATADINE 10 MG PO TABS
10.0000 mg | ORAL_TABLET | Freq: Every day | ORAL | Status: DC
  Administered 2016-02-04 – 2016-02-05 (×2): 10 mg via ORAL
  Filled 2016-02-03 (×2): qty 1

## 2016-02-03 SURGICAL SUPPLY — 74 items
BAG DECANTER FOR FLEXI CONT (MISCELLANEOUS) ×3 IMPLANT
BANDAGE ACE 4X5 VEL STRL LF (GAUZE/BANDAGES/DRESSINGS) ×3 IMPLANT
BANDAGE ACE 6X5 VEL STRL LF (GAUZE/BANDAGES/DRESSINGS) ×3 IMPLANT
BANDAGE ELASTIC 4 VELCRO ST LF (GAUZE/BANDAGES/DRESSINGS) ×3 IMPLANT
BANDAGE ELASTIC 6 VELCRO ST LF (GAUZE/BANDAGES/DRESSINGS) ×3 IMPLANT
BANDAGE ESMARK 6X9 LF (GAUZE/BANDAGES/DRESSINGS) ×1 IMPLANT
BLADE SAG 18X100X1.27 (BLADE) ×3 IMPLANT
BLADE SAW SAG 90X13X1.27 (BLADE) ×3 IMPLANT
BLADE SURG ROTATE 9660 (MISCELLANEOUS) IMPLANT
BNDG ESMARK 6X9 LF (GAUZE/BANDAGES/DRESSINGS) ×3
BOWL SMART MIX CTS (DISPOSABLE) ×3 IMPLANT
CAPT KNEE TOTAL 3 ATTUNE ×3 IMPLANT
CEMENT HV SMART SET (Cement) ×6 IMPLANT
COVER BACK TABLE 24X17X13 BIG (DRAPES) IMPLANT
COVER SURGICAL LIGHT HANDLE (MISCELLANEOUS) ×3 IMPLANT
CUFF TOURNIQUET SINGLE 34IN LL (TOURNIQUET CUFF) ×3 IMPLANT
CUFF TOURNIQUET SINGLE 44IN (TOURNIQUET CUFF) IMPLANT
DISC DIAMOND MED (BURR) IMPLANT
DRAPE EXTREMITY T 121X128X90 (DRAPE) ×3 IMPLANT
DRAPE IMP U-DRAPE 54X76 (DRAPES) ×3 IMPLANT
DRAPE U-SHAPE 47X51 STRL (DRAPES) ×3 IMPLANT
DRESSING AQUACEL AG SP 3.5X6 (GAUZE/BANDAGES/DRESSINGS) ×1 IMPLANT
DRSG AQUACEL AG SP 3.5X6 (GAUZE/BANDAGES/DRESSINGS) ×3
DURAPREP 26ML APPLICATOR (WOUND CARE) ×3 IMPLANT
ELECT CAUTERY BLADE 6.4 (BLADE) ×3 IMPLANT
ELECT REM PT RETURN 9FT ADLT (ELECTROSURGICAL) ×3
ELECTRODE REM PT RTRN 9FT ADLT (ELECTROSURGICAL) ×1 IMPLANT
EVACUATOR 1/8 PVC DRAIN (DRAIN) IMPLANT
GAUZE SPONGE 4X4 12PLY STRL (GAUZE/BANDAGES/DRESSINGS) ×6 IMPLANT
GAUZE XEROFORM 1X8 LF (GAUZE/BANDAGES/DRESSINGS) ×3 IMPLANT
GLOVE BIO SURGEON STRL SZ7.5 (GLOVE) ×3 IMPLANT
GLOVE BIO SURGEON STRL SZ8.5 (GLOVE) ×9 IMPLANT
GLOVE BIOGEL PI IND STRL 8 (GLOVE) ×2 IMPLANT
GLOVE BIOGEL PI IND STRL 9 (GLOVE) ×1 IMPLANT
GLOVE BIOGEL PI INDICATOR 8 (GLOVE) ×4
GLOVE BIOGEL PI INDICATOR 9 (GLOVE) ×2
GLOVE SURG SS PI 7.5 STRL IVOR (GLOVE) ×3 IMPLANT
GOWN STRL REUS W/ TWL LRG LVL3 (GOWN DISPOSABLE) ×1 IMPLANT
GOWN STRL REUS W/ TWL XL LVL3 (GOWN DISPOSABLE) ×3 IMPLANT
GOWN STRL REUS W/TWL LRG LVL3 (GOWN DISPOSABLE) ×2
GOWN STRL REUS W/TWL XL LVL3 (GOWN DISPOSABLE) ×6
HANDPIECE INTERPULSE COAX TIP (DISPOSABLE) ×2
HOOD PEEL AWAY FACE SHEILD DIS (HOOD) ×9 IMPLANT
KIT BASIN OR (CUSTOM PROCEDURE TRAY) ×3 IMPLANT
KIT ROOM TURNOVER OR (KITS) ×3 IMPLANT
MANIFOLD NEPTUNE II (INSTRUMENTS) ×3 IMPLANT
NEEDLE SPNL 18GX3.5 QUINCKE PK (NEEDLE) ×3 IMPLANT
NS IRRIG 1000ML POUR BTL (IV SOLUTION) ×3 IMPLANT
PACK TOTAL JOINT (CUSTOM PROCEDURE TRAY) ×3 IMPLANT
PACK UNIVERSAL I (CUSTOM PROCEDURE TRAY) ×3 IMPLANT
PAD ARMBOARD 7.5X6 YLW CONV (MISCELLANEOUS) ×6 IMPLANT
PAD CAST 4YDX4 CTTN HI CHSV (CAST SUPPLIES) ×1 IMPLANT
PADDING CAST COTTON 4X4 STRL (CAST SUPPLIES) ×2
PADDING CAST COTTON 6X4 STRL (CAST SUPPLIES) ×3 IMPLANT
RASP HELIOCORDIAL MED (MISCELLANEOUS) IMPLANT
SET HNDPC FAN SPRY TIP SCT (DISPOSABLE) ×1 IMPLANT
SPONGE GAUZE 4X4 12PLY STER LF (GAUZE/BANDAGES/DRESSINGS) ×3 IMPLANT
SPONGE LAP 18X18 X RAY DECT (DISPOSABLE) ×3 IMPLANT
STAPLER VISISTAT 35W (STAPLE) ×3 IMPLANT
SUCTION FRAZIER HANDLE 10FR (MISCELLANEOUS) ×2
SUCTION TUBE FRAZIER 10FR DISP (MISCELLANEOUS) ×1 IMPLANT
SUT VIC AB 0 CT1 27 (SUTURE) ×2
SUT VIC AB 0 CT1 27XBRD ANBCTR (SUTURE) ×1 IMPLANT
SUT VIC AB 1 CTX 36 (SUTURE) ×2
SUT VIC AB 1 CTX36XBRD ANBCTR (SUTURE) ×1 IMPLANT
SUT VIC AB 2-0 CT1 27 (SUTURE) ×2
SUT VIC AB 2-0 CT1 TAPERPNT 27 (SUTURE) ×1 IMPLANT
SUT VIC AB 3-0 X1 27 (SUTURE) ×3 IMPLANT
SYR 50ML LL SCALE MARK (SYRINGE) ×3 IMPLANT
TOWEL OR 17X24 6PK STRL BLUE (TOWEL DISPOSABLE) ×3 IMPLANT
TOWEL OR 17X26 10 PK STRL BLUE (TOWEL DISPOSABLE) ×3 IMPLANT
TRAY FOLEY CATH 14FR (SET/KITS/TRAYS/PACK) IMPLANT
TUBE ANAEROBIC SPECIMEN COL (MISCELLANEOUS) IMPLANT
WATER STERILE IRR 1000ML POUR (IV SOLUTION) ×9 IMPLANT

## 2016-02-03 NOTE — Transfer of Care (Signed)
Immediate Anesthesia Transfer of Care Note  Patient: Brooke Williams  Procedure(s) Performed: Procedure(s): TOTAL KNEE REVISION (Left)  Patient Location: PACU  Anesthesia Type:Spinal  Level of Consciousness: awake, alert  and oriented  Airway & Oxygen Therapy: Patient Spontanous Breathing and Patient connected to face mask oxygen  Post-op Assessment: Report given to RN and Post -op Vital signs reviewed and stable  Post vital signs: Reviewed and stable  Last Vitals:  Filed Vitals:   02/03/16 0846  BP: 127/81  Pulse: 75  Temp: 36.7 C  Resp: 16    Complications: No apparent anesthesia complications

## 2016-02-03 NOTE — Progress Notes (Signed)
Utilization review completed.  

## 2016-02-03 NOTE — Anesthesia Preprocedure Evaluation (Addendum)
Anesthesia Evaluation  Patient identified by MRN, date of birth, ID band Patient awake    Reviewed: Allergy & Precautions, NPO status , Patient's Chart, lab work & pertinent test results  History of Anesthesia Complications (+) PONV and history of anesthetic complications  Airway Mallampati: III  TM Distance: >3 FB Neck ROM: Full    Dental no notable dental hx. (+) Teeth Intact, Caps   Pulmonary sleep apnea , pneumonia, resolved,    Pulmonary exam normal breath sounds clear to auscultation       Cardiovascular + Peripheral Vascular Disease  negative cardio ROS Normal cardiovascular exam Rhythm:Regular Rate:Normal     Neuro/Psych  Headaches, Vasomotor flushing negative psych ROS   GI/Hepatic Neg liver ROS, GERD  Medicated and Controlled,Fecal incontinence   Endo/Other  Hypothyroidism Hyperlipidemia  Renal/GU negative Renal ROS  negative genitourinary   Musculoskeletal  (+) Arthritis , Loose unicompartment knee arthroplasty " pinched nerve C6-7 with occassional R arm radiculopathy"   Abdominal   Peds  Hematology SLE   Anesthesia Other Findings   Reproductive/Obstetrics Hx/o endometrial Ca                          Anesthesia Physical Anesthesia Plan  ASA: II  Anesthesia Plan: Spinal   Post-op Pain Management:    Induction:   Airway Management Planned: Natural Airway and Simple Face Mask  Additional Equipment:   Intra-op Plan:   Post-operative Plan:   Informed Consent: I have reviewed the patients History and Physical, chart, labs and discussed the procedure including the risks, benefits and alternatives for the proposed anesthesia with the patient or authorized representative who has indicated his/her understanding and acceptance.   Dental advisory given  Plan Discussed with: CRNA, Anesthesiologist and Surgeon  Anesthesia Plan Comments: (Position neck in neutral position)       Anesthesia Quick Evaluation

## 2016-02-03 NOTE — Interval H&P Note (Signed)
History and Physical Interval Note:  02/03/2016 9:06 AM  Brooke Williams  has presented today for surgery, with the diagnosis of LOOSE LEFT UNI KNEE  The various methods of treatment have been discussed with the patient and family. After consideration of risks, benefits and other options for treatment, the patient has consented to  Procedure(s): TOTAL KNEE REVISION (Left) as a surgical intervention .  The patient's history has been reviewed, patient examined, no change in status, stable for surgery.  I have reviewed the patient's chart and labs.  Questions were answered to the patient's satisfaction.     Kerin Salen

## 2016-02-03 NOTE — Discharge Instructions (Signed)

## 2016-02-03 NOTE — Progress Notes (Signed)
Orthopedic Tech Progress Note Patient Details:  Brooke Williams 1946/10/28 [id number]  CPM Left Knee CPM Left Knee: On Left Knee Flexion (Degrees): 40 Left Knee Extension (Degrees): 10 Additional Comments: Trapeze bar and foot roll   [name] 02/03/2016, 1:45 PM

## 2016-02-03 NOTE — Progress Notes (Signed)
Pt arrived to the unit via bed with IV intact transfusing from PACU. Pt LLE has clean, dry intact ace wrap with no stain or active bleeding noted; CPM ON with ice applied to the side. Pt voices decreased sensation to lower extremities including LLE but able to wrinkle toes. Pt oriented to the unit and room; fall and safety precautions/prevention education completed with pt; pt voided incontinent upon arrival to the unit; pt call light within reach and family at bedside. Will closely monitor. Francis Gaines Tresea Heine RN.

## 2016-02-03 NOTE — Progress Notes (Signed)
Orthopedic Tech Progress Note Patient Details:  Brooke Williams 03/24/1946 [id number]  CPM Left Knee CPM Left Knee: On Left Knee Flexion (Degrees): 40 Left Knee Extension (Degrees): 10 Additional Comments: Trapeze bar and foot roll   [name] 02/03/2016, 4:54 PM

## 2016-02-03 NOTE — Progress Notes (Signed)
Orthopedic Tech Progress Note Patient Details:  Brooke Williams Jul 15, 1946 [id number]  Patient ID: Brooke Williams, female   DOB: Jun 04, 1946, 70 y.o.   MRN: [id number] Applied cpm 0-40  Karolee Stamps 02/03/2016, 7:38 PM

## 2016-02-03 NOTE — Anesthesia Postprocedure Evaluation (Addendum)
Anesthesia Post Note  Patient: Brooke Williams  Procedure(s) Performed: Procedure(s) (LRB): TOTAL KNEE REVISION (Left)  Patient location during evaluation: PACU Anesthesia Type: Spinal Level of consciousness: awake and alert and oriented Pain management: pain level controlled Vital Signs Assessment: post-procedure vital signs reviewed and stable Respiratory status: spontaneous breathing, nonlabored ventilation, respiratory function stable and patient connected to nasal cannula oxygen Cardiovascular status: blood pressure returned to baseline and stable Postop Assessment: no signs of nausea or vomiting, no headache, no backache, spinal receding and patient able to bend at knees Anesthetic complications: no    Last Vitals:  Filed Vitals:   02/03/16 1317 02/03/16 1345  BP: 119/71   Pulse: 62 66  Temp:    Resp: 17 17    Last Pain:  Filed Vitals:   02/03/16 1355  PainSc: 2                  Toby Breithaupt A.

## 2016-02-03 NOTE — Anesthesia Procedure Notes (Signed)
Procedure Name: MAC Date/Time: 02/03/2016 10:29 AM Performed by: Kyung Rudd Pre-anesthesia Checklist: Patient identified, Emergency Drugs available, Suction available, Patient being monitored and Timeout performed Patient Re-evaluated:Patient Re-evaluated prior to inductionOxygen Delivery Method: Simple face mask Intubation Type: IV induction Placement Confirmation: positive ETCO2

## 2016-02-03 NOTE — Op Note (Signed)
PATIENT ID:      Brooke Williams  MRN:     [id number] DOB/AGE:    May 24, 1946 / 70 y.o.       OPERATIVE REPORT    DATE OF PROCEDURE:  02/03/2016       PREOPERATIVE DIAGNOSIS:   LOOSE LEFT UNI KNEE      Estimated body mass index is 27.35 kg/(m^2) as calculated from the following:   Height as of this encounter: 5' 6.5" (1.689 m).   Weight as of this encounter: 78.019 kg (172 lb).                                                        POSTOPERATIVE DIAGNOSIS:   LOOSE LEFT UNI KNEE                                                                      PROCEDURE:  Procedure(s): TOTAL KNEE REVISION: Removal of loose unicondylar knee arthroplasty, revision using DepuyAttune RP implants #6L Femur, #6Tibia, 5 mm Attune RP bearing, 41 Patella     SURGEON: Ginna Schuur J    ASSISTANT:   Eric K[employer]   (Present and scrubbed throughout the case, critical for assistance with exposure, retraction, instrumentation, and closure.)         ANESTHESIA: Spinal, 20cc Exparel, 20cc 0.5% Marcaine  EBL: 300  FLUID REPLACEMENT: 1500 crystalloid  TOURNIQUET TIME: 62min  Drains: None  Tranexamic Acid: 1gm iv 2gm toical   COMPLICATIONS:  None         INDICATIONS FOR PROCEDURE: The patient has  LOOSE LEFT UNI KNEE, no deformities, XR  showed probable loosening of the tibial component which was polyethylene cemented and this was confirmed by bone scan. Patient is now limping constantly the pain wakes her at night and is worse whenever she is ambulating. No fevers no chills and no wound problems. Risks and benefits of surgery have been discussed, questions answered.   DESCRIPTION OF PROCEDURE: The patient identified by armband, received  IV antibiotics, in the holding area at [facility]. Patient taken to the operating room, appropriate anesthetic  monitors were attached, and Spinal anesthesia was  induced. Tourniquet  applied high to the operative thigh. Lateral post and foot positioner  applied to the  table, the lower extremity was then prepped and draped  in usual sterile fashion from the toes to the tourniquet. Time-out procedure was performed. We began the operation, with the knee flexed 120 degrees, by making the  incision follow the old unicondylar knee incision with extensions proximally and distally of 6 cm.  Small bleeders in the skin and the  subcutaneous tissue identified and cauterized. Transverse retinaculum was incised and reflected medially and a medial parapatellar arthrotomy was accomplished. the patella was everted and theprepatellar fat pad resected. The superficial medial collateral  ligament was then elevated from anterior to posterior along the proximal  flare of the tibia and anterior half of the menisci resected. The knee was hyperflexed exposimedial unicondylar components.. Peripheral and notch osteophytes as well as the cruciate ligaments were then  resected. the tibial component was noted to be grossly loose and was lifted out with a Coker forceps. The femoral component was removed using a quarter inch osteotome working around the component itself and it appeared to have been well fixed.We continued to  work our way around posteriorly along the proximal tibia, and externally  rotated the tibia subluxing it out from underneath the femur. A McHale  retractor was placed through the notch and a lateral Hohmann retractor  placed, and we then drilled through the proximal tibia in line with the  axis of the tibia followed by an intramedullary guide rod and 2-degree  posterior slope cutting guide. The tibial cutting guide, 3 degree posterior sloped, was pinned into place allowing resection of 89mm mm of bone medially and 9 mm of bone laterally. Satisfied with the tibial resection, we then  entered the distal femur 2 mm anterior to the PCL origin with the  intramedullary guide rod and applied the distal femoral cutting guide  set at 9 mm, with 5 degrees of valgus. This was pinned along  the  epicondylar axis. At this point, the distal femoral cut was accomplished without difficulty, a very small amount of bone was removed from the distal medial femoral condyle because of the prior implant. We then sized for a #6L femoral component and pinned the guide in 3 degrees of external rotation, compensating for the missing posterior medial femoral condyle . The chamfer cutting guide was pinned into place. The anterior, posterior, and chamfer cuts were accomplished without difficulty followed by  the Attune RP box cutting guide and the box cut. We also removed posterior osteophytes from the posterior femoral condyles. At this  time, the knee was brought into full extension. We checked our  extension and flexion gaps and found them symmetric for a 5 mm bearing. Distracting in extension with a lamina spreader, the posterior horns of the menisci were removed, and Exparel, diluted to 60 cc, with 20cc NS, and 20cc 0.5% Marcaine,was injected into the capsule and synovium of the knee. The posterior patella cut was accomplished with the 9.5 mm Attune cutting guide, sized for a 40mm dome, and the fixation pegs drilled.The knee  was then once again hyperflexed exposing the proximal tibia. We sized for a # 6 tibial base plate, applied the smokestack and the conical reamer followed by the the Delta fin keel punch. We then hammered into place the Attune RP trial femoral component, drilled the lugs, inserted a  5 mm trial bearing, trial patellar button, and took the knee through range of motion from 0-130 degrees. No thumb pressure was required for patellar Tracking. At this point, the limb was wrapped with an Esmarch bandage and the tourniquet inflated to 350 mmHg. All trial components were removed, mating surfaces irrigated with pulse lavage, and dried with suction and sponges. A double batch of DePuy HV cement with 1500 mg of Zinacef was mixed and applied to all bony metallic mating surfaces except for the  posterior condyles of the femur itself. In order, we  hammered into place the tibial tray and removed excess cement, the femoral component and removed excess cement. The final Attune RP bearing  was inserted, and the knee brought to full extension with compression.  The patellar button was clamped into place, and excess cement  removed. While the cement cured the wound was irrigated out with normal saline solution pulse lavage. Ligament stability and patellar tracking were checked and found to be excellent. The parapatellar  arthrotomy was closed with  running #1 Vicryl suture. The subcutaneous tissue with 0 and 2-0 undyed  Vicryl suture, and the skin with running 3-0 SQ vicryl. A dressing of Xeroform,  4 x 4, dressing sponges, Webril, and Ace wrap applied. The patient  awakened, and taken to recovery room without difficulty.   Frederik Pear J 02/03/2016, 12:08 PM

## 2016-02-04 ENCOUNTER — Encounter (HOSPITAL_COMMUNITY): Payer: Self-pay | Admitting: Orthopedic Surgery

## 2016-02-04 DIAGNOSIS — K120 Recurrent oral aphthae: Secondary | ICD-10-CM | POA: Insufficient documentation

## 2016-02-04 DIAGNOSIS — D235 Other benign neoplasm of skin of trunk: Secondary | ICD-10-CM | POA: Insufficient documentation

## 2016-02-04 LAB — CBC
HCT: 33.7 % — ABNORMAL LOW (ref 36.0–46.0)
Hemoglobin: 11.2 g/dL — ABNORMAL LOW (ref 12.0–15.0)
MCH: 30.5 pg (ref 26.0–34.0)
MCHC: 33.2 g/dL (ref 30.0–36.0)
MCV: 91.8 fL (ref 78.0–100.0)
Platelets: 218 10*3/uL (ref 150–400)
RBC: 3.67 MIL/uL — ABNORMAL LOW (ref 3.87–5.11)
RDW: 14.1 % (ref 11.5–15.5)
WBC: 9.5 10*3/uL (ref 4.0–10.5)

## 2016-02-04 LAB — BASIC METABOLIC PANEL
Anion gap: 12 (ref 5–15)
BUN: 10 mg/dL (ref 6–20)
CO2: 24 mmol/L (ref 22–32)
Calcium: 9.2 mg/dL (ref 8.9–10.3)
Chloride: 106 mmol/L (ref 101–111)
Creatinine, Ser: 0.67 mg/dL (ref 0.44–1.00)
GFR calc Af Amer: >60 mL/min (ref >60)
GFR calc non Af Amer: >60 mL/min (ref >60)
Glucose, Bld: 124 mg/dL — ABNORMAL HIGH (ref 65–99)
Potassium: 4.3 mmol/L (ref 3.5–5.1)
Sodium: 142 mmol/L (ref 135–145)

## 2016-02-04 MED ORDER — HYDROMORPHONE HCL 2 MG PO TABS: 2.0000 mg | ORAL_TABLET | ORAL | Status: DC | PRN

## 2016-02-04 NOTE — Progress Notes (Addendum)
Physical Therapy Treatment Patient Details Name: Brooke Williams MRN: [id number] DOB: Aug 29, 1946 Today's Date: 02/04/2016    History of Present Illness 70 yo female s/p L total knee revision on 02/03/2016     PT Comments    Patient seen for increased mobility and exercise program. Patient mobilizing well with use of RW. Tolerated ambulation and therapeutic exercises this afternoon. Will continue to see and progress as tolerated. Recommend one more session for stair negotiation and car transfer training in preparation for discharge.   Follow Up Recommendations  Home health PT     Equipment Recommendations  Rolling walker with 5" wheels;3in1 (PT)    Recommendations for Other Services       Precautions / Restrictions Precautions Precautions: Knee Precaution Comments: reinforced education and reviewed precautions Restrictions Weight Bearing Restrictions: Yes LLE Weight Bearing: Weight bearing as tolerated    Mobility  Bed Mobility Overal bed mobility: Needs Assistance Bed Mobility: Supine to Sit;Sit to Supine     Supine to sit: Supervision Sit to supine: Supervision   General bed mobility comments: Good carry over of technique, one verbal cue to perform. no physical assist required  Transfers Overall transfer level: Needs assistance Equipment used: Rolling walker (2 wheeled)   Sit to Stand: Supervision         General transfer comment: VCs for hand placement and positioning, no physical assist required  Ambulation/Gait Ambulation/Gait assistance: Supervision Ambulation Distance (Feet): 70 Feet Assistive device: Rolling walker (2 wheeled) Gait Pattern/deviations: Step-through pattern;Decreased stride length;Antalgic;Narrow base of support Gait velocity: decreased   General Gait Details: continues to remain slow and gaurded. VCs for positioning with use of RW and sequencing. Cues for step thru cadence   Stairs            Wheelchair Mobility    Modified  Rankin (Stroke Patients Only)       Balance     Sitting balance-Leahy Scale: Good       Standing balance-Leahy Scale: Fair                      Cognition Arousal/Alertness: Awake/alert Behavior During Therapy: WFL for tasks assessed/performed Overall Cognitive Status: Within Functional Limits for tasks assessed                      Exercises Total Joint Exercises Ankle Circles/Pumps: AROM;Left;5 reps Quad Sets: AROM;Left;5 reps Towel Squeeze: AROM;Left;5 reps Short Arc Quad: AROM;Left;5 reps Heel Slides: AROM;Left;5 reps Hip ABduction/ADduction: AROM;Left;5 reps    General Comments        Pertinent Vitals/Pain Pain Assessment: 0-10 Pain Score: 6  Pain Location: left knee Pain Descriptors / Indicators: Sore Pain Intervention(s): Monitored during session;Limited activity within patient's tolerance;Repositioned    Home Living                      Prior Function            PT Goals (current goals can now be found in the care plan section) Acute Rehab PT Goals Patient Stated Goal: to go home in the morning PT Goal Formulation: With patient/family Time For Goal Achievement: 02/18/16 Potential to Achieve Goals: Good Progress towards PT goals: Progressing toward goals    Frequency  7X/week    PT Plan Current plan remains appropriate    Co-evaluation             End of Session Equipment Utilized During Treatment: Gait belt Activity Tolerance: Patient tolerated  treatment well;Patient limited by pain Patient left: with call bell/phone within reach;with family/visitor present, in bed     Time: [id number] PT Time Calculation (min) (ACUTE ONLY): 24 min  Charges:  $Gait Training: 8-22 mins $Therapeutic Exercise: 8-22 mins                    G CodesDuncan Dull 02/15/2016, 4:00 PM Alben Deeds, Wiggins DPT  [phone]

## 2016-02-04 NOTE — Progress Notes (Signed)
Patient ID: Brooke Williams, female   DOB: March 11, 1946, 70 y.o.   MRN: [id number] PATIENT ID: Brooke Williams  MRN: [id number]  DOB/AGE:  July 04, 1946 / 70 y.o.  1 Day Post-Op Procedure(s) (LRB): TOTAL KNEE REVISION (Left)    PROGRESS NOTE Subjective: Patient is alert, oriented, x1 Nausea, no Vomiting, yes passing gas. Taking PO well. Denies SOB, Chest or Calf Pain. Using Incentive Spirometer, PAS in place. Ambulate WBAT, CPM 0-40 Patient reports pain as 5/10 .    Objective: Vital signs in last 24 hours: Filed Vitals:   02/03/16 1415 02/03/16 1430 02/03/16 1450 02/03/16 2030  BP:   132/72 90/67  Pulse: 73  78 85  Temp:  98.1 F (36.7 C) 98.4 F (36.9 C) 98.2 F (36.8 C)  TempSrc:   Oral Oral  Resp: 18 19 18 18   Height:      Weight:      SpO2: 100% 100% 98% 95%      Intake/Output from previous day: I/O last 3 completed shifts: In: 2093.8 [I.V.:2093.8] Out: 350 [Urine:250; Blood:100]   Intake/Output this shift:     LABORATORY DATA:  Recent Labs  02/04/16 0532  WBC 9.5  HGB 11.2*  HCT 33.7*  PLT 218    Examination: Neurologically intact ABD soft Neurovascular intact Sensation intact distally Intact pulses distally Dorsiflexion/Plantar flexion intact Incision: dressing C/D/I No cellulitis present Compartment soft}  Assessment:   1 Day Post-Op Procedure(s) (LRB): TOTAL KNEE REVISION (Left) ADDITIONAL DIAGNOSIS: Expected Acute Blood Loss Anemia, Sleep Apnea and lupus, headaches  Plan: PT/OT WBAT, CPM 5/hrs day until ROM 0-90 degrees, then D/C CPM DVT Prophylaxis:  SCDx72hrs, ASA 325 mg BID x 2 weeks DISCHARGE PLAN: Home DISCHARGE NEEDS: HHPT, CPM, Walker and 3-in-1 comode seat     Ova Gillentine J 02/04/2016, 7:15 AM

## 2016-02-04 NOTE — Progress Notes (Signed)
Orthopedic Tech Progress Note Patient Details:  Brooke Williams Sep 09, 1946 [id number]  Patient ID: Brooke Williams, female   DOB: 1946/08/02, 70 y.o.   MRN: [id number] Applied cpm 0-40  Karolee Stamps 02/04/2016, 5:50 AM

## 2016-02-04 NOTE — Evaluation (Signed)
Physical Therapy Evaluation Patient Details Name: Brooke Williams MRN: [id number] DOB: 1945/12/16 Today's Date: 02/04/2016   History of Present Illness  70 yo female s/p L total knee revision on 02/03/2016   Clinical Impression  Patient demonstrates deficits in functional mobility as indicated below. Will need continued skilled PT to address deficits and maximize function. Will see as indicated and progress as tolerated.    Follow Up Recommendations Home health PT    Equipment Recommendations  Rolling walker with 5" wheels;3in1 (PT)    Recommendations for Other Services       Precautions / Restrictions Precautions Precautions: Knee Precaution Comments: educated and reviewed precautions Restrictions Weight Bearing Restrictions: Yes LLE Weight Bearing: Weight bearing as tolerated      Mobility  Bed Mobility Overal bed mobility: Needs Assistance Bed Mobility: Supine to Sit     Supine to sit: Supervision     General bed mobility comments: VCs for technique to self assist LLE to EOB, no physical assist needed  Transfers Overall transfer level: Needs assistance Equipment used: Rolling walker (2 wheeled) Transfers: Sit to/from Stand Sit to Stand: Min guard         General transfer comment: VCs for hand placement and positioning, no physical assist required  Ambulation/Gait Ambulation/Gait assistance: Min guard Ambulation Distance (Feet): 50 Feet Assistive device: Rolling walker (2 wheeled) Gait Pattern/deviations: Step-through pattern;Decreased stride length;Antalgic;Narrow base of support Gait velocity: decreased Gait velocity interpretation: Below normal speed for age/gender General Gait Details: slow and gaurded. VCs for positioning with use of RW and sequencing. Cues for step thru cadence  Stairs            Wheelchair Mobility    Modified Rankin (Stroke Patients Only)       Balance                                              Pertinent Vitals/Pain Pain Assessment: 0-10 Pain Score: 5  Pain Location: left knee Pain Descriptors / Indicators: Sore Pain Intervention(s): Limited activity within patient's tolerance;Monitored during session;Premedicated before session    Home Living Family/patient expects to be discharged to:: Private residence Living Arrangements: Spouse/significant other Available Help at Discharge: Family Type of Home: House Home Access: Stairs to enter Entrance Stairs-Rails: Can reach both Entrance Stairs-Number of Steps: 6 Home Layout: One level;Able to live on main level with bedroom/bathroom Home Equipment: Shower seat      Prior Function Level of Independence: Independent               Hand Dominance   Dominant Hand: Right    Extremity/Trunk Assessment   Upper Extremity Assessment: Overall WFL for tasks assessed           Lower Extremity Assessment: Overall WFL for tasks assessed;LLE deficits/detail         Communication   Communication: No difficulties  Cognition Arousal/Alertness: Awake/alert Behavior During Therapy: WFL for tasks assessed/performed Overall Cognitive Status: Within Functional Limits for tasks assessed                      General Comments      Exercises Total Joint Exercises Goniometric ROM: -5 to 70      Assessment/Plan    PT Assessment Patient needs continued PT services  PT Diagnosis Difficulty walking;Abnormality of gait;Acute pain   PT Problem List Decreased strength;Decreased activity  tolerance;Decreased balance;Decreased mobility;Decreased knowledge of use of DME;Pain  PT Treatment Interventions DME instruction;Gait training;Stair training;Functional mobility training;Therapeutic activities;Therapeutic exercise;Balance training;Patient/family education   PT Goals (Current goals can be found in the Care Plan section) Acute Rehab PT Goals Patient Stated Goal: to go home PT Goal Formulation: With  patient/family Time For Goal Achievement: 02/18/16 Potential to Achieve Goals: Good    Frequency 7X/week   Barriers to discharge        Co-evaluation PT/OT/SLP Co-Evaluation/Treatment:  (dove tailed evaluation with OT)             End of Session Equipment Utilized During Treatment: Gait belt Activity Tolerance: Patient tolerated treatment well;Patient limited by pain Patient left: with call bell/phone within reach;with family/visitor present;Other (comment) (sitting one couch) Nurse Communication: Mobility status         Time: [id number] PT Time Calculation (min) (ACUTE ONLY): 27 min   Charges:   PT Evaluation $PT Eval Moderate Complexity: 1 Procedure     PT G CodesDuncan Dull February 27, 2016, 10:05 AM  Alben Deeds, PT DPT  [phone]

## 2016-02-04 NOTE — Care Management Note (Signed)
Case Management Note  Patient Details  Name: Brooke Williams MRN: [id number] Date of Birth: [birth year]  Subjective/Objective:       S/p left total knee arthroplasty             Action/Plan: Set up with [facility] for HHPT by MD office. Spoke with patient and her husband, no change in discharge plan. [employer] delivered CPM to home, contacted Jeneen Rinks at [facility], due to patient having Medicare,and requested rolling walker and 3N1  be delivered to patient's room. Patient stated that her husband will be able to assist after discharge.   Expected Discharge Date:                  Expected Discharge Plan:  [place]  In-House Referral:  NA  Discharge planning Services  CM Consult  Post Acute Care Choice:  Durable Medical Equipment, Home Health Choice offered to:  Patient  DME Arranged:  3-N-1, Walker rolling, CPM DME Agency:  [place] [employer]  HH Arranged:  PT HH Agency:  [facility]  Status of Service:  Completed, signed off  Medicare Important Message Given:    Date Medicare IM Given:    Medicare IM give by:    Date Additional Medicare IM Given:    Additional Medicare Important Message give by:     If discussed at [place] of Stay Meetings, dates discussed:    Additional Comments:  Nila Nephew, RN 02/04/2016, 11:17 AM

## 2016-02-04 NOTE — Progress Notes (Signed)
Orthopedic Tech Progress Note Patient Details:  Brooke Williams 04-23-1946 [id number]  Patient ID: Brooke Williams, female   DOB: [birth year], 70 y.o.   MRN: [id number] Took pt. off cpm  Karolee Stamps 02/04/2016, 8:28 PM

## 2016-02-04 NOTE — Progress Notes (Signed)
Occupational Therapy Evaluation Patient Details Name: Brooke Williams MRN: [id number] DOB: [birth year] Today's Date: 02/04/2016    History of Present Illness 71 yo female s/p L total knee revision on 02/03/2016    Clinical Impression   PTA, pt independent with ADL and mobility. Pt making good progress. Currently min A with ADL and mobility. Pt will have 24/7 S available at D/C and will be able to D/C home when medically stable. Will follow up in am to address shower transfer and address home safety to facilitate safe D/C home.     Follow Up Recommendations  No OT follow up;Supervision - Intermittent    Equipment Recommendations  3 in 1 bedside comode    Recommendations for Other Services       Precautions / Restrictions Precautions Precautions: Knee Precaution Comments: educated and reviewed precautions Restrictions Weight Bearing Restrictions: Yes LLE Weight Bearing: Weight bearing as tolerated      Mobility Bed Mobility Overal bed mobility: Needs Assistance Bed Mobility: Supine to Sit     Supine to sit: Supervision     General bed mobility comments: VCs for technique to self assist LLE to EOB, no physical assist needed  Transfers Overall transfer level: Needs assistance Equipment used: Rolling walker (2 wheeled)   Sit to Stand: Min guard         General transfer comment: VCs for hand placement and positioning, no physical assist required    Balance Overall balance assessment: Needs assistance   Sitting balance-Leahy Scale: Good       Standing balance-Leahy Scale: Fair                              ADL Overall ADL's : Needs assistance/impaired         Upper Body Bathing: Set up;Sitting   Lower Body Bathing: Minimal assistance;Sit to/from stand   Upper Body Dressing : Set up;Sitting   Lower Body Dressing: Minimal assistance;Sit to/from stand   Toilet Transfer: Comfort height toilet;Ambulation;Min guard   Toileting- Clothing  Manipulation and Hygiene: Modified independent;Sit to/from stand       Functional mobility during ADLs: Minimal assistance;Rolling walker General ADL Comments: Pt doing well. Initially more difficulty with mobiilty and required vc on sequencing and correct use of RW and safety/hand placement. Husband will be present after D/C to assist.                     Pertinent Vitals/Pain Pain Assessment: 0-10 Pain Score: 5  Pain Location: L knee Pain Descriptors / Indicators: Aching;Sore Pain Intervention(s): Limited activity within patient's tolerance;Repositioned;Ice applied     Hand Dominance Right   Extremity/Trunk Assessment Upper Extremity Assessment Upper Extremity Assessment: Overall WFL for tasks assessed   Lower Extremity Assessment Lower Extremity Assessment: Defer to PT evaluation LLE: Unable to fully assess due to pain   Cervical / Trunk Assessment Cervical / Trunk Assessment: Normal   Communication Communication Communication: No difficulties   Cognition Arousal/Alertness: Awake/alert Behavior During Therapy: WFL for tasks assessed/performed Overall Cognitive Status: Within Functional Limits for tasks assessed                     General Comments                    Home Living Family/patient expects to be discharged to:: Private residence Living Arrangements: Spouse/significant other Available Help at Discharge: Family Type of Home: House Home Access:  Stairs to enter [employer] of Steps: 6 Entrance Stairs-Rails: Can reach both Home Layout: One level;Able to live on main level with bedroom/bathroom     Bathroom Shower/Tub: Occupational psychologist: Standard Bathroom Accessibility: Yes How Accessible: Accessible via walker Home Equipment: Shower seat          Prior Functioning/Environment Level of Independence: Independent             OT Diagnosis: Generalized weakness;Acute pain   OT Problem List:  Decreased strength;Decreased range of motion;Decreased activity tolerance;Decreased safety awareness;Decreased knowledge of use of DME or AE;Decreased knowledge of precautions;Pain   OT Treatment/Interventions: Self-care/ADL training;DME and/or AE instruction;Therapeutic activities;Patient/family education    OT Goals(Current goals can be found in the care plan section) Acute Rehab OT Goals Patient Stated Goal: to go home OT Goal Formulation: With patient Time For Goal Achievement: 02/11/16 Potential to Achieve Goals: Good  OT Frequency: Min 2X/week   Barriers to D/C:            Co-evaluation PT/OT/SLP Co-Evaluation/Treatment: Yes Reason for Co-Treatment: For patient/therapist safety (partial session)   OT goals addressed during session: ADL's and self-care      End of Session Equipment Utilized During Treatment: Gait belt;Rolling walker CPM Left Knee CPM Left Knee: Off Additional Comments: trapeze bar helper Nurse Communication: Mobility status  Activity Tolerance: Patient tolerated treatment well Patient left: in chair;with call bell/phone within reach;with family/visitor present   Time: [id number] OT Time Calculation (min): 29 min Charges:  OT General Charges $OT Visit: 1 Procedure OT Evaluation $OT Eval Moderate Complexity: 1 Procedure G-Codes:    Brooke Williams,Brooke Williams 2016/02/13, 1:14 PM   [facility], OTR/L  [phone] 2016/02/13

## 2016-02-05 LAB — CBC
HCT: 34.6 % — ABNORMAL LOW (ref 36.0–46.0)
Hemoglobin: 11 g/dL — ABNORMAL LOW (ref 12.0–15.0)
MCH: 29.6 pg (ref 26.0–34.0)
MCHC: 31.8 g/dL (ref 30.0–36.0)
MCV: 93 fL (ref 78.0–100.0)
Platelets: 198 10*3/uL (ref 150–400)
RBC: 3.72 MIL/uL — ABNORMAL LOW (ref 3.87–5.11)
RDW: 14.2 % (ref 11.5–15.5)
WBC: 6.7 10*3/uL (ref 4.0–10.5)

## 2016-02-05 NOTE — Discharge Summary (Signed)
Patient ID: Brooke Williams MRN: [id number] DOB/AGE: 1946/11/14 70 y.o.  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 PONV (postoperative nausea and vomiting)   . Seasonal allergies     takes Allegra daily and uses Astelin Nasal Spray as needed  . GERD (gastroesophageal reflux disease)     controls with Zantac  . Arthritis     arthritis-hands. cervical disc degeneration  . Endometrial cancer ([place]) 2014  . Restless leg syndrome     takes Mirapex nightly  . Hypothyroidism     takes Synthroid daily  . Hyperlipidemia     takes krill oil daily  . Pneumonia     hx of-within the last 15 yrs  . Headache(784.0)     headaches frequently but states since placed on 2L/m via N/C headaches have improved  . Dizziness     rarely,takes Meclizine if needed  . Lupus ([place])     remission since 2008  . Joint pain   . Joint swelling   . Back pain     6th vertebrae has collapse on 7th,pinched nerve feeling occ  . Ileus ([place]) 10/2015    was treated  . History of colon polyps     benign  . Nocturia   . OSA (obstructive sleep apnea)     sleep study in epic from 11-01-15.Uses Oxygen  . History of staph infection     Surgeries: Procedure(s): TOTAL KNEE REVISION on 02/03/2016   Consultants:    Discharged Condition: Improved  Hospital Course: Brooke Williams is an 70 y.o. female who was admitted 02/03/2016 for operative treatment ofLoose total knee arthroplasty ([place]). Patient has severe unremitting pain that affects sleep, daily activities, and work/hobbies. After pre-op clearance the patient was taken to the operating room on 02/03/2016 and underwent  Procedure(s): TOTAL KNEE REVISION.    Patient was given perioperative antibiotics: Anti-infectives    Start     Dose/Rate Route Frequency Ordered Stop   02/03/16 1000  vancomycin  (VANCOCIN) IVPB 1000 mg/200 mL premix     1,000 mg 200 mL/hr over 60 Minutes Intravenous To [facility] 01/31/16 1101 02/03/16 1030       Patient was given sequential compression devices, early ambulation, and chemoprophylaxis to prevent DVT.  Patient benefited maximally from hospital stay and there were no complications.    Recent vital signs: Patient Vitals for the past 24 hrs:  BP Temp Temp src Pulse Resp SpO2  02/05/16 0500 112/61 mmHg 98.5 F (36.9 C) Oral 94 18 97 %  02/04/16 2100 118/75 mmHg 99.7 F (37.6 C) Oral (!) 112 18 95 %  02/04/16 1432 121/62 mmHg 98.9 F (37.2 C) Oral 99 18 100 %     Recent laboratory studies:  Recent Labs  02/04/16 0532 02/05/16 0500  WBC 9.5 6.7  HGB 11.2* 11.0*  HCT 33.7* 34.6*  PLT 218 198  NA 142  --   K 4.3  --   CL 106  --   CO2 24  --   BUN 10  --   CREATININE 0.67  --   GLUCOSE 124*  --   CALCIUM 9.2  --      Discharge Medications:     Medication List    STOP taking these medications        ibuprofen 800 MG tablet  Commonly known as:  ADVIL,MOTRIN     meloxicam 15 MG tablet  Commonly known as:  MOBIC      TAKE these medications        Alpha Lipoic Acid 200 MG Caps  Take 600 mg by mouth daily.     aspirin EC 325 MG tablet  Take 1 tablet (325 mg total) by mouth 2 (two) times daily.     ASTRAGALUS PO  Take 800 mg by mouth daily.     azelastine 0.1 % nasal spray  Commonly known as:  ASTELIN  Place 1 spray into both nostrils daily as needed for allergies.     BLACK COHOSH PO  Take 800 mg by mouth daily.     fexofenadine 60 MG tablet  Commonly known as:  ALLEGRA  Take 60 mg by mouth daily as needed for allergies or rhinitis.     fluocinonide gel 0.05 %  Commonly known as:  LIDEX  APPLY TO AFFECTED AREA daily as needed     Krill Oil Omega-3 500 MG Caps  Take 2 capsules by mouth daily.     magic mouthwash Soln  Swish and spit 10 mLs daily as needed for mouth pain.     multivitamin with  minerals Tabs tablet  Take 1 tablet by mouth daily.     oxyCODONE-acetaminophen 5-325 MG tablet  Commonly known as:  ROXICET  Take 1 tablet by mouth every 4 (four) hours as needed.     pramipexole 0.25 MG tablet  Commonly known as:  MIRAPEX  Take 1 tablet (0.25 mg total) by mouth Nightly.     pseudoephedrine 120 MG 12 hr tablet  Commonly known as:  SUDAFED  Take 120 mg by mouth daily as needed for congestion.     ranitidine 150 MG tablet  Commonly known as:  ZANTAC  Take 150 mg by mouth daily as needed for heartburn.     SYNTHROID 75 MCG tablet  Generic drug:  levothyroxine  Take 75 mcg by mouth daily before breakfast.     tizanidine 2 MG capsule  Commonly known as:  ZANAFLEX  Take 1 capsule (2 mg total) by mouth 3 (three) times daily.     triamcinolone cream 0.1 %  Commonly known as:  KENALOG  Apply 1 application topically daily as needed (rash).        Diagnostic Studies: Dg Chest 2 View  01/24/2016  CLINICAL DATA:  Preoperative examination prior to total knee joint revision EXAM: CHEST  2 VIEW COMPARISON:  Chest x-ray of December 16, 2012. FINDINGS: The lungs are well-expanded there is no alveolar infiltrate. There has been slight interval growth in the faintly calcified nodule which lies in the right lower lobe. It now measures approximately 13 mm in diameter. No nodules are noted elsewhere. The heart and pulmonary vascularity are normal. The mediastinum is normal in width. There is no pleural effusion. The observed bony thorax is unremarkable. IMPRESSION: Slight interval growth in the faintly calcified nodule in the right lower lobe. Given this subtle change over the past 3 years, chest CT scanning is recommended. Electronically Signed   By: David  Martinique M.D.   On: 01/24/2016 13:52    Disposition: 01-Home or Self Care      Discharge Instructions    CPM    Complete by:  As directed   Continuous passive motion machine (CPM):      Use the CPM from 0 to 60  for 5 hours  per day.  You may increase by 10 degrees per day.  You may break it up into 2 or 3 sessions per day.      Use CPM for 2 weeks or until you are told to stop.     Call MD / Call 911    Complete by:  As directed   If you experience chest pain or shortness of breath, CALL 911 and be transported to the hospital emergency room.  If you develope a fever above 101 F, pus (white drainage) or increased drainage or redness at the wound, or calf pain, call your surgeon's office.     Constipation Prevention    Complete by:  As directed   Drink plenty of fluids.  Prune juice may be helpful.  You may use a stool softener, such as Colace (over the counter) 100 mg twice a day.  Use MiraLax (over the counter) for constipation as needed.     Diet - low sodium heart healthy    Complete by:  As directed      Driving restrictions    Complete by:  As directed   No driving for 2 weeks     Increase activity slowly as tolerated    Complete by:  As directed      Patient may shower    Complete by:  As directed   You may shower without a dressing once there is no drainage.  Do not wash over the wound.  If drainage remains, cover wound with plastic wrap and then shower.           Follow-up Information    Follow up with Kerin Salen, MD In 2 weeks.   Specialty:  Orthopedic Surgery   Contact information:   [place] [address] [phone]       Follow up with [facility]   Why:  They will contact you to schedule home therapy visits.   Contact information:   [address] [place] [address] [phone]        Signed: Hardin Negus, Keondra Haydu R 02/05/2016, 7:43 AM

## 2016-02-05 NOTE — Progress Notes (Signed)
Occupational Therapy Treatment/Discharge Patient Details Name: Brooke Williams MRN: [id number] DOB: 12-May-1946 Today's Date: 02/05/2016    History of present illness 70 yo female s/p L total knee revision on 02/03/2016    OT comments  Pt completed all functional transfers and ADLs with min guard-supervision level assist. Educated pt on compensatory strategies for ADLs, energy conservation, pain/edema management, and fall prevention strategies. All education has been completed and pt has no further questions. Pt with no further acute OT needs. Pt adequate for discharge from occupational therapy standpoint. OT signing off.   Follow Up Recommendations  No OT follow up;Supervision - Intermittent    Equipment Recommendations  3 in 1 bedside comode    Recommendations for Other Services      Precautions / Restrictions Precautions Precautions: Knee Precaution Booklet Issued: No Precaution Comments: reviewed not placing pillow, ice pack or other object under L knee Restrictions Weight Bearing Restrictions: Yes LLE Weight Bearing: Weight bearing as tolerated       Mobility Bed Mobility Overal bed mobility: Needs Assistance Bed Mobility: Supine to Sit     Supine to sit: Supervision     General bed mobility comments: HOB elevated, no use of bedrails. Pt used R foot to hook under L ankle to elevate LLE off bed. No physical assist required.  Transfers Overall transfer level: Needs assistance Equipment used: Rolling walker (2 wheeled) Transfers: Sit to/from Stand Sit to Stand: Min guard         General transfer comment: Min guard for safety. Verbal cues for safe hand placement on seated surfaces.    Balance Overall balance assessment: Needs assistance Sitting-balance support: No upper extremity supported;Feet supported Sitting balance-Leahy Scale: Good     Standing balance support: During functional activity;No upper extremity supported Standing balance-Leahy Scale: Fair                      ADL Overall ADL's : Needs assistance/impaired     Grooming: Wash/dry Radiographer, therapeutic: Min guard;Ambulation;BSC;RW Toilet Transfer Details (indicate cue type and reason): BSC over toilet Toileting- Clothing Manipulation and Hygiene: Min guard;Sit to/from stand   Tub/ Shower Transfer: Min guard;Cueing for sequencing;Ambulation;Rolling walker Tub/Shower Transfer Details (indicate cue type and reason): Cues for proper step sequence with RW Functional mobility during ADLs: Min guard;Rolling walker General ADL Comments: Reviewed knee precautions, compensatory strategies for ADLs, energy conservation and fall prevention strateiges. Husband present for session      Vision                     Perception     Praxis      Cognition   Behavior During Therapy: WFL for tasks assessed/performed Overall Cognitive Status: Within Functional Limits for tasks assessed                       Extremity/Trunk Assessment               Exercises     Shoulder Instructions       General Comments      Pertinent Vitals/ Pain       Pain Assessment: 0-10 Pain Score: 3  Pain Location: L knee Pain Descriptors / Indicators: Sore Pain Intervention(s): Limited activity within patient's tolerance;Monitored during session;Repositioned  Home Living  Prior Functioning/Environment              Frequency       Progress Toward Goals  OT Goals(current goals can now be found in the care plan section)  Progress towards OT goals: Goals met/education completed, patient discharged from OT  Acute Rehab OT Goals Patient Stated Goal: to go home OT Goal Formulation: With patient Time For Goal Achievement: 02/11/16 Potential to Achieve Goals: Good ADL Goals Pt Will Perform Tub/Shower Transfer: Shower transfer;ambulating;3 in 1;rolling  walker;with supervision;with caregiver independent in assisting  Plan All goals met and education completed, patient discharged from OT services    Co-evaluation                 End of Session Equipment Utilized During Treatment: Gait belt;Rolling walker CPM Left Knee CPM Left Knee: Off Left Knee Flexion (Degrees): 40 Left Knee Extension (Degrees): 0 Additional Comments: trapeze bar helper   Activity Tolerance Patient tolerated treatment well   Patient Left Other (comment) (with PT)   Nurse Communication Mobility status        Time: 3276-1470 OT Time Calculation (min): 11 min  Charges: OT General Charges $OT Visit: 1 Procedure OT Treatments $Self Care/Home Management : 8-22 mins  Redmond Baseman, OTR/L Pager: [phone] 02/05/2016, 9:00 AM

## 2016-02-05 NOTE — Progress Notes (Signed)
PATIENT ID: Brooke Williams  MRN: [id number]  DOB/AGE:  October 04, 1946 / 70 y.o.  2 Days Post-Op Procedure(s) (LRB): TOTAL KNEE REVISION (Left)    PROGRESS NOTE Subjective: Patient is alert, oriented, no Nausea, no Vomiting, yes passing gas. Taking PO well. Denies SOB, Chest or Calf Pain. Using Incentive Spirometer, PAS in place. Ambulate WBAT with pt walking 70 ft with therapy, CPM 0-40 Patient reports pain as mild at rest .    Objective: Vital signs in last 24 hours: Filed Vitals:   02/04/16 0700 02/04/16 1432 02/04/16 2100 02/05/16 0500  BP: 99/63 121/62 118/75 112/61  Pulse: 109 99 112 94  Temp: 98.1 F (36.7 C) 98.9 F (37.2 C) 99.7 F (37.6 C) 98.5 F (36.9 C)  TempSrc: Oral Oral Oral Oral  Resp: 18 18 18 18   Height:      Weight:      SpO2: 100% 100% 95% 97%      Intake/Output from previous day: I/O last 3 completed shifts: In: 1293.8 [I.V.:1293.8] Out: -    Intake/Output this shift:     LABORATORY DATA:  Recent Labs  02/04/16 0532 02/05/16 0500  WBC 9.5 6.7  HGB 11.2* 11.0*  HCT 33.7* 34.6*  PLT 218 198  NA 142  --   K 4.3  --   CL 106  --   CO2 24  --   BUN 10  --   CREATININE 0.67  --   GLUCOSE 124*  --   CALCIUM 9.2  --     Examination: Neurologically intact Neurovascular intact Sensation intact distally Intact pulses distally Dorsiflexion/Plantar flexion intact Incision: dressing C/D/I No cellulitis present Compartment soft}  Assessment:   2 Days Post-Op Procedure(s) (LRB): TOTAL KNEE REVISION (Left) ADDITIONAL DIAGNOSIS: Expected Acute Blood Loss Anemia,  Sleep Apnea, lupus, and headaches  Plan: PT/OT WBAT, CPM 5/hrs day until ROM 0-90 degrees, then D/C CPM DVT Prophylaxis:  SCDx72hrs, ASA 325 mg BID x 2 weeks DISCHARGE PLAN: Home DISCHARGE NEEDS: HHPT, CPM, Walker and 3-in-1 comode seat     Arieh Bogue R 02/05/2016, 7:38 AM

## 2016-02-05 NOTE — Progress Notes (Addendum)
Physical Therapy Treatment/ Discharge Patient Details Name: Brooke Williams MRN: [id number] DOB: [birth year] Today's Date: 02/05/2016    History of Present Illness 70 yo female s/p L total knee revision on 02/03/2016     PT Comments    Patient has progressed well with mobility. Has met goals for PT. Discussed and performed HEP, car transfers and performed stairs. No further acute PT needs, nsg notified.  Follow Up Recommendations  Home health PT     Equipment Recommendations  Rolling walker with 5" wheels;3in1 (PT)    Recommendations for Other Services       Precautions / Restrictions Precautions Precautions: Knee Precaution Booklet Issued: No Precaution Comments: reviewed not placing pillow, ice pack or other object under L knee Restrictions Weight Bearing Restrictions: Yes LLE Weight Bearing: Weight bearing as tolerated    Mobility  Bed Mobility Overal bed mobility: Needs Assistance Bed Mobility: Supine to Sit     Supine to sit: Supervision     General bed mobility comments: HOB elevated, no use of bedrails. Pt used R foot to hook under L ankle to elevate LLE off bed. No physical assist required.  Transfers Overall transfer level: Needs assistance Equipment used: Rolling walker (2 wheeled) Transfers: Sit to/from Stand Sit to Stand: Min guard         General transfer comment: Min guard for safety. Verbal cues for safe hand placement on seated surfaces.  Ambulation/Gait Ambulation/Gait assistance: Modified independent (Device/Increase time) Ambulation Distance (Feet): 210 Feet Assistive device: Rolling walker (2 wheeled) Gait Pattern/deviations: Step-through pattern;Decreased stride length;Antalgic;Narrow base of support Gait velocity: decreased       Stairs Stairs: Yes Stairs assistance: Supervision Stair Management: One rail Right;Step to pattern;Forwards Number of Stairs: 4 General stair comments: VCs for technique and sequencing, performed fwd and  backward  Wheelchair Mobility    Modified Rankin (Stroke Patients Only)       Balance Overall balance assessment: Needs assistance Sitting-balance support: No upper extremity supported Sitting balance-Leahy Scale: Good     Standing balance support: During functional activity Standing balance-Leahy Scale: Fair                      Cognition Arousal/Alertness: Awake/alert Behavior During Therapy: WFL for tasks assessed/performed Overall Cognitive Status: Within Functional Limits for tasks assessed                      Exercises Total Joint Exercises Long Arc Quad: AROM;Left;5 reps Knee Flexion: AROM;Left;5 reps Marching in Standing: AROM;Left;5 reps    General Comments        Pertinent Vitals/Pain Pain Assessment: 0-10 Pain Score: 4  Pain Location: L knee Pain Descriptors / Indicators: Sore Pain Intervention(s): Monitored during session;Limited activity within patient's tolerance;Repositioned    Home Living                      Prior Function            PT Goals (current goals can now be found in the care plan section) Acute Rehab PT Goals Patient Stated Goal: to go home PT Goal Formulation: With patient/family Time For Goal Achievement: 02/18/16 Potential to Achieve Goals: Good Progress towards PT goals: Goals met/education completed, patient discharged from PT    Frequency  7X/week    PT Plan Current plan remains appropriate    Co-evaluation             End of Session Equipment Utilized During Treatment:  Gait belt Activity Tolerance: Patient tolerated treatment well Patient left: in bed (sitting EOB)     Time: 6440-3474 PT Time Calculation (min) (ACUTE ONLY): 25 min  Charges:  $Gait Training: 8-22 mins $Self Care/Home Management: 8-22                    G CodesDuncan Dull 02-28-2016, 10:06 AM Alben Deeds, PT DPT  [phone]

## 2016-02-05 NOTE — Progress Notes (Signed)
Orthopedic Tech Progress Note Patient Details:  Brooke Williams [birth year] [id number]  Patient ID: Brooke Williams, female   DOB: [birth year], 70 y.o.   MRN: [id number] Applied cpm 0-40  Karolee Stamps 02/05/2016, 5:23 AM

## 2016-05-19 ENCOUNTER — Encounter: Payer: Self-pay | Admitting: Neurology

## 2016-05-19 ENCOUNTER — Telehealth: Payer: Self-pay | Admitting: Neurology

## 2016-05-19 ENCOUNTER — Ambulatory Visit (INDEPENDENT_AMBULATORY_CARE_PROVIDER_SITE_OTHER): Payer: Medicare Other | Admitting: Neurology

## 2016-05-19 VITALS — BP 120/80 | HR 80 | Resp 20 | Ht 67.0 in | Wt 164.0 lb

## 2016-05-19 DIAGNOSIS — G4730 Sleep apnea, unspecified: Secondary | ICD-10-CM | POA: Diagnosis not present

## 2016-05-19 DIAGNOSIS — G4710 Hypersomnia, unspecified: Secondary | ICD-10-CM

## 2016-05-19 DIAGNOSIS — G2581 Restless legs syndrome: Secondary | ICD-10-CM

## 2016-05-19 DIAGNOSIS — G4734 Idiopathic sleep related nonobstructive alveolar hypoventilation: Secondary | ICD-10-CM | POA: Diagnosis not present

## 2016-05-19 NOTE — Progress Notes (Signed)
NEUROLOGY CLINIC FOLLOW UP PATIENT NOTE  NAME: Brooke Williams DOB: [birth year]  History summary:  Brooke Williams is a 70 y.o. female with PMH of lupus, endometrial cancer s/p hysterectomy and chemotherapy, lipoma s/p surgery who presents as a new patient for Sleep apnea,  Brooke Williams is a new patient to our sleep clinic, she moved from [place] 15 years ago to [place]. She underwent a sleep study at the [facility] hardened sleep center where she was followed by Dr. Ellouise Newer her cardiologist. The study is dated 08-05-11 and was ordered upon the patient's report of excessive daytime sleepiness, waking up with headaches in the morning and feeling that her sleep was not restorative or refreshing. She had endorsed the Becks depression inventory at 16 points at that time and the Epworth scale at 12 points. She also had been told that she was a loud snorer and sometimes had difficulties falling asleep while still feeling very sleepy. The sleep problem had already persisted for longer than 2 years at the time that she was tested. Her sleep study had revealed an AHI of only 4.3 and during REM sleep at 13.1. The RDI which includes arousals from snoring and not just apneas, was 17.9 and during REM sleep increased to 39. The patient had extremely mild sleep apnea but a moderate to severe degree of upper airway resistance. She also support during her sleep study. She had significant periodic limb movements and Dr. Claiborne Billings reported 28.5 per hour were causing arousals which would be a more severe sleep disorder in her apnea was.  In the meantime the patient states that her restless legs which were present 4 years ago have progressed to a more severe degree. She goes to bed between 10 and 11 PM and usually falls asleep within 1 hour, some nights it may take 2 hours. As soon as she retreats to bed and watches television for example she gets the symptoms of restless legs. She wakes up routinely about  every 90 minutes during the night and sometimes she will go to the bathroom but it was not the bathroom call that woke her. She will rise in the morning at about 7 AM spontaneously. She remains and that for another 1-1/2 hours or so before she has the energy to get up. She still feels her sleep is not restorative or refreshing. She wakes frequently up with a headache, her headaches are there all the time but she reports the most severe part of the day in the morning. The headaches have not woken her but she wakes up with the headaches and is aware of them. He will also often go to bed with some headaches. The headaches are described as a pressure to the for head. There is no retro-orbital pressure and it does not temporal. It is does not prefer one side versus the other. She sometimes gets nauseated with her headaches lasting all day. There is a dizziness with headaches, feeling swimmy headed.  She sleeps on her left, avoids supine sleep. The bedroom is cool, quiet and dark.   Dr Phoebe Sharps note from October : Patient stated that she was diagnosed with endometrial cancer and had robotic hysterectomy in January 2014. However, in February 2015, she was found to have recurrent focal soft tissue tumor at the site of hysterectomy, consider endometrial cancer recurrence, she received chemotherapy for about 6 months. 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 Had a some of the  lipoma removed.   She has long-standing migraine headache for more than 30 years. The headache involves whole head, bandage like, 5-10/10 severity, associated with photophobia, no phonophobia or nausea vomiting. It happens several times a week, lasting 2 days, she has to lie down to help the headache. No identifiable triggers. She has tried more than 20 different medications, his symptoms only Robaxin and later gabapentin helped to take the edge off the pain. Since 2013, the headache getting worse  both in severity and the frequency. However, for the last 4 months, the headaches changed their pattern, became frontal headache currently, pressure type headache, constant, never went away, associate with joint ice, nose irritation, fatigue all day long. Gabapentin does not work this time, also tried icing, rubbing, topical cream, nothing worked. Also has intermittent blurry vision, she is going to see an eye doctor tomorrow.  She has history of a hypothyroidism, on Synthroid supplement. She went to see endocrinologist, checked TSH and T4 within normal limits, and also ruled out other endocrine disorders. However she was advised to have MRI done to rule out pituitary tumor due to complaints of blurred vision. She also follows with rheumatology for her lupus, she has had lupus since 2008, has been treated with plaqenil. Currently off the medication last oncology visit in July 2016 showed normal C3, C4, RF, ESR, negative ANA and Cardiolipin antibodies.  She has sleep study over 4  years ago was told that the treatment would require CPAP< OSA, but she did not  qualify for CPAP machine. However, she has constant insomnia, usually spend an hour before falling asleep, and able to sleep 2 hours longest. She denies any smoking, alcohol drinking, or illicit drugs.  11-19-15 . I'm meeting today with Brooke Williams regarding the recent polysomnography results. The patient suffered a paralytic ileus on December 4 and has correlated the lower stool frequency with the days of iron intake. I have requested her to take this equal iron supplement but she will only take it from now on every other day. Her sleep study did not reveal any apnea or shallow breathing. She had 34 minutes of lower oxygen saturation throughout the whole night study no evidence for CO2 retention. There was very little sleep actually recorded only 167 minutes of sleep were present during the sleep study. Her sleep seem to be fragmented by periodic limb  movements also these seem not to cause directly arousals. This could be a pattern of restless leg syndrome manifestation. This is often associated with iron deficiency anemia. The patient will also continue at a lower frequency to take the iron supplement pharmacotherapy will be discussed today and I would like for her to undergo pulmonary function testing with a pulmonologist given that she is an endometrial cancer survivor and may have had chemotherapy related changes.  05-19-2016, Brooke Williams had been started on nocturnal oxygen after seeing Dr. Melvyn Novas in the pulmonary section. She feels that it has improved her sleep. She has an oxygen concentrator available at home but she asked me today what she can do when she travels. The couple is mobile and likes to day at different hotels and having a concentrator delivered to one location would not help her necessarily in maintaining her style of holiday. I suggested that we should look into a portable small tank that may be exchanged or refilled at different locations during travel. I'm also thinking that vitamin improve her sleep it is not  causing pulmonary or cardiac damage should she not  have it available for a couple of nights. She feels more refreshed and restored after a night with oxygen availability. She also relates to me today that her restless legs have improved she has changed Mirapex from taking it nightly to taking it every other night because it made her dizzy in the morning hours. However it has improved her sleep and had an effect on the restless leg syndrome. In addition she started a multivitamin prenatal composition which contains iron and this also seems to have helped her. She looks a lot better rested today.    Interval history: During the interval time, pt stated that she has been doing the same. HA still there, sometimes pounding on the waking up in the morning. When she had her migraine in the past, she found only robaxin PRN was  helping some. She has not used any robaxin yet. She had treadmill test and bone scan were both unremarkable. Her MRI with and without contrast showed no pituitary tumor. She still has difficulty with sleep and snores with bad quality of sleep.  Epworth and Depression score, fatigue score were quoted above.    Past Medical History  Diagnosis Date  . PONV (postoperative nausea and vomiting)   . Seasonal allergies     takes Allegra daily and uses Astelin Nasal Spray as needed  . GERD (gastroesophageal reflux disease)     controls with Zantac  . Arthritis     arthritis-hands. cervical disc degeneration  . Endometrial cancer ([place]) 2014  . Restless leg syndrome     takes Mirapex nightly  . Hypothyroidism     takes Synthroid daily  . Hyperlipidemia     takes krill oil daily  . Pneumonia     hx of-within the last 15 yrs  . Headache(784.0)     headaches frequently but states since placed on 2L/m via N/C headaches have improved  . Dizziness     rarely,takes Meclizine if needed  . Lupus ([place])     remission since 2008  . Joint pain   . Joint swelling   . Back pain     6th vertebrae has collapse on 7th,pinched nerve feeling occ  . Ileus ([place]) 10/2015    was treated  . History of colon polyps     benign  . Nocturia   . OSA (obstructive sleep apnea)     sleep study in epic from 11-01-15.Uses Oxygen  . History of staph infection    Past Surgical History  Procedure Laterality Date  . Medial partial knee replacement  2005    LT knee  . Other surgical history      metal pin/screw to repair arthritis in LT thumb joint  . Hand surgery  2006    left thumb surgery with pin  . Dilation and curettage of uterus      2008-polyp removed  . Robotic assisted total hysterectomy with bilateral salpingo oopherectomy  12/20/2012    Procedure: ROBOTIC ASSISTED TOTAL HYSTERECTOMY WITH BILATERAL SALPINGO OOPHORECTOMY;  Surgeon: Imagene Gurney A. Alycia Rossetti, MD;  Location: [facility];  Service: Gynecology;  Laterality:  N/A;  WITH PELVIC PARAAORTIC   . Robotic pelvic and para-aortic lymph node dissection  12/20/2012    Procedure: ROBOTIC PELVIC AND PARA-AORTIC LYMPH NODE DISSECTION;  Surgeon: Imagene Gurney A. Alycia Rossetti, MD;  Location: [facility];  Service: Gynecology;  Laterality: Bilateral;  . Colonoscopy    . Mass excision N/A 10/05/2014    Procedure: EXCISION OF BILATERAL LOWER BACK MASSES;  Surgeon: Coralie Keens,  MD;  Location: [facility];  Service: General;  Laterality: N/A;  . Lipomas removed from back   40 yrs ago  . Abdominal hysterectomy    . Total knee revision Left 02/03/2016    Procedure: TOTAL KNEE REVISION;  Surgeon: Frederik Pear, MD;  Location: [facility];  Service: Orthopedics;  Laterality: Left;   Family History  Problem Relation Age of Onset  . Stroke Mother 63    arterial wall of artery in eye   . Endometrial cancer Mother 42  . Dementia Father   . Stroke Father     "lots of many strokes"  . Aneurysm Father 43    AAA  . Multiple births Maternal Grandmother     died 5 days after delivery of unknown causes  . Colon cancer Maternal Aunt     late 69s  . Cancer Cousin     maternal cousin with an unknown form of cancer   Current Outpatient Prescriptions  Medication Sig Dispense Refill  . Alpha Lipoic Acid 200 MG CAPS Take 600 mg by mouth daily.     . ASTRAGALUS PO Take 800 mg by mouth daily.     Marland Kitchen azelastine (ASTELIN) 0.1 % nasal spray Place 1 spray into both nostrils daily as needed for allergies.     Marland Kitchen BLACK COHOSH PO Take 800 mg by mouth daily.    . fexofenadine (ALLEGRA) 60 MG tablet Take 60 mg by mouth daily as needed for allergies or rhinitis.    . fluocinonide gel (LIDEX) 0.05 % APPLY TO AFFECTED AREA daily as needed  1  . Krill Oil Omega-3 500 MG CAPS Take 2 capsules by mouth daily.    . magic mouthwash SOLN Swish and spit 10 mLs daily as needed for mouth pain.   11  . Multiple Vitamin (MULTIVITAMIN WITH MINERALS) TABS Take 1 tablet by mouth daily.    . pramipexole (MIRAPEX)  0.25 MG tablet Take 1 tablet (0.25 mg total) by mouth Nightly. 90 tablet 1  . pseudoephedrine (SUDAFED) 120 MG 12 hr tablet Take 120 mg by mouth daily as needed for congestion.    . ranitidine (ZANTAC) 150 MG tablet Take 150 mg by mouth daily as needed for heartburn.     Marland Kitchen SYNTHROID 75 MCG tablet Take 75 mcg by mouth daily before breakfast.     . tizanidine (ZANAFLEX) 2 MG capsule Take 1 capsule (2 mg total) by mouth 3 (three) times daily. 60 capsule 0  . triamcinolone cream (KENALOG) 0.1 % Apply 1 application topically daily as needed (rash).      No current facility-administered medications for this visit.   Allergies  Allergen Reactions  . Cefdinir Hives, Itching and Rash  . Penicillins Hives    Has patient had a PCN reaction causing immediate rash, facial/tongue/throat swelling, SOB or lightheadedness with hypotension: No pt states she mostly had hives  Has patient had a PCN reaction occurring within the last 10 years: Yes  If all of the above answers are "NO", then may proceed with Cephalosporin use.     . Codeine Itching    Facial flushing  . Tape Itching and Rash    Skin turns red   Social History   Social History  . Marital Status: Married    Spouse Name: N/A  . Number of Children: 0  . Years of Education: N/A   Occupational History  . Not on file.   Social History Main Topics  . Smoking status: Never Smoker   .  Smokeless tobacco: Not on file  . Alcohol Use: Yes     Comment: occassional  . Drug Use: No  . Sexual Activity: Not Currently    Birth Control/ Protection: Post-menopausal   Other Topics Concern  . Not on file   Social History Narrative   Patient does not have any biological children but has step children    Review of Systems Full 14 system review of systems performed and notable only for those listed, all others are neg:  Constitutional:   Fatigue, EDS , morning headaches.   Physical Exam  Filed Vitals:   05/19/16 1017  BP: 120/80  Pulse:  80  Resp: 20    General - obese, well developed, in no apparent distress.   Cardiovascular - Regular rate and rhythm with no murmur. Carotid pulses were 2+ without bruits .   Neck - supple, no nuchal rigidity.  Neck circumference is 15-1/2 inches, Mallampati is grade 4 with a narrow and peaked palate. There is no redness or swelling of the upper airway noted. The patient has a slight retrognathia and a small lower jaw.    Mental Status -  Level of arousal and orientation to time, place, and person were intact. Language including expression, naming, repetition, comprehension, reading, and writing was assessed and found intact. Attention span and concentration were normal. Recent and remote memory were intact. Fund of Knowledge was assessed and was intact.  Cranial Nerves II - XII - The patient reports a change in her taste sensitivity ever since undergoing chemotherapy. A metallic aftertaste. Chemotherapy concluded a 16 month ago. Her ability to smell is unchanged. Visual field intact OU.Extraocular movements intact. Facial sensation intact bilaterally.Facial movement intact bilaterally. Hearing & vestibular intact bilaterally. No lateralization by Talbert Nan .   Palate elevates symmetrically. Tongue midline. Chin turning & shoulder shrug intact bilaterally.  Motor Strength - The patient's strength was normal in all extremities and pronator drift was absent.  Bulk was normal and fasciculations were absent.    Muscle tone was normal.  Reflexes - The patient's reflexes were normal in all extremities .  Sensory - Light touch, temperature/pinprick, vibration and proprioception, and Romberg testing were assessed and were normal.     Imaging  I have personally reviewed the radiological images below and agree with the radiology interpretations.  MRI head without contrast 10/2012 Bilateral supratentorial subcentimeter foci of white matter signal abnormality; see discussion  above. No acute intracranial abnormality.  MRI with and without contrast 09/10/15 1. Scattered periventricular and subcortical foci of T2 hyperintensities. No abnormal lesions are seen on post contrast views. These findings are non-specific and considerations include autoimmune, inflammatory, post-infectious, microvascular ischemic or migraine associated etiologies. 2. Normal pituitary gland. No acute findings. 3. Compared to MRI on 11/15/12, there has been slight increase in white matter disease.  Lab Review 06/17/15 Creatinine 0.79, AST 25, ALT 31, WBC 5.6, hemoglobin 14, platelet 241, ESR 17, TSH 3.246, C3 143, C4 33, RF less than 10, ANA negative, serum protein electrophoresis within normal limits, cardiolipin antibodies negative, anti-double strand DNA negative, SSA, SSB, anti-Smith antibody, SCL 70 all negative.  08/16/2015, TSH 2.73, free T4 1.54   Component     Latest Ref Rng 09/06/2015  Cholesterol     125 - 200 mg/dL 216 (H)  Triglycerides     <150 mg/dL 160 (H)  HDL Cholesterol     >=46 mg/dL 58  Total CHOL/HDL Ratio     <=5.0 Ratio 3.7  VLDL     <  30 mg/dL 32 (H)  LDL (calc)     <130 mg/dL 126  TSH     0.350 - 4.500 uIU/mL 3.618   I was able to review the patient's original sleep study from 08-05-11 at the [facility] hardened sleep center , as interpreted by Dr. Shelva Majestic.  The patient had such mild apnea that a CPAP titration was not justified. She does have retrognathia and she reports been witnessed to snore. I think that her morning headaches may be related to hypoxemia at night also this was not found 4 years ago. She does have significant periodic limb movements and these correlates with her report of restless legs. It is the restless legs that delay her sleep onset. I would like for the patient to be treated for restless legs she has never taken restless leg medications also these were recommended after her sleep study. I would certainly consider to order a sleep  aid should the restless leg medication not allow her to go to sleep easier and maintain sleep better. In addition I would like to obtain a new sleep study but able to the sleep study after I have written for medication and treatment for restless legs.   Assessment:   In summary, Brooke Williams is a 70 y.o. female with PMH of lupus off plaquenil, endometrial cancer s/p hysterectomy and chemotherapy, lipoma s/p surgery, hypothyroidism, borderline OSA, and long-standing migraine headache presents constant frontal mild headache for the last 4 months without relief which is different from her previous migraine headache. She went to see endocrinologist for hypothyroidism, TSH and free T4 normal, ruled out other endocrine disorders, however recommend to have MRI done to rule out pituitary tumor due to complains of blurry vision. On exam, there is no visual field deficit, and fundi exam was normal. Headache most consistent with tension headache due to anxiety. MRI with and without contrast ruled out structural lesions.  I was able to review the patient's original sleep study from 08-05-11 at the [facility] hardened sleep center , as interpreted by Dr. Shelva Majestic.  The patient had such mild apnea that a CPAP titration was not justified. She does have retrognathia and she reports been witnessed to snore. I think that her morning headaches may be related to hypoxemia at night also this was not found 4 years ago. She does have significant periodic limb movements and these correlates with her report of restless legs. It is the restless legs that delay her sleep onset. I would like for the patient to be treated for restless legs she has never taken restless leg medications also these were recommended after her sleep study. I would certainly consider to order a sleep aid should the restless leg medication not allow her to go to sleep easier and maintain sleep better. In addition I would like to obtain a new sleep study but  able to the sleep study after I have written for medication and treatment for restless legs. The patient reports that she had terrible iron deficiency and anemia even as a teenager. With a history of endometrial cancer and chemotherapy I can imagine that her iron deficiency has been exacerbated. And this would correlate with a worsening of her restless leg symptoms as she describes a timeline.    Plan: -The patient will start Mirapex at a very low-dose at least one hour before intended bedtime.The patient has no longer constant restless moving legs , improved  Iron deficiency anemia can be contributing to this change after starting Prenatal  iron   She overcame a paralytic ileus in dec 2016 , which precipitated the onset of symptoms and the reduce stool frequency on days of iron intake has been evident to her. For this reason she will only take it every other day.anemia can be a cause of restless leg syndrome.  She has been sleeping well on Oxygen 2 liters per nasal canula. Will investigate travel options for oxygen. I would like for the patient to have a smaller ambulatory oxygen tank available but she can trouble with that she may be able to refill at different locations even if she doesn't use it every night and travels. [facility] has also assured her that they can deliver an oxygen concentrator to her mom's house in [place] for the duration she visits. She underwent a Knee replacement on March 6 th. Left side.   I spent more than 25 minutes of face to face time with the patient. Greater than 50% of time was spent in counseling and coordination of care.  Rv in 47 month    [place], MD  [facility] [address] [place],  [address] [phone]

## 2016-05-19 NOTE — Telephone Encounter (Signed)
[facility] called regarding order for oxygen tank to travel with, states patient is not on continuous oxygen, she doesn't have a tank. Please call [phone].

## 2016-05-19 NOTE — Telephone Encounter (Signed)
Per [facility], pt is not eligible for a portable tank of oxygen since she is only on nocturnal oxygen. Per [facility], pt has no other alternatives, except to have the oxygen machine delivered to where she is travelling to.

## 2016-05-19 NOTE — Telephone Encounter (Signed)
I called Louis at [facility] back. She reports that pt has the smallest machine available at this time. A portable o2 concentrator is not available for nocturnal use. However, [facility] can have a oxygen machine delivered to wherever she is travelling to, if pt gives them at least a 2 week notice.  Manuela Schwartz at [facility] will call pt and explain this to her.

## 2016-05-19 NOTE — Telephone Encounter (Signed)
The patient will need a portable bottle or portable tank of oxygen not a portable oxygen concentrator.

## 2016-09-11 ENCOUNTER — Other Ambulatory Visit: Payer: Self-pay | Admitting: Obstetrics and Gynecology

## 2016-09-11 DIAGNOSIS — Z1231 Encounter for screening mammogram for malignant neoplasm of breast: Secondary | ICD-10-CM

## 2016-09-23 ENCOUNTER — Ambulatory Visit
Admission: RE | Admit: 2016-09-23 | Discharge: 2016-09-23 | Disposition: A | Discharge status: Home / Self Care | Payer: Medicare Other | Source: Ambulatory Visit | Attending: Obstetrics and Gynecology | Admitting: Obstetrics and Gynecology

## 2016-09-23 DIAGNOSIS — Z1231 Encounter for screening mammogram for malignant neoplasm of breast: Secondary | ICD-10-CM

## 2016-09-24 ENCOUNTER — Encounter: Payer: Self-pay | Admitting: Rheumatology

## 2016-09-24 ENCOUNTER — Ambulatory Visit (INDEPENDENT_AMBULATORY_CARE_PROVIDER_SITE_OTHER): Payer: Medicare Other | Admitting: Rheumatology

## 2016-09-24 ENCOUNTER — Inpatient Hospital Stay (INDEPENDENT_AMBULATORY_CARE_PROVIDER_SITE_OTHER): Payer: Medicare Other

## 2016-09-24 VITALS — BP 109/72 | HR 108 | Resp 12 | Ht 66.5 in | Wt 168.0 lb

## 2016-09-24 DIAGNOSIS — R768 Other specified abnormal immunological findings in serum: Secondary | ICD-10-CM

## 2016-09-24 DIAGNOSIS — M19042 Primary osteoarthritis, left hand: Secondary | ICD-10-CM | POA: Diagnosis not present

## 2016-09-24 DIAGNOSIS — M19041 Primary osteoarthritis, right hand: Secondary | ICD-10-CM | POA: Diagnosis not present

## 2016-09-24 DIAGNOSIS — E559 Vitamin D deficiency, unspecified: Secondary | ICD-10-CM | POA: Diagnosis not present

## 2016-09-24 DIAGNOSIS — R2232 Localized swelling, mass and lump, left upper limb: Secondary | ICD-10-CM

## 2016-09-24 DIAGNOSIS — M47812 Spondylosis without myelopathy or radiculopathy, cervical region: Secondary | ICD-10-CM | POA: Insufficient documentation

## 2016-09-24 DIAGNOSIS — M5030 Other cervical disc degeneration, unspecified cervical region: Secondary | ICD-10-CM

## 2016-09-24 DIAGNOSIS — M170 Bilateral primary osteoarthritis of knee: Secondary | ICD-10-CM | POA: Diagnosis not present

## 2016-09-24 DIAGNOSIS — M8589 Other specified disorders of bone density and structure, multiple sites: Secondary | ICD-10-CM

## 2016-09-24 NOTE — Progress Notes (Signed)
*IMAGE* Office Visit Note  Patient: Brooke Williams             Date of Birth: 1946/10/27           MRN: [id number]             PCP: Stephens Shire, MD Referring: Stephens Shire, MD Visit Date: 09/24/2016    Subjective:  Follow-up   History of Present Illness: Brooke Williams is a 70 y.o. female withhistory of osteoarthritis. She returns after her last visit in October 2016. She states in November 2016 she fell and fractured bones around her knee joint. In March 2017 she underwent total knee replacement by Dr. Mayer Camel and has recovered very well. She complains of extreme fatigue even after using oxygen at night.She states she had negative sleep study but was desaturating at nighttime. she complains of the stiffness in her hands and are knot on her left fourth PIP joint. She also complains of some lower back discomfort at the site of lipoma resection.  Activities of Daily Living:  Patient reports morning stiffness for 20 minutes.   Patient Reports nocturnal pain.  Difficulty dressing/grooming: Denies Difficulty climbing stairs: Denies Difficulty getting out of chair: Denies Difficulty using hands for taps, buttons, cutlery, and/or writing: Denies   Review of Systems  Constitutional: Positive for fatigue and weight loss.  HENT: Positive for mouth sores and mouth dryness.   Eyes: Positive for dryness.  Respiratory: Negative.   Cardiovascular: Negative.   Gastrointestinal: Negative.   Endocrine: Negative.   Genitourinary: Negative.   Musculoskeletal: Positive for arthralgias, joint pain and morning stiffness.  Allergic/Immunologic: Negative.   Neurological: Positive for headaches (tension).  Hematological: Negative.   Psychiatric/Behavioral: Positive for sleep disturbance (improved with oxygen).    PMFS History:  Patient Active Problem List   Diagnosis Date Noted  . Primary osteoarthritis of both knees 09/24/2016  . Primary osteoarthritis of both hands 09/24/2016  . DJD  (degenerative joint disease), cervical 09/24/2016  . Osteopenia of multiple sites 09/24/2016  . Vitamin D deficiency 09/24/2016  . Positive ANA (antinuclear antibody) 09/24/2016  . Recurrent aphthous stomatitis 02/04/2016  . Benign neoplasm of skin of trunk 02/04/2016  . Loose total knee arthroplasty ([place]) Left 02/01/2016  . Chronic respiratory failure with hypoxia (HCC)/ noct hypoxemia 11/21/2015  . OSA (obstructive sleep apnea) 10/16/2015  . Chronic tension-type headache, not intractable 10/16/2015  . Atypical chest pain 08/28/2015  . Vasomotor flushing 07/05/2014  . Adult hypothyroidism 02/05/2014  . Hyperlipidemia 10/10/2013  . Endometrial ca ([place]) 10/10/2013  . Family history of premature CAD 10/10/2013  . Endometrial adenocarcinoma ([place]) 12/01/2012  . HEMORRHOIDS, INTERNAL 09/11/2010  . Migraine headache 03/19/2010  . ESOPHAGEAL REFLUX 03/19/2010  . FULL INCONTINENCE OF FECES 03/19/2010    Past Medical History:  Diagnosis Date  . Arthritis    arthritis-hands. cervical disc degeneration  . Back pain    6th vertebrae has collapse on 7th,pinched nerve feeling occ  . Dizziness    rarely,takes Meclizine if needed  . Endometrial cancer ([place]) 2014  . GERD (gastroesophageal reflux disease)    controls with Zantac  . Headache(784.0)    headaches frequently but states since placed on 2L/m via N/C headaches have improved  . History of colon polyps    benign  . History of staph infection   . Hyperlipidemia    takes krill oil daily  . Hypothyroidism    takes Synthroid daily  . Ileus ([place]) 10/2015   was treated  . Joint  pain   . Joint swelling   . Lupus    remission since 2008  . Nocturia   . OSA (obstructive sleep apnea)    sleep study in epic from 11-01-15.Uses Oxygen  . Pneumonia    hx of-within the last 15 yrs  . PONV (postoperative nausea and vomiting)   . Restless leg syndrome    takes Mirapex nightly  . Seasonal allergies    takes Allegra daily and uses Astelin  Nasal Spray as needed    Family History  Problem Relation Age of Onset  . Stroke Mother 60    arterial wall of artery in eye   . Endometrial cancer Mother 77  . Dementia Father   . Stroke Father     "lots of many strokes"  . Aneurysm Father 54    AAA  . Multiple births Maternal Grandmother     died 5 days after delivery of unknown causes  . Colon cancer Maternal Aunt     late 71s  . Cancer Cousin     maternal cousin with an unknown form of cancer   Past Surgical History:  Procedure Laterality Date  . ABDOMINAL HYSTERECTOMY    . COLONOSCOPY    . DILATION AND CURETTAGE OF UTERUS     2008-polyp removed  . HAND SURGERY  2006   left thumb surgery with pin  . lipomas removed from back   40 yrs ago  . MASS EXCISION N/A 10/05/2014   Procedure: EXCISION OF BILATERAL LOWER BACK MASSES;  Surgeon: Coralie Keens, MD;  Location: [facility];  Service: General;  Laterality: N/A;  . MEDIAL PARTIAL KNEE REPLACEMENT  2005   LT knee  . OTHER SURGICAL HISTORY     metal pin/screw to repair arthritis in LT thumb joint  . ROBOTIC ASSISTED TOTAL HYSTERECTOMY WITH BILATERAL SALPINGO OOPHERECTOMY  12/20/2012   Procedure: ROBOTIC ASSISTED TOTAL HYSTERECTOMY WITH BILATERAL SALPINGO OOPHORECTOMY;  Surgeon: Imagene Gurney A. Alycia Rossetti, MD;  Location: [facility];  Service: Gynecology;  Laterality: N/A;  WITH PELVIC PARAAORTIC   . ROBOTIC PELVIC AND PARA-AORTIC LYMPH NODE DISSECTION  12/20/2012   Procedure: ROBOTIC PELVIC AND PARA-AORTIC LYMPH NODE DISSECTION;  Surgeon: Imagene Gurney A. Alycia Rossetti, MD;  Location: [facility];  Service: Gynecology;  Laterality: Bilateral;  . TOTAL KNEE REVISION Left 02/03/2016   Procedure: TOTAL KNEE REVISION;  Surgeon: Frederik Pear, MD;  Location: [facility];  Service: Orthopedics;  Laterality: Left;   Social History   Social History Narrative   Patient does not have any biological children but has step children     Objective: Vital Signs: BP 109/72 (BP Location: Left Arm, Patient Position:  Sitting, Cuff Size: Large)   Pulse (!) 108   Resp 12   Ht 5' 6.5" (1.689 m)   Wt 168 lb (76.2 kg)   BMI 26.71 kg/m    Physical Exam  Constitutional: She is oriented to person, place, and time. She appears well-developed and well-nourished.  HENT:  Head: Normocephalic and atraumatic.  Eyes: Conjunctivae and EOM are normal. Pupils are equal, round, and reactive to light.  Neck: Normal range of motion.  Cardiovascular: Normal rate, regular rhythm, normal heart sounds and intact distal pulses.   Pulmonary/Chest: Effort normal and breath sounds normal.  Abdominal: Soft. Bowel sounds are normal.  Neurological: She is alert and oriented to person, place, and time.  Skin: Skin is warm and dry. Capillary refill takes more than 3 seconds.  Psychiatric: She has a normal mood and affect. Her  behavior is normal.     Musculoskeletal Exam: C-spine, thoracic spine, lumbar spine was good range of motion she has no SI joint tenderness. Shoulder joints, elbow joints, wrist joints, MCPs with good range of motion. She had thickening of bilateral PIP and DIP joints consistent with osteoarthritis. She had good range of motion of her hip joints and knee joints ankles MTPs PIPs DIPs her left total knee replacement is doing well except that she has decreased extension. No synovitis was noted on examination today.  CDAI Exam: No CDAI exam completed.    Investigation: Findings:  September 2017 labs from [facility] comprehensive metabolic panel and CBC were normal. She also brought CT scan of her chest and abdomen report which showed stable thyroid nodule and a stable calcification and hepatic dome area and    Imaging: [place] Extrem Up Left Ltd  Result Date: 09/24/2016 Ultrasound examination of the left fourth digit nodule was performed per EULAR recommendation. Using 8 MHz transducer grayscale and power Doppler the nodule was visualized. Hypo-genic density was noted with no Doppler activity, it was not compressible.  Impression: Noncompressible hypo-genic mass noted over left fourth finger.  Mm Screening Breast Tomo Bilateral  Result Date: 09/24/2016 CLINICAL DATA:  Screening. EXAM: 2D DIGITAL SCREENING BILATERAL MAMMOGRAM WITH CAD AND ADJUNCT TOMO COMPARISON:  Previous exam(s). ACR Breast Density Category c: The breast tissue is heterogeneously dense, which may obscure small masses. FINDINGS: There are no findings suspicious for malignancy. Images were processed with CAD. IMPRESSION: No mammographic evidence of malignancy. A result letter of this screening mammogram will be mailed directly to the patient. RECOMMENDATION: Screening mammogram in one year. (Code:SM-B-01Y) BI-RADS CATEGORY  1: Negative. Electronically Signed   By: Franki Cabot M.D.   On: 09/24/2016 13:45    Speciality Comments: No specialty comments available.    Procedures:  No procedures performed Allergies: Cefdinir; Penicillins; Codeine; Penicillamine; and Tape   Assessment / Plan: Visit Diagnoses: Primary osteoarthritis of both knees - LTKR: She is recovering very well from her total knee replacement and denies any discomfort.  Primary osteoarthritis of both hands: She has ongoing discomfort and stiffness in her bilateral hands. Joint protection and muscle strengthening was discussed. Head and muscle strengthening exercises were demonstrated in the office. Natural anti-inflammatories were discussed.  DJD (degenerative joint disease), cervical: She has a stiffness for which range of motion exercises were discussed.  Osteopenia : She has not had a bone density and a long time I'll schedule DEXA.  Vitamin D deficiency: She has history of vitamin D deficiency I'll check vitamin D levels today.  Positive ANA (antinuclear antibody) - history of, negative later. She is complaining of photosensitivity, oral ulcers, fatigue I will check ANA, double-stranded DNA, complements today. I'll contact her with the results once available.  Left  fourth finger nodule: The ultrasound demonstrated a solid nodule. I'll refer her to hand surgery for evaluation and possible resection.  No problem-specific Assessment & Plan notes found for this encounter.   Follow-Up Instructions: Return in about 1 year (around 09/24/2017) for Osteoarthritis.  Orders: Orders Placed This Encounter  Procedures  . DG DXA FRACTURE ASSESSMENT  . [place] Extrem Up Left Ltd  . ANA  . Anti-DNA antibody, double-stranded  . C3 and C4  . Sjogrens syndrome-A extractable nuclear antibody  . VITAMIN D 25 Hydroxy (Vit-D Deficiency, Fractures)  . Ambulatory referral to Orthopedic Surgery

## 2016-09-25 LAB — SJOGRENS SYNDROME-A EXTRACTABLE NUCLEAR ANTIBODY: SSA (Ro) (ENA) Antibody, IgG: <1

## 2016-09-25 LAB — C3 AND C4
C3 Complement: 150 mg/dL (ref 90–180)
C4 Complement: 32 mg/dL (ref 16–47)

## 2016-09-25 LAB — VITAMIN D 25 HYDROXY (VIT D DEFICIENCY, FRACTURES): Vit D, 25-Hydroxy: 38 ng/mL (ref 30–100)

## 2016-09-25 LAB — ANA: Anti Nuclear Antibody(ANA): NEGATIVE

## 2016-09-25 LAB — ANTI-DNA ANTIBODY, DOUBLE-STRANDED: ds DNA Ab: 1 IU/mL

## 2016-09-30 ENCOUNTER — Ambulatory Visit (INDEPENDENT_AMBULATORY_CARE_PROVIDER_SITE_OTHER): Payer: Medicare Other | Admitting: Internal Medicine

## 2016-09-30 ENCOUNTER — Encounter: Payer: Self-pay | Admitting: Internal Medicine

## 2016-09-30 VITALS — BP 126/72 | HR 105 | Ht 66.5 in | Wt 169.4 lb

## 2016-09-30 DIAGNOSIS — J9611 Chronic respiratory failure with hypoxia: Secondary | ICD-10-CM | POA: Diagnosis not present

## 2016-09-30 NOTE — Patient Instructions (Signed)
You should continue 02 at 2lpm indefinitely  Continue to exercise regularly and let me know if you are losing ground in terms of exercise tolerance at any point  Pulmonary follow up is as needed

## 2016-09-30 NOTE — Progress Notes (Signed)
Subjective:    Patient ID: Charlita Winkle, female    DOB: [birth year],   MRN: [id number]   Brief patient profile:   7 yowf never smoker with dx of sle 1996 rx plaquenil stopped in 2008 and no recurrence underwent sleep eval for freq disruption and fatigue since June 2016 and neg PSG 11/01/15  except for restless legs and decreased sats to a nadir of 88% so referred to pulmonary clinic 11/21/2015 by Dr Dohmeier       History of Present Illness  11/21/2015 1st [facility] office visit/ Abimelec Grochowski   Chief Complaint  Patient presents with  . PULMONARY CONSULT    Referred by Dr. Brett Fairy. Pt c/o waking though the night, waking with headaches in the mornings, excessive fatigue and daytime somnolence. Pt has had sleep study done that showed nocturnal hypoxia.   finished chemo for endometrial ca July 2015 taxol and carboplat  And not aware of any sign doe/ cough hs or any other time  rec ono RA 11/26/15 desat x 66min 32 sec rec 2lpm > much better noct symptoms   09/30/2016  f/u ov/Sieanna Vanstone re: noct hypoxemia Chief Complaint  Patient presents with  . Follow-up    pt doing well, no complaints today.  pt scheduled appt because she is needing 02 qualification sats per DME company.  Pt only wears 02 qhs.     wearing 02 2lpm q hs/ sleeping better, wakes up feeling refreshed vs without it and no HA  Riding ex bike up to 20 min s sob    No obvious day to day or daytime variability or assoc excess/ purulent sputum or mucus plugs or hemoptysis or cp or chest tightness, subjective wheeze or overt sinus or hb symptoms. No unusual exp hx or h/o childhood pna/ asthma or knowledge of premature birth.  Sleeping ok without nocturnal  or early am exacerbation  of respiratory  c/o's or need for noct saba. Also denies any obvious fluctuation of symptoms with weather or environmental changes or other aggravating or alleviating factors except as outlined above   Current Medications, Allergies, Complete Past Medical  History, Past Surgical History, Family History, and Social History were reviewed in [employer] record.  ROS  The following are not active complaints unless bolded sore throat, dysphagia, dental problems, itching, sneezing,  nasal congestion or excess/ purulent secretions, ear ache,   fever, chills, sweats, unintended wt loss, classically pleuritic or exertional cp,  orthopnea pnd or leg swelling, presyncope, palpitations, abdominal pain, anorexia, nausea, vomiting, diarrhea  or change in bowel or bladder habits, change in stools or urine, dysuria,hematuria,  rash, arthralgias, visual complaints, headache, numbness, weakness or ataxia or problems with walking or coordination,  change in mood/affect or memory.                      Objective:   Physical Exam  amb wf nad   09/30/2016        169   11/21/15 176 lb 9.6 oz (80.105 kg)  11/19/15 174 lb (78.926 kg)  10/28/15 179 lb (81.194 kg)    Vital signs reviewed - - Note on arrival 02 sats  95 % on RA     HEENT: nl dentition, turbinates, and oropharynx. Nl external ear canals without cough reflex   NECK :  without JVD/Nodes/TM/ nl carotid upstrokes bilaterally   LUNGS: no acc muscle use,  Nl contour chest which is clear to A and P bilaterally without cough  on insp or exp maneuvers   CV:  RRR  no s3 or murmur or increase in P2, no edema   ABD:  soft and nontender with nl inspiratory excursion in the supine position. No bruits or organomegaly, bowel sounds nl  MS:  Nl gait/ ext warm without deformities, calf tenderness, cyanosis or clubbing No obvious joint restrictions   SKIN: warm and dry without lesions    NEURO:  alert, approp, nl sensorium with  no motor deficits                   Assessment & Plan:

## 2016-10-01 NOTE — Assessment & Plan Note (Signed)
See PSG 12//2/16  37 min desats but nadir ws 88%  - 11/21/2015  Walked RA x 3 laps @ 185 ft each stopped due to  End of study, nl pace, no sob or desat   - ONO RA 11/26/2015 :  desat < 89% x  5 m 32 sec >  rec 2lpm and repeat on 2lpm > done 12/03/15 and no desat so rec continue 2lpm hs only   Wearing 02 and now sleeping well, so clearly benefiting , no am ha > no change rx needed

## 2016-10-12 ENCOUNTER — Telehealth: Payer: Self-pay | Admitting: Rheumatology

## 2016-10-12 NOTE — Telephone Encounter (Signed)
Patient would like lab results sent to Dr. Alycia Rossetti at [facility]. Fax# [phone]

## 2016-10-12 NOTE — Telephone Encounter (Signed)
Called patient to advise labs WNL Dr Alycia Rossetti can access these/ care every where. Left message.

## 2016-10-12 NOTE — Telephone Encounter (Signed)
Patient would like her lab results

## 2016-10-27 ENCOUNTER — Other Ambulatory Visit: Payer: Self-pay | Admitting: Radiology

## 2016-10-27 DIAGNOSIS — E559 Vitamin D deficiency, unspecified: Secondary | ICD-10-CM

## 2016-10-27 DIAGNOSIS — M8589 Other specified disorders of bone density and structure, multiple sites: Secondary | ICD-10-CM

## 2016-12-02 ENCOUNTER — Ambulatory Visit: Payer: Medicare Other | Admitting: Diagnostic Neuroimaging

## 2017-04-19 ENCOUNTER — Telehealth: Payer: Self-pay | Admitting: *Deleted

## 2017-04-20 ENCOUNTER — Telehealth: Payer: Self-pay | Admitting: Neurology

## 2017-04-20 NOTE — Telephone Encounter (Signed)
Left vm for patient to call back explaining headache. Pt last seen 2016 and can see any NP for headache. Please schedule with the NP, or Dr. Erlinda Hong whoever is available first.

## 2017-04-20 NOTE — Telephone Encounter (Signed)
Pt called  Stating she has been having sever headaches since a fall Thanksgiving of 2017, she accepted an appointment with NP Hoyle Sauer. On 06-09-2017

## 2017-04-20 NOTE — Telephone Encounter (Signed)
Rn call patient about her headaches becoming more frequent. Pt last seen 10/2015. Pt never follow up after last appt. Pt was about to explain and phone went dead.

## 2017-04-21 ENCOUNTER — Other Ambulatory Visit: Payer: Self-pay | Admitting: Physician Assistant

## 2017-04-21 ENCOUNTER — Ambulatory Visit
Admission: RE | Admit: 2017-04-21 | Discharge: 2017-04-21 | Disposition: A | Discharge status: Home / Self Care | Payer: Medicare Other | Source: Ambulatory Visit | Attending: Physician Assistant | Admitting: Physician Assistant

## 2017-04-21 DIAGNOSIS — W19XXXA Unspecified fall, initial encounter: Secondary | ICD-10-CM

## 2017-04-21 DIAGNOSIS — R519 Headache, unspecified: Secondary | ICD-10-CM

## 2017-04-21 DIAGNOSIS — R51 Headache: Principal | ICD-10-CM

## 2017-04-28 ENCOUNTER — Other Ambulatory Visit: Payer: Self-pay | Admitting: Orthopedic Surgery

## 2017-04-28 DIAGNOSIS — M5412 Radiculopathy, cervical region: Secondary | ICD-10-CM

## 2017-04-28 DIAGNOSIS — M546 Pain in thoracic spine: Secondary | ICD-10-CM

## 2017-05-07 ENCOUNTER — Ambulatory Visit
Admission: RE | Admit: 2017-05-07 | Discharge: 2017-05-07 | Disposition: A | Discharge status: Home / Self Care | Payer: Medicare Other | Source: Ambulatory Visit | Attending: Orthopedic Surgery | Admitting: Orthopedic Surgery

## 2017-05-07 DIAGNOSIS — M5412 Radiculopathy, cervical region: Secondary | ICD-10-CM

## 2017-05-07 DIAGNOSIS — M546 Pain in thoracic spine: Secondary | ICD-10-CM

## 2017-05-19 ENCOUNTER — Ambulatory Visit: Payer: Medicare Other | Admitting: Neurology

## 2017-06-09 ENCOUNTER — Ambulatory Visit: Payer: Medicare Other | Admitting: Nurse Practitioner

## 2017-06-29 ENCOUNTER — Ambulatory Visit (INDEPENDENT_AMBULATORY_CARE_PROVIDER_SITE_OTHER): Payer: Medicare Other | Admitting: Neurology

## 2017-06-29 ENCOUNTER — Encounter: Payer: Self-pay | Admitting: Neurology

## 2017-06-29 VITALS — BP 102/65 | HR 66 | Ht 67.0 in | Wt 169.5 lb

## 2017-06-29 DIAGNOSIS — G4734 Idiopathic sleep related nonobstructive alveolar hypoventilation: Secondary | ICD-10-CM | POA: Diagnosis not present

## 2017-06-29 NOTE — Patient Instructions (Signed)

## 2017-06-29 NOTE — Progress Notes (Signed)
NEUROLOGY CLINIC FOLLOW UP PATIENT NOTE  NAME: Brooke Williams DOB: 12-04-1945  History summary:  Brooke Williams is a 71 y.o. female with PMH of lupus, endometrial cancer s/p hysterectomy and chemotherapy, lipoma s/p surgery who presents as a new patient for Sleep apnea,  [name] is a new patient to our sleep clinic, she moved from [place] 15 years ago to [place]. She underwent a sleep study at the [facility] hardened sleep center where she was followed by Dr. Ellouise Newer her cardiologist. The study is dated 08-05-11 and was ordered upon the patient's report of excessive daytime sleepiness, waking up with headaches in the morning and feeling that her sleep was not restorative or refreshing. She had endorsed the Becks depression inventory at 16 points at that time and the Epworth scale at 12 points. She also had been told that she was a loud snorer and sometimes had difficulties falling asleep while still feeling very sleepy. The sleep problem had already persisted for longer than 2 years at the time that she was tested. Her sleep study had revealed an AHI of only 4.3 and during REM sleep at 13.1. The RDI which includes arousals from snoring and not just apneas, was 17.9 and during REM sleep increased to 39. The patient had extremely mild sleep apnea but a moderate to severe degree of upper airway resistance. She also support during her sleep study. She had significant periodic limb movements and Dr. Claiborne Billings reported 28.5 per hour were causing arousals which would be a more severe sleep disorder in her apnea was.  In the meantime the patient states that her restless legs which were present 4 years ago have progressed to a more severe degree. She goes to bed between 10 and 11 PM and usually falls asleep within 1 hour, some nights it may take 2 hours. As soon as she retreats to bed and watches television for example she gets the symptoms of restless legs. She wakes up routinely about  every 90 minutes during the night and sometimes she will go to the bathroom but it was not the bathroom call that woke her. She will rise in the morning at about 7 AM spontaneously. She remains and that for another 1-1/2 hours or so before she has the energy to get up. She still feels her sleep is not restorative or refreshing. She wakes frequently up with a headache, her headaches are there all the time but she reports the most severe part of the day in the morning. The headaches have not woken her but she wakes up with the headaches and is aware of them. He will also often go to bed with some headaches. The headaches are described as a pressure to the for head. There is no retro-orbital pressure and it does not temporal. It is does not prefer one side versus the other. She sometimes gets nauseated with her headaches lasting all day. There is a dizziness with headaches, feeling swimmy headed.  She sleeps on her left, avoids supine sleep. The bedroom is cool, quiet and dark.   Dr Phoebe Sharps note from October : Patient stated that she was diagnosed with endometrial cancer and had robotic hysterectomy in January 2014. However, in February 2015, she was found to have recurrent focal soft tissue tumor at the site of hysterectomy, consider endometrial cancer recurrence, she received chemotherapy for about 6 months. 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 Had a some of the  lipoma removed.   She has long-standing migraine headache for more than 30 years. The headache involves whole head, bandage like, 5-10/10 severity, associated with photophobia, no phonophobia or nausea vomiting. It happens several times a week, lasting 2 days, she has to lie down to help the headache. No identifiable triggers. She has tried more than 20 different medications, his symptoms only Robaxin and later gabapentin helped to take the edge off the pain. Since 2013, the headache getting worse  both in severity and the frequency. However, for the last 4 months, the headaches changed their pattern, became frontal headache currently, pressure type headache, constant, never went away, associate with joint ice, nose irritation, fatigue all day long. Gabapentin does not work this time, also tried icing, rubbing, topical cream, nothing worked. Also has intermittent blurry vision, she is going to see an eye doctor tomorrow.  She has history of a hypothyroidism, on Synthroid supplement. She went to see endocrinologist, checked TSH and T4 within normal limits, and also ruled out other endocrine disorders. However she was advised to have MRI done to rule out pituitary tumor due to complaints of blurred vision. She also follows with rheumatology for her lupus, she has had lupus since 2008, has been treated with plaqenil. Currently off the medication last oncology visit in July 2016 showed normal C3, C4, RF, ESR, negative ANA and Cardiolipin antibodies.  She has sleep study over 4  years ago was told that the treatment would require CPAP< OSA, but she did not  qualify for CPAP machine. However, she has constant insomnia, usually spend an hour before falling asleep, and able to sleep 2 hours longest. She denies any smoking, alcohol drinking, or illicit drugs.  11-19-15 . I'm meeting today with Mrs. Sabino Dick regarding the recent polysomnography results. The patient suffered a paralytic ileus on December 4 and has correlated the lower stool frequency with the days of iron intake. I have requested her to take this equal iron supplement but she will only take it from now on every other day. Her sleep study did not reveal any apnea or shallow breathing. She had 34 minutes of lower oxygen saturation throughout the whole night study no evidence for CO2 retention. There was very little sleep actually recorded only 167 minutes of sleep were present during the sleep study. Her sleep seem to be fragmented by periodic limb  movements also these seem not to cause directly arousals. This could be a pattern of restless leg syndrome manifestation. This is often associated with iron deficiency anemia. The patient will also continue at a lower frequency to take the iron supplement pharmacotherapy will be discussed today and I would like for her to undergo pulmonary function testing with a pulmonologist given that she is an endometrial cancer survivor and may have had chemotherapy related changes.  05-19-2016, Mrs. Sabino Dick had been started on nocturnal oxygen after seeing Dr. Melvyn Novas in the pulmonary section. She feels that it has improved her sleep. She has an oxygen concentrator available at home but she asked me today what she can do when she travels. The couple is mobile and likes to day at different hotels and having a concentrator delivered to one location would not help her necessarily in maintaining her style of holiday. I suggested that we should look into a portable small tank that may be exchanged or refilled at different locations during travel. I'm also thinking that vitamin improve her sleep it is not  causing pulmonary or cardiac damage should she not  have it available for a couple of nights. She feels more refreshed and restored after a night with oxygen availability. She also relates to me today that her restless legs have improved she has changed Mirapex from taking it nightly to taking it every other night because it made her dizzy in the morning hours. However it has improved her sleep and had an effect on the restless leg syndrome. In addition she started a multivitamin prenatal composition which contains iron and this also seems to have helped her. She looks a lot better rested today.  06-29-2017, I have pleasure of seeing Mrs. Delynn Flavin today for his yearly revisit, she looks very well today, she had a extensive workup in the last 2 years including pulmonary function tests, MRIs, treadmill tests. She still had  some difficulties with sleep last year but she is not excessively daytime sleepy or fatigued.RLS medication with dopamine was interfering with her thyroid medication. Lightheadedness resolved. She has been off dopamine for 6 month , she sleeps better and remains on nocturnal on oxygen.  She cannot travel with her huge concentrator. She read about a travel concentrator. Will ask Dr Sherene Sires.  She continues to take calcium and magnesium daily- she takes prenatal vitamins.  She endorsed the Epworth score at 4 points the fatigue severity score at 19 points and the geriatric depression score at 0 out of 15 points.    Interval history: During the interval time, pt stated that she has been doing the same. HA still there, sometimes pounding on the waking up in the morning. When she had her migraine in the past, she found only robaxin PRN was helping some. She has not used any robaxin yet. She had treadmill test and bone scan were both unremarkable. Her MRI with and without contrast showed no pituitary tumor. She still has difficulty with sleep and snores with bad quality of sleep.  Epworth and Depression score, fatigue score were quoted above.    Past Medical History:  Diagnosis Date  . Arthritis    arthritis-hands. cervical disc degeneration  . Back pain    6th vertebrae has collapse on 7th,pinched nerve feeling occ  . Dizziness    rarely,takes Meclizine if needed  . Endometrial cancer (HCC) 2014  . GERD (gastroesophageal reflux disease)    controls with Zantac  . Headache(784.0)    headaches frequently but states since placed on 2L/m via N/C headaches have improved  . History of colon polyps    benign  . History of staph infection   . Hyperlipidemia    takes krill oil daily  . Hypothyroidism    takes Synthroid daily  . Ileus (HCC) 10/2015   was treated  . Joint pain   . Joint swelling   . Lupus    remission since 2008  . Nocturia   . OSA (obstructive sleep apnea)    sleep study in epic  from 11-01-15.Uses Oxygen  . Pneumonia    hx of-within the last 15 yrs  . PONV (postoperative nausea and vomiting)   . Restless leg syndrome    takes Mirapex nightly  . Seasonal allergies    takes Allegra daily and uses Astelin Nasal Spray as needed   Past Surgical History:  Procedure Laterality Date  . ABDOMINAL HYSTERECTOMY    . COLONOSCOPY    . DILATION AND CURETTAGE OF UTERUS     2008-polyp removed  . HAND SURGERY  2006   left thumb surgery with pin  . lipomas removed from  back   40 yrs ago  . MASS EXCISION N/A 10/05/2014   Procedure: EXCISION OF BILATERAL LOWER BACK MASSES;  Surgeon: Coralie Keens, MD;  Location: [facility];  Service: General;  Laterality: N/A;  . MEDIAL PARTIAL KNEE REPLACEMENT  2005   LT knee  . OTHER SURGICAL HISTORY     metal pin/screw to repair arthritis in LT thumb joint  . ROBOTIC ASSISTED TOTAL HYSTERECTOMY WITH BILATERAL SALPINGO OOPHERECTOMY  12/20/2012   Procedure: ROBOTIC ASSISTED TOTAL HYSTERECTOMY WITH BILATERAL SALPINGO OOPHORECTOMY;  Surgeon: Imagene Gurney A. Alycia Rossetti, MD;  Location: [facility];  Service: Gynecology;  Laterality: N/A;  WITH PELVIC PARAAORTIC   . ROBOTIC PELVIC AND PARA-AORTIC LYMPH NODE DISSECTION  12/20/2012   Procedure: ROBOTIC PELVIC AND PARA-AORTIC LYMPH NODE DISSECTION;  Surgeon: Imagene Gurney A. Alycia Rossetti, MD;  Location: [facility];  Service: Gynecology;  Laterality: Bilateral;  . TOTAL KNEE REVISION Left 02/03/2016   Procedure: TOTAL KNEE REVISION;  Surgeon: Frederik Pear, MD;  Location: [place] [facility];  Service: Orthopedics;  Laterality: Left;   Family History  Problem Relation Age of Onset  . Stroke Mother 32       arterial wall of artery in eye   . Endometrial cancer Mother 73  . Dementia Father   . Stroke Father        "lots of many strokes"  . Aneurysm Father 88       AAA  . Multiple births Maternal Grandmother        died 5 days after delivery of unknown causes  . Colon cancer Maternal Aunt        late 68s  . Cancer Cousin         maternal cousin with an unknown form of cancer   Current Outpatient Prescriptions  Medication Sig Dispense Refill  . azelastine (ASTELIN) 0.1 % nasal spray Place 1 spray into both nostrils daily as needed for allergies.     Marland Kitchen BLACK COHOSH PO Take 800 mg by mouth daily.    . fexofenadine (ALLEGRA) 60 MG tablet Take 60 mg by mouth daily as needed for allergies or rhinitis.    . fluocinonide gel (LIDEX) 0.05 % APPLY TO AFFECTED AREA daily as needed  1  . ibuprofen (ADVIL,MOTRIN) 800 MG tablet Take 800 mg by mouth.    Astrid Drafts Omega-3 500 MG CAPS Take 2 capsules by mouth daily.    . magic mouthwash SOLN Swish and spit 10 mLs daily as needed for mouth pain.   11  . Multiple Vitamin (MULTIVITAMIN WITH MINERALS) TABS Take 1 tablet by mouth daily.    . pramipexole (MIRAPEX) 0.25 MG tablet Take 1 tablet (0.25 mg total) by mouth Nightly. 90 tablet 1  . Psyllium (METAMUCIL FIBER PO) Take by mouth.    . ranitidine (ZANTAC) 150 MG tablet Take 150 mg by mouth daily as needed for heartburn.     Marland Kitchen SYNTHROID 75 MCG tablet Take 75 mcg by mouth daily before breakfast.     . triamcinolone cream (KENALOG) 0.1 % Apply 1 application topically daily as needed (rash).      No current facility-administered medications for this visit.    Allergies  Allergen Reactions  . Cefdinir Hives, Itching and Rash  . Penicillins Hives    As a child. Has patient had a PCN reaction causing immediate rash, facial/tongue/throat swelling, SOB or lightheadedness with hypotension: No pt states she mostly had hives  Has patient had a PCN reaction occurring within the last 10 years:  Yes  If all of the above answers are "NO", then may proceed with Cephalosporin use.     . Codeine Itching and Nausea Only    Facial flushing  . Penicillamine Rash  . Tape Itching and Rash    Skin turns red   Social History   Social History  . Marital status: Married    Spouse name: N/A  . Number of children: 0  . Years of education: N/A    Occupational History  . Not on file.   Social History Main Topics  . Smoking status: Never Smoker  . Smokeless tobacco: Never Used  . Alcohol use Yes     Comment: occassional  . Drug use: No  . Sexual activity: Not Currently    Birth control/ protection: Post-menopausal   Other Topics Concern  . Not on file   Social History Narrative   Patient does not have any biological children but has step children    Review of Systems Full 14 system review of systems performed and notable only for those listed, all others are neg:  Constitutional:   Fatigue, EDS , morning headaches.   Physical Exam  Vitals:   06/29/17 1042  BP: 102/65  Pulse: 66    General - obese, well developed, in no apparent distress.   Cardiovascular - Regular rate and rhythm with no murmur. Carotid pulses were 2+ without bruits .   Neck - supple, no nuchal rigidity.  Neck circumference is 15-1/2 inches, Mallampati is grade 4 with a narrow and peaked palate. There is no redness or swelling of the upper airway noted. The patient has a slight retrognathia and a small lower jaw.    Mental Status -  Level of arousal and orientation to time, place, and person were intact. Language including expression, naming, repetition, comprehension, reading, and writing was assessed and found intact. Attention span and concentration were normal.  Cranial Nerves II - XII - A metallic aftertaste has resolved .Chemotherapy concluded a 36 month ago. Visual field intact OU.Extraocular movements intact. Facial sensation intact bilaterally.Facial movement intact bilaterally. Hearing & vestibular intact bilaterally. No lateralization by Rinne/ Weber .  Palate elevates symmetrically. Tongue midline. Chin turning & shoulder shrug intact bilaterally. Motor Strength - The patient's strength was normal in all extremities and pronator drift was absent.  Bulk was normal and fasciculations were absent.   Muscle tone was normal. Reflexes  - The patient's reflexes were normal in all extremities . Sensory - Light touch, temperature/pinprick, vibration were normal.       I was able to review the patient's original sleep study from 08-05-11 at the [facility] hardened sleep center , as interpreted by Dr. Shelva Majestic.  The patient had such mild apnea that a CPAP titration was not justified. She does have retrognathia and she reports been witnessed to snore. I think that her morning headaches may be related to hypoxemia at night also this was not found 4 years ago. She does have significant periodic limb movements and these correlates with her report of restless legs. It is the restless legs that delay her sleep onset. I would like for the patient to be treated for restless legs she has never taken restless leg medications also these were recommended after her sleep study. I would certainly consider to order a sleep aid should the restless leg medication not allow her to go to sleep easier and maintain sleep better. In addition I would like to obtain a new sleep study but able  to the sleep study after I have written for medication and treatment for restless legs.   Assessment:   In summary, Kathi Dohn is a 71 y.o. female with PMH of lupus off plaquenil, endometrial cancer s/p hysterectomy and chemotherapy, lipoma s/p surgery, hypothyroidism, borderline OSA, and long-standing migraine headache presents constant frontal mild headache for the last 4 months without relief which is different from her previous migraine headache. She went to see endocrinologist for hypothyroidism, TSH and free T4 normal, ruled out other endocrine disorders, however recommend to have MRI done to rule out pituitary tumor due to complains of blurry vision. On exam, there is no visual field deficit, and fundi exam was normal. Headache most consistent with tension headache due to anxiety. MRI with and without contrast ruled out structural lesions.  I was able to review  the patient's original sleep study from 08-05-11 at the [facility] hardened sleep center , as interpreted by Dr. Shelva Majestic.  The patient had such mild apnea that a CPAP titration was not justified. She does have retrognathia and she reports been witnessed to snore. I think that her morning headaches may be related to hypoxemia at night also this was not found 4 years ago. She does have significant periodic limb movements and these correlates with her report of restless legs. It is the restless legs that delay her sleep onset. I would like for the patient to be treated for restless legs she has never taken restless leg medications also these were recommended after her sleep study. I would certainly consider to order a sleep aid should the restless leg medication not allow her to go to sleep easier and maintain sleep better. In addition I would like to obtain a new sleep study but able to the sleep study after I have written for medication and treatment for restless legs. The patient reports that she had terrible iron deficiency and anemia even as a teenager. With a history of endometrial cancer and chemotherapy I can imagine that her iron deficiency has been exacerbated. And this would correlate with a worsening of her restless leg symptoms as she describes a timeline.    Plan: The patient has no longer constant restless moving legs , improved  Iron deficiency anemia can be contributing to this change after starting Prenatal iron   .anemia can be a cause of restless leg syndrome.  She had to discontinue her Mirapex as it was suspected to interfere with Thyroid treatments. She has been sleeping well on Oxygen 2 liters per nasal canula. Will investigate travel options for oxygen. She likes to go to [place], [place] and [place].   I would like for the patient to have a smaller ambulatory oxygen tank available but she can trouble with that she may be able to refill at different locations even if she doesn't  use it every night and travels. DME : Adult and pediatric home oxygen  She underwent a Knee replacement on March 6 th. Left side. Now has scar tissue that will be surgically removed and an outpatient procedure.  I spent more than 25 minutes of face to face time with the patient. Greater than 50% of time was spent in counseling and coordination of care. I like for her to Rv in 67 month    Larey Seat, MD  [facility] [address], [place],  [address] [phone]

## 2017-07-29 ENCOUNTER — Encounter (HOSPITAL_COMMUNITY): Payer: Self-pay | Admitting: Emergency Medicine

## 2017-07-29 ENCOUNTER — Emergency Department (HOSPITAL_COMMUNITY): Payer: Medicare Other

## 2017-07-29 ENCOUNTER — Emergency Department (HOSPITAL_COMMUNITY)
Admission: EM | Admit: 2017-07-29 | Discharge: 2017-07-29 | Disposition: A | Discharge status: Home / Self Care | Payer: Medicare Other | Attending: Emergency Medicine | Admitting: Emergency Medicine

## 2017-07-29 DIAGNOSIS — E039 Hypothyroidism, unspecified: Secondary | ICD-10-CM | POA: Diagnosis not present

## 2017-07-29 DIAGNOSIS — Z79899 Other long term (current) drug therapy: Secondary | ICD-10-CM | POA: Diagnosis not present

## 2017-07-29 DIAGNOSIS — Z96652 Presence of left artificial knee joint: Secondary | ICD-10-CM | POA: Insufficient documentation

## 2017-07-29 DIAGNOSIS — L03116 Cellulitis of left lower limb: Secondary | ICD-10-CM | POA: Insufficient documentation

## 2017-07-29 DIAGNOSIS — R1032 Left lower quadrant pain: Secondary | ICD-10-CM | POA: Diagnosis present

## 2017-07-29 LAB — COMPREHENSIVE METABOLIC PANEL
ALT: 61 U/L — ABNORMAL HIGH (ref 14–54)
AST: 52 U/L — ABNORMAL HIGH (ref 15–41)
Albumin: 4.2 g/dL (ref 3.5–5.0)
Alkaline Phosphatase: 100 U/L (ref 38–126)
Anion gap: 6 (ref 5–15)
BUN: 18 mg/dL (ref 6–20)
CO2: 28 mmol/L (ref 22–32)
Calcium: 9.5 mg/dL (ref 8.9–10.3)
Chloride: 107 mmol/L (ref 101–111)
Creatinine, Ser: 0.64 mg/dL (ref 0.44–1.00)
GFR calc Af Amer: >60 mL/min (ref >60)
GFR calc non Af Amer: >60 mL/min (ref >60)
Glucose, Bld: 101 mg/dL — ABNORMAL HIGH (ref 65–99)
Potassium: 4.7 mmol/L (ref 3.5–5.1)
Sodium: 141 mmol/L (ref 135–145)
Total Bilirubin: 0.6 mg/dL (ref 0.3–1.2)
Total Protein: 7.1 g/dL (ref 6.5–8.1)

## 2017-07-29 LAB — URINALYSIS, ROUTINE W REFLEX MICROSCOPIC
Bilirubin Urine: NEGATIVE
Glucose, UA: NEGATIVE mg/dL
Hgb urine dipstick: NEGATIVE
Ketones, ur: NEGATIVE mg/dL
Leukocytes, UA: NEGATIVE
Nitrite: NEGATIVE
Protein, ur: NEGATIVE mg/dL
Specific Gravity, Urine: 1.012 (ref 1.005–1.030)
pH: 5 (ref 5.0–8.0)

## 2017-07-29 LAB — CBC
HCT: 39.9 % (ref 36.0–46.0)
Hemoglobin: 13.7 g/dL (ref 12.0–15.0)
MCH: 31.4 pg (ref 26.0–34.0)
MCHC: 34.3 g/dL (ref 30.0–36.0)
MCV: 91.5 fL (ref 78.0–100.0)
Platelets: 229 10*3/uL (ref 150–400)
RBC: 4.36 MIL/uL (ref 3.87–5.11)
RDW: 13.2 % (ref 11.5–15.5)
WBC: 7 10*3/uL (ref 4.0–10.5)

## 2017-07-29 LAB — LIPASE, BLOOD: Lipase: 30 U/L (ref 11–51)

## 2017-07-29 MED ORDER — DOXYCYCLINE HYCLATE 100 MG PO CAPS: 100.0000 mg | ORAL_CAPSULE | Freq: Two times a day (BID) | ORAL | 0 refills | Status: DC

## 2017-07-29 MED ORDER — IOPAMIDOL (ISOVUE-300) INJECTION 61%
INTRAVENOUS | Status: AC
  Filled 2017-07-29: qty 100

## 2017-07-29 MED ORDER — IOPAMIDOL (ISOVUE-300) INJECTION 61%
100.0000 mL | Freq: Once | INTRAVENOUS | Status: AC | PRN
  Administered 2017-07-29: 100 mL via INTRAVENOUS

## 2017-07-29 NOTE — ED Triage Notes (Signed)
Patient states that she was treated for cellulitis and finished antibiotics last Friday.  Patient reports erythema, warmth and pain in left medial foot that got worse last night.  Patient adds that she had mid abd pain with nausea over the past couple days.

## 2017-07-29 NOTE — ED Provider Notes (Signed)
[facility] DEPT Provider Note   CSN: [id number] Arrival date & time: 07/29/17  0826     History   Chief Complaint Chief Complaint  Patient presents with  . Recurrent Skin Infections  . Abdominal Pain    HPI Brooke Williams is a 71 y.o. female.  HPI Patient's a 72 year old female presents the emergency department with complaints of recurrent cellulitis of the left foot. She reports that she was recently treated for a cellulitis of the left lower extremity which improved and resolved with Bactrim.  She states that she finished her course of antibiotics last Friday.  Over the past 24 hours she developed some pain and redness in the left foot.  She also reports left lower quadrant abdominal pain over the past week without nausea vomiting or diarrhea.  No blood in her stool.  Denies dysuria or urinary frequency.  Symptoms are mild in severity.   Past Medical History:  Diagnosis Date  . Arthritis    arthritis-hands. cervical disc degeneration  . Back pain    6th vertebrae has collapse on 7th,pinched nerve feeling occ  . Dizziness    rarely,takes Meclizine if needed  . Endometrial cancer ([place]) 2014  . GERD (gastroesophageal reflux disease)    controls with Zantac  . Headache(784.0)    headaches frequently but states since placed on 2L/m via N/C headaches have improved  . History of colon polyps    benign  . History of staph infection   . Hyperlipidemia    takes krill oil daily  . Hypothyroidism    takes Synthroid daily  . Ileus ([place]) 10/2015   was treated  . Joint pain   . Joint swelling   . Lupus    remission since 2008  . Nocturia   . OSA (obstructive sleep apnea)    sleep study in epic from 11-01-15.Uses Oxygen  . Pneumonia    hx of-within the last 15 yrs  . PONV (postoperative nausea and vomiting)   . Restless leg syndrome    takes Mirapex nightly  . Seasonal allergies    takes Allegra daily and uses Astelin Nasal Spray as needed    Patient Active Problem  List   Diagnosis Date Noted  . Primary osteoarthritis of both knees 09/24/2016  . Primary osteoarthritis of both hands 09/24/2016  . DJD (degenerative joint disease), cervical 09/24/2016  . Osteopenia of multiple sites 09/24/2016  . Vitamin D deficiency 09/24/2016  . Positive ANA (antinuclear antibody) 09/24/2016  . Recurrent aphthous stomatitis 02/04/2016  . Benign neoplasm of skin of trunk 02/04/2016  . 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 Chronic respiratory failure with hypoxia (HCC)/ noct hypoxemia 11/21/2015  . OSA (obstructive sleep apnea) 10/16/2015  . Chronic tension-type headache, not intractable 10/16/2015  . Atypical chest pain 08/28/2015  . Vasomotor flushing 07/05/2014  . Adult hypothyroidism 02/05/2014  . Hyperlipidemia 10/10/2013  . Endometrial ca ([place]) 10/10/2013  . Family history of premature CAD 10/10/2013  . Endometrial adenocarcinoma ([place]) 12/01/2012  . HEMORRHOIDS, INTERNAL 09/11/2010  . Migraine headache 03/19/2010  . ESOPHAGEAL REFLUX 03/19/2010  . FULL INCONTINENCE OF FECES 03/19/2010    Past Surgical History:  Procedure Laterality Date  . ABDOMINAL HYSTERECTOMY    . COLONOSCOPY    . DILATION AND CURETTAGE OF UTERUS     2008-polyp removed  . HAND SURGERY  2006   left thumb surgery with pin  . lipomas removed from back   40 yrs ago  . MASS EXCISION N/A 10/05/2014  Procedure: EXCISION OF BILATERAL LOWER BACK MASSES;  Surgeon: Coralie Keens, MD;  Location: [facility];  Service: General;  Laterality: N/A;  . MEDIAL PARTIAL KNEE REPLACEMENT  2005   LT knee  . OTHER SURGICAL HISTORY     metal pin/screw to repair arthritis in LT thumb joint  . ROBOTIC ASSISTED TOTAL HYSTERECTOMY WITH BILATERAL SALPINGO OOPHERECTOMY  12/20/2012   Procedure: ROBOTIC ASSISTED TOTAL HYSTERECTOMY WITH BILATERAL SALPINGO OOPHORECTOMY;  Surgeon: Imagene Gurney A. Alycia Rossetti, MD;  Location: [facility];  Service: Gynecology;  Laterality: N/A;  WITH PELVIC  PARAAORTIC   . ROBOTIC PELVIC AND PARA-AORTIC LYMPH NODE DISSECTION  12/20/2012   Procedure: ROBOTIC PELVIC AND PARA-AORTIC LYMPH NODE DISSECTION;  Surgeon: Imagene Gurney A. Alycia Rossetti, MD;  Location: [facility];  Service: Gynecology;  Laterality: Bilateral;  . TOTAL KNEE REVISION Left 02/03/2016   Procedure: TOTAL KNEE REVISION;  Surgeon: Frederik Pear, MD;  Location: [facility];  Service: Orthopedics;  Laterality: Left;    OB History    Gravida Para Term Preterm AB Living   0             SAB TAB Ectopic Multiple Live Births                   Home Medications    Prior to Admission medications   Medication Sig Start Date End Date Taking? Authorizing Provider  acetaminophen (TYLENOL) 500 MG tablet Take 1,000 mg by mouth every 6 (six) hours as needed for mild pain.   Yes [provider]  ASTRAGALUS PO Take 800 mg by mouth daily.   Yes [provider]  BLACK COHOSH PO Take 800 mg by mouth daily.   Yes [provider]  fexofenadine-pseudoephedrine (ALLEGRA-D) 60-120 MG 12 hr tablet Take 1 tablet by mouth 2 (two) times daily.   Yes [provider]  fluocinonide gel (LIDEX) 0.05 % APPLY TO AFFECTED AREA daily as needed 08/21/15  Yes [provider]  hyoscyamine (ANASPAZ) 0.125 MG TBDP disintergrating tablet Place 0.125 mg under the tongue 2 (two) times daily as needed for cramping.   Yes [provider]  Astrid Drafts Omega-3 500 MG CAPS Take 2 capsules by mouth daily.   Yes [provider]  Multiple Vitamin (MULTIVITAMIN WITH MINERALS) TABS Take 1 tablet by mouth daily.   Yes [provider]  Psyllium (METAMUCIL FIBER PO) Take 1 capsule by mouth daily.    Yes [provider]  ranitidine (ZANTAC) 150 MG tablet Take 150 mg by mouth daily as needed for heartburn.    Yes [provider]  SYNTHROID 75 MCG tablet Take 75 mcg by mouth daily before breakfast.  10/11/15  Yes [provider]  triamcinolone cream (KENALOG) 0.1 % Apply  1 application topically daily as needed (rash).  10/30/13  Yes [provider]  doxycycline (VIBRAMYCIN) 100 MG capsule Take 1 capsule (100 mg total) by mouth 2 (two) times daily. 07/29/17   Jola Schmidt, MD    Family History Family History  Problem Relation Age of Onset  . Stroke Mother 36       arterial wall of artery in eye   . Endometrial cancer Mother 21  . Dementia Father   . Stroke Father        "lots of many strokes"  . Aneurysm Father 19       AAA  . Multiple births Maternal Grandmother        died 5 days after delivery of unknown causes  .  Colon cancer Maternal Aunt        late 15s  . Cancer Cousin        maternal cousin with an unknown form of cancer    Social History Social History  Substance Use Topics  . Smoking status: Never Smoker  . Smokeless tobacco: Never Used  . Alcohol use Yes     Comment: occassional     Allergies   Cefdinir; Penicillins; Codeine; Penicillamine; and Tape   Review of Systems Review of Systems  All other systems reviewed and are negative.    Physical Exam Updated Vital Signs BP 130/69   Pulse 65   Temp 98.2 F (36.8 C) (Oral)   Resp 18   Ht 5\' 7"  (1.702 m)   Wt 76.2 kg (168 lb)   SpO2 99%   BMI 26.31 kg/m   Physical Exam  Constitutional: She is oriented to person, place, and time. She appears well-developed and well-nourished.  HENT:  Head: Normocephalic.  Eyes: EOM are normal.  Neck: Normal range of motion.  Cardiovascular: Normal rate.   Pulmonary/Chest: Effort normal.  Abdominal: She exhibits no distension.  Mild left lower quadrant abdominal tenderness without guarding or rebound  Musculoskeletal: Normal range of motion.  Mild erythema and warmth approximately silver dollar size on the left medial foot without drainage or fluctuance.  Normal pulses in left foot.  Full range of motion of left hip and left knee.  No left inguinal adenopathy noted  Neurological: She is alert and oriented to person,  place, and time.  Psychiatric: She has a normal mood and affect.  Nursing note and vitals reviewed.    ED Treatments / Results  Labs (all labs ordered are listed, but only abnormal results are displayed) Labs Reviewed  COMPREHENSIVE METABOLIC PANEL - Abnormal; Notable for the following:       Result Value   Glucose, Bld 101 (*)    AST 52 (*)    ALT 61 (*)    All other components within normal limits  URINALYSIS, ROUTINE W REFLEX MICROSCOPIC - Abnormal; Notable for the following:    Color, Urine STRAW (*)    All other components within normal limits  LIPASE, BLOOD  CBC    EKG  EKG Interpretation None       Radiology Ct Abdomen Pelvis W Contrast  Result Date: 07/29/2017 CLINICAL DATA:  Left lower quadrant abdominal pain and nausea. EXAM: CT ABDOMEN AND PELVIS WITH CONTRAST TECHNIQUE: Multidetector CT imaging of the abdomen and pelvis was performed using the standard protocol following bolus administration of intravenous contrast. CONTRAST:  154mL ISOVUE-300 IOPAMIDOL (ISOVUE-300) INJECTION 61% COMPARISON:  CT abdomen and pelvis dated November 03, 2015. FINDINGS: Lower chest: Stable 11 mm nodule in the right lower lobe, likely a hamartoma. No acute abnormality. Hepatobiliary: Unchanged calcification in the hepatic dome. No new focal liver abnormality. The gallbladder is unremarkable. No biliary dilatation. Pancreas: Unremarkable. No pancreatic ductal dilatation or surrounding inflammatory changes. Spleen: Normal in size without focal abnormality. Adrenals/Urinary Tract: The adrenal glands are unremarkable. Stable left renal cyst. No renal calculi or hydronephrosis. The bladder is unremarkable. Stomach/Bowel: Unchanged fatty infiltration of the rectal wall, likely due to chronic inflammation. No evidence of active inflammation. Unchanged inversion of the right colon, with the hepatic flexure in the right lower quadrant, and cecum in the right upper quadrant. No significant bowel wall  thickening, distention, or surrounding inflammatory change. Normal appendix. Vascular/Lymphatic: No significant vascular findings are present. Minimal atherosclerotic vascular calcifications. No enlarged  abdominal or pelvic lymph nodes. Reproductive: Status post hysterectomy. No adnexal masses. Other: No free fluid. Musculoskeletal: No acute or significant osseous findings. IMPRESSION: 1. No acute intra-abdominal process. No explanation for the patient's symptoms. 2. Unchanged fatty infiltration of the rectal wall, likely due to chronic inflammation. Electronically Signed   By: Titus Dubin M.D.   On: 07/29/2017 12:39    Procedures Procedures (including critical care time)  Medications Ordered in ED Medications  iopamidol (ISOVUE-300) 61 % injection (not administered)  iopamidol (ISOVUE-300) 61 % injection 100 mL (100 mLs Intravenous Contrast Given 07/29/17 1211)     Initial Impression / Assessment and Plan / ED Course  I have reviewed the triage vital signs and the nursing notes.  Pertinent labs & imaging results that were available during my care of the patient were reviewed by me and considered in my medical decision making (see chart for details).     Patient is overall well-appearing.  She has mild cellulitis of the left medial malleolus and left foot.  She has normal pulses in her left foot.  There is normal range of motion of the major joints of her left lower extremity.  Left lower quadrant pain shows no clear etiology for the cause of pain.  No adenopathy noted.  Discharge home on doxycycline. ) primary care follow-up.  Patient understands return to the ER for new or worsening symptoms  Final Clinical Impressions(s) / ED Diagnoses   Final diagnoses:  Cellulitis of left foot    New Prescriptions New Prescriptions   DOXYCYCLINE (VIBRAMYCIN) 100 MG CAPSULE    Take 1 capsule (100 mg total) by mouth 2 (two) times daily.     Jola Schmidt, MD 07/29/17 [phone]

## 2017-09-14 ENCOUNTER — Ambulatory Visit
Admission: RE | Admit: 2017-09-14 | Discharge: 2017-09-14 | Disposition: A | Discharge status: Home / Self Care | Payer: Medicare Other | Source: Ambulatory Visit | Attending: Family Medicine | Admitting: Family Medicine

## 2017-09-14 ENCOUNTER — Other Ambulatory Visit: Payer: Self-pay | Admitting: Family Medicine

## 2017-09-14 DIAGNOSIS — R609 Edema, unspecified: Secondary | ICD-10-CM

## 2017-09-15 NOTE — Progress Notes (Signed)
Office Visit Note  Patient: Brooke Williams             Date of Birth: Jun 19, 1946           MRN: [id number]             PCP: Stephens Shire, MD Referring: Stephens Shire, MD Visit Date: 09/28/2017 Occupation: @GUAROCC @    Subjective:  Arthritis (Back, bil hands, Left foot)   History of Present Illness: Brooke Williams is a 71 y.o. female with history of osteoarthritis and disc disease. According to patient she had left total knee replacement in March 2017 by Dr. Mayer Camel. She did fine after physical therapy. In August 2018 she started having increased pain in her left knee which was replaced. She was seen by Dr. Mayer Camel who evaluated and set that she has lot of scar tissue and needs revision. In the meantime she developed cellulitis in her left foot which took 2 months to heal. She was seen by PCP and had antibiotics for that. She's been having increased discomfort in her neck, bilateral hands, left knee, bilateral feet. She had recent autoimmune labs done by her PCP which were negative.  Activities of Daily Living:  Patient reports morning stiffness for 2 minutes.   Patient Denies nocturnal pain.  Difficulty dressing/grooming: Denies Difficulty climbing stairs: Reports Difficulty getting out of chair: Reports Difficulty using hands for taps, buttons, cutlery, and/or writing: Reports   Review of Systems  Constitutional: Positive for fatigue. Negative for night sweats, weight gain, weight loss and weakness.  HENT: Positive for mouth dryness. Negative for mouth sores, trouble swallowing, trouble swallowing and nose dryness.   Eyes: Positive for dryness. Negative for pain, redness and visual disturbance.  Respiratory: Negative for cough, shortness of breath and difficulty breathing.   Cardiovascular: Negative for chest pain, palpitations, hypertension, irregular heartbeat and swelling in legs/feet.  Gastrointestinal: Negative for blood in stool, constipation and diarrhea.  Endocrine:  Negative for increased urination.  Genitourinary: Negative for vaginal dryness.  Musculoskeletal: Positive for arthralgias, joint pain and morning stiffness. Negative for joint swelling, myalgias, muscle weakness, muscle tenderness and myalgias.  Skin: Positive for rash. Negative for color change, hair loss, skin tightness, ulcers and sensitivity to sunlight.       Rosacea  Allergic/Immunologic: Negative for susceptible to infections.  Neurological: Negative for dizziness, memory loss and night sweats.  Hematological: Negative for swollen glands.  Psychiatric/Behavioral: Positive for sleep disturbance. Negative for depressed mood. The patient is not nervous/anxious.        Uses oxygen at night    PMFS History:  Patient Active Problem List   Diagnosis Date Noted  . Primary osteoarthritis of both knees 09/24/2016  . Primary osteoarthritis of both hands 09/24/2016  . DJD (degenerative joint disease), cervical 09/24/2016  . Osteopenia of multiple sites 09/24/2016  . Vitamin D deficiency 09/24/2016  . Positive ANA (antinuclear antibody) 09/24/2016  . Recurrent aphthous stomatitis 02/04/2016  . Benign neoplasm of skin of trunk 02/04/2016  . 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 Chronic respiratory failure with hypoxia (HCC)/ noct hypoxemia 11/21/2015  . OSA (obstructive sleep apnea) 10/16/2015  . Chronic tension-type headache, not intractable 10/16/2015  . Atypical chest pain 08/28/2015  . Vasomotor flushing 07/05/2014  . Adult hypothyroidism 02/05/2014  . Hyperlipidemia 10/10/2013  . Endometrial ca ([place]) 10/10/2013  . Family history of premature CAD 10/10/2013  . Endometrial adenocarcinoma ([place]) 12/01/2012  . HEMORRHOIDS, INTERNAL 09/11/2010  . Migraine headache 03/19/2010  .  ESOPHAGEAL REFLUX 03/19/2010  . FULL INCONTINENCE OF FECES 03/19/2010    Past Medical History:  Diagnosis Date  . Arthritis    arthritis-hands. cervical disc degeneration  . Back pain     6th vertebrae has collapse on 7th,pinched nerve feeling occ  . Dizziness    rarely,takes Meclizine if needed  . Endometrial cancer ([place]) 2014  . GERD (gastroesophageal reflux disease)    controls with Zantac  . Headache(784.0)    headaches frequently but states since placed on 2L/m via N/C headaches have improved  . History of colon polyps    benign  . History of staph infection   . Hyperlipidemia    takes krill oil daily  . Hypothyroidism    takes Synthroid daily  . Ileus ([place]) 10/2015   was treated  . Joint pain   . Joint swelling   . Lupus    remission since 2008  . Nocturia   . OSA (obstructive sleep apnea)    sleep study in epic from 11-01-15.Uses Oxygen  . Pneumonia    hx of-within the last 15 yrs  . PONV (postoperative nausea and vomiting)   . Restless leg syndrome    takes Mirapex nightly  . Seasonal allergies    takes Allegra daily and uses Astelin Nasal Spray as needed    Family History  Problem Relation Age of Onset  . Stroke Mother 60       arterial wall of artery in eye   . Endometrial cancer Mother 63  . Dementia Father   . Stroke Father        "lots of many strokes"  . Aneurysm Father 72       AAA  . Multiple births Maternal Grandmother        died 5 days after delivery of unknown causes  . Colon cancer Maternal Aunt        late 67s  . Cancer Cousin        maternal cousin with an unknown form of cancer   Past Surgical History:  Procedure Laterality Date  . ABDOMINAL HYSTERECTOMY    . COLONOSCOPY    . DILATION AND CURETTAGE OF UTERUS     2008-polyp removed  . HAND SURGERY  2006   left thumb surgery with pin  . KNEE ARTHROPLASTY    . lipomas removed from back   40 yrs ago  . MASS EXCISION N/A 10/05/2014   Procedure: EXCISION OF BILATERAL LOWER BACK MASSES;  Surgeon: Coralie Keens, MD;  Location: [facility];  Service: General;  Laterality: N/A;  . MEDIAL PARTIAL KNEE REPLACEMENT  2005   LT knee  . OTHER SURGICAL HISTORY      metal pin/screw to repair arthritis in LT thumb joint  . ROBOTIC ASSISTED TOTAL HYSTERECTOMY WITH BILATERAL SALPINGO OOPHERECTOMY  12/20/2012   Procedure: ROBOTIC ASSISTED TOTAL HYSTERECTOMY WITH BILATERAL SALPINGO OOPHORECTOMY;  Surgeon: Imagene Gurney A. Alycia Rossetti, MD;  [facility];  Service: Gynecology;  Laterality: N/A;  WITH PELVIC PARAAORTIC   . ROBOTIC PELVIC AND PARA-AORTIC LYMPH NODE DISSECTION  12/20/2012   Procedure: ROBOTIC PELVIC AND PARA-AORTIC LYMPH NODE DISSECTION;  Surgeon: Imagene Gurney A. Alycia Rossetti, MD;  [facility];  Service: Gynecology;  Laterality: Bilateral;  . TOTAL KNEE REVISION Left 02/03/2016   Procedure: TOTAL KNEE REVISION;  Surgeon: Frederik Pear, MD;  Location: [facility];  Service: Orthopedics;  Laterality: Left;   Social History   Social History Narrative   Patient does not have any biological children  but has step children     Objective: Vital Signs: BP 123/72 (BP Location: Left Arm, Patient Position: Sitting, Cuff Size: Normal)   Pulse 84   Resp 16   Ht 5' 7"  (1.702 m)   Wt 171 lb (77.6 kg)   BMI 26.78 kg/m    Physical Exam  Constitutional: She is oriented to person, place, and time. She appears well-developed and well-nourished.  HENT:  Head: Normocephalic and atraumatic.  Eyes: Conjunctivae and EOM are normal.  Neck: Normal range of motion.  Cardiovascular: Normal rate, regular rhythm, normal heart sounds and intact distal pulses.   Pulmonary/Chest: Effort normal and breath sounds normal.  Abdominal: Soft. Bowel sounds are normal.  Lymphadenopathy:    She has no cervical adenopathy.  Neurological: She is alert and oriented to person, place, and time.  Skin: Skin is warm and dry. Capillary refill takes less than 2 seconds.  Facial erythema due to rosacea  Psychiatric: She has a normal mood and affect. Her behavior is normal.  Nursing note and vitals reviewed.    Musculoskeletal Exam: C-spine some discomfort range of motion. Shoulder joints elbow joints wrist  joints are good range of motion. She had bilateral DIP PIP thickening in her hands consistent with osteoarthritis. Mucinous cyst was noted on her left second finger DIP joint. Hip joints are good range of motion. Her left total knee replacement causes discomfort with range of motion. Ankle joints MTPs PIPs had no synovitis on examination.  CDAI Exam: No CDAI exam completed.    Investigation: Findings:  09/14/2017 dsDNA negative, C3-C4 normal, CRP normal, ESR 17 mildly elevated, CMP normal, CBC normal 09/14/2017 x-ray of left foot 2 views soft tissue swelling in the medial, volar aspect of the forefoot spurring in the dorsal midfoot inferior calcaneal spur with nearby plantar fascia calcification no erosive changes upon distraction no appreciable joint space narrowing no fracture or dislocation. Signed by Dr. Lowella Grip    Imaging: Dg Foot 2 Views Left  Result Date: 09/14/2017 CLINICAL DATA:  Pain and swelling.  Recent cellulitis EXAM: LEFT FOOT - 2 VIEW COMPARISON:  None. FINDINGS: Frontal and lateral views were obtained. There is soft tissue swelling along the medial, volar aspect of the forefoot. There is no evident fracture or dislocation. There is spurring in the dorsal midfoot region. There is an inferior calcaneal spur. There is mild calcification in the plantar fascia posteriorly. There is no appreciable joint space narrowing or erosion. No bony destruction evident. IMPRESSION: Soft tissue swelling in the medial, volar aspect of the forefoot. Spurring in dorsal midfoot. Inferior calcaneal spur with nearby plantar fascia calcification. No erosive change or bony destruction. No appreciable joint space narrowing. No fracture or dislocation. Electronically Signed   By: Lowella Grip III M.D.   On: 09/14/2017 14:15    Speciality Comments: No specialty comments available.    Procedures:  No procedures performed Allergies: Cefdinir; Penicillins; Codeine; Penicillamine; and Tape    Assessment / Plan:     Visit Diagnoses: Primary osteoarthritis of both hands she has DIP PIP thickening with ongoing pain and discomfort. She also has a mucinous cyst on her left second finger DIP joint. I've advised her to up cervical for right now. If it becomes more prominent and causes more discomfort she may need referral to hand surgery. She does not take Mobic on regular basis. Given her prescription for Voltaren gel which continues topically. Side effects were discussed at length. I also discussed the option of using Bionicare hand  brace in future. She will review it and let me know.  Primary osteoarthritis of right knee: She has minimal discomfort.  Status post total left knee replacement - March 2017 by Dr. Mayer Camel. She's been having increase discomfort in her left total knee which is been replaced. She many ReVision in future. The ReVision was postponed due to cellulitis on her left foot.  Cellulitis on left foot has resolved now after taking antibiotics.  Osteoarthritis of cervical spine, unspecified spinal osteoarthritis complication status: She's been having increased pain and discomfort in her C-spine. Exercises were discussed. She states she's going through physical therapy right now.  Osteopenia of multiple sites  History of vitamin D deficiency: She's been on Vitamin D supplement.  Positive ANA (antinuclear antibody) -  history of, negative later. She has had autoimmune labs twice in the last 1 year. Although autoimmune labs were negative.  Other medical problems are listed as follows:  History of sleep apnea  History of hyperlipidemia  History of hypothyroidism  History of endometrial cancer  History of migraine    Orders: No orders of the defined types were placed in this encounter.  Meds ordered this encounter  Medications  . diclofenac sodium (VOLTAREN) 1 % GEL    Sig: Apply 3 gm to 3 large joints up to 3 times a day.Dispense 3 tubes with 3 refills.     Dispense:  3 Tube    Refill:  1    Face-to-face time spent with patient was 30 minutes. Greater than 50% of time was spent in counseling and coordination of care.  Follow-Up Instructions: Return in about 1 year (around 09/28/2018) for Osteoarthritis, DDD.   Bo Merino, MD  Note - This record has been created using Editor, commissioning.  Chart creation errors have been sought, but may not always  have been located. Such creation errors do not reflect on  the standard of medical care.

## 2017-09-28 ENCOUNTER — Ambulatory Visit (INDEPENDENT_AMBULATORY_CARE_PROVIDER_SITE_OTHER): Payer: Medicare Other | Admitting: Rheumatology

## 2017-09-28 ENCOUNTER — Encounter: Payer: Self-pay | Admitting: Rheumatology

## 2017-09-28 VITALS — BP 123/72 | HR 84 | Resp 16 | Ht 67.0 in | Wt 171.0 lb

## 2017-09-28 DIAGNOSIS — M47812 Spondylosis without myelopathy or radiculopathy, cervical region: Secondary | ICD-10-CM | POA: Diagnosis not present

## 2017-09-28 DIAGNOSIS — Z8669 Personal history of other diseases of the nervous system and sense organs: Secondary | ICD-10-CM | POA: Diagnosis not present

## 2017-09-28 DIAGNOSIS — Z8639 Personal history of other endocrine, nutritional and metabolic disease: Secondary | ICD-10-CM | POA: Diagnosis not present

## 2017-09-28 DIAGNOSIS — R768 Other specified abnormal immunological findings in serum: Secondary | ICD-10-CM

## 2017-09-28 DIAGNOSIS — M8589 Other specified disorders of bone density and structure, multiple sites: Secondary | ICD-10-CM

## 2017-09-28 DIAGNOSIS — R2232 Localized swelling, mass and lump, left upper limb: Secondary | ICD-10-CM | POA: Diagnosis not present

## 2017-09-28 DIAGNOSIS — M19041 Primary osteoarthritis, right hand: Secondary | ICD-10-CM

## 2017-09-28 DIAGNOSIS — M1711 Unilateral primary osteoarthritis, right knee: Secondary | ICD-10-CM

## 2017-09-28 DIAGNOSIS — Z96652 Presence of left artificial knee joint: Secondary | ICD-10-CM

## 2017-09-28 DIAGNOSIS — M19042 Primary osteoarthritis, left hand: Secondary | ICD-10-CM

## 2017-09-28 DIAGNOSIS — Z8542 Personal history of malignant neoplasm of other parts of uterus: Secondary | ICD-10-CM | POA: Diagnosis not present

## 2017-09-28 MED ORDER — DICLOFENAC SODIUM 1 % TD GEL: TRANSDERMAL | 1 refills | Status: DC

## 2017-09-28 NOTE — Patient Instructions (Signed)
Consider Bionicare hand brace in future.

## 2017-10-27 ENCOUNTER — Telehealth: Payer: Self-pay | Admitting: Gynecologic Oncology

## 2017-10-27 NOTE — Telephone Encounter (Signed)
TC to patient. Her CT here was unremarkable. Will schedule at [facility] when seh returns to see me in 3/19. PG

## 2018-03-29 ENCOUNTER — Other Ambulatory Visit: Payer: Self-pay | Admitting: Orthopedic Surgery

## 2018-03-31 ENCOUNTER — Other Ambulatory Visit: Payer: Self-pay

## 2018-03-31 ENCOUNTER — Encounter (HOSPITAL_COMMUNITY): Payer: Self-pay

## 2018-03-31 ENCOUNTER — Encounter (HOSPITAL_COMMUNITY)
Admission: RE | Admit: 2018-03-31 | Discharge: 2018-03-31 | Disposition: A | Discharge status: Home / Self Care | Payer: Medicare Other | Source: Ambulatory Visit | Attending: Orthopedic Surgery | Admitting: Orthopedic Surgery

## 2018-03-31 DIAGNOSIS — Z01810 Encounter for preprocedural cardiovascular examination: Secondary | ICD-10-CM | POA: Diagnosis present

## 2018-03-31 DIAGNOSIS — Z01812 Encounter for preprocedural laboratory examination: Secondary | ICD-10-CM | POA: Insufficient documentation

## 2018-03-31 DIAGNOSIS — Z01818 Encounter for other preprocedural examination: Secondary | ICD-10-CM

## 2018-03-31 LAB — BASIC METABOLIC PANEL
Anion gap: 7 (ref 5–15)
BUN: 24 mg/dL — ABNORMAL HIGH (ref 6–20)
CO2: 28 mmol/L (ref 22–32)
Calcium: 9.8 mg/dL (ref 8.9–10.3)
Chloride: 106 mmol/L (ref 101–111)
Creatinine, Ser: 0.74 mg/dL (ref 0.44–1.00)
GFR calc Af Amer: >60 mL/min (ref >60)
GFR calc non Af Amer: >60 mL/min (ref >60)
Glucose, Bld: 95 mg/dL (ref 65–99)
Potassium: 3.9 mmol/L (ref 3.5–5.1)
Sodium: 141 mmol/L (ref 135–145)

## 2018-03-31 LAB — CBC WITH DIFFERENTIAL/PLATELET
Basophils Absolute: 0 10*3/uL (ref 0.0–0.1)
Basophils Relative: 0 %
Eosinophils Absolute: 0.3 10*3/uL (ref 0.0–0.7)
Eosinophils Relative: 3 %
HCT: 43.2 % (ref 36.0–46.0)
Hemoglobin: 14.2 g/dL (ref 12.0–15.0)
Lymphocytes Relative: 23 %
Lymphs Abs: 1.8 10*3/uL (ref 0.7–4.0)
MCH: 30.5 pg (ref 26.0–34.0)
MCHC: 32.9 g/dL (ref 30.0–36.0)
MCV: 92.9 fL (ref 78.0–100.0)
Monocytes Absolute: 0.7 10*3/uL (ref 0.1–1.0)
Monocytes Relative: 9 %
Neutro Abs: 5 10*3/uL (ref 1.7–7.7)
Neutrophils Relative %: 65 %
Platelets: 257 10*3/uL (ref 150–400)
RBC: 4.65 MIL/uL (ref 3.87–5.11)
RDW: 14.2 % (ref 11.5–15.5)
WBC: 7.8 10*3/uL (ref 4.0–10.5)

## 2018-03-31 LAB — SURGICAL PCR SCREEN
MRSA, PCR: NEGATIVE
Staphylococcus aureus: NEGATIVE

## 2018-03-31 LAB — URINALYSIS, ROUTINE W REFLEX MICROSCOPIC
Bacteria, UA: NONE SEEN
Bilirubin Urine: NEGATIVE
Glucose, UA: NEGATIVE mg/dL
Hgb urine dipstick: NEGATIVE
Ketones, ur: NEGATIVE mg/dL
Leukocytes, UA: NEGATIVE
Nitrite: NEGATIVE
Protein, ur: NEGATIVE mg/dL
Specific Gravity, Urine: 1.006 (ref 1.005–1.030)
pH: 5 (ref 5.0–8.0)

## 2018-03-31 LAB — APTT: aPTT: 28 seconds (ref 24–36)

## 2018-03-31 LAB — PROTIME-INR
INR: 0.94
Prothrombin Time: 12.5 seconds (ref 11.4–15.2)

## 2018-03-31 NOTE — Pre-Procedure Instructions (Signed)
Brooke Williams  03/31/2018      [place], [place] [address] Phone: [phone] Fax: [phone]  [id number] - [place], [place] - [address] [place] [address] Phone: [phone] Fax: [phone]    Your procedure is scheduled on May 10  Report to [facility] at [email or web address].  Call this number if you have problems the morning of surgery:  [phone]   Remember:  Do not eat food or drink liquids after midnight.  Continue all medications as directed by your physician except follow these medication instructions before surgery below   Take these medicines the morning of surgery with A SIP OF WATER  doxycycline (VIBRAMYCIN)  ranitidine (ZANTAC) SYNTHROID  7 days prior to surgery STOP taking any Aspirin(unless otherwise instructed by your surgeon), Aleve, Naproxen, Ibuprofen, Motrin, Advil, Goody's, BC's, all herbal medications, fish oil, and all vitamins   Do not wear jewelry, make-up or nail polish.  Do not wear lotions, powders, or perfumes, or deodorant.  Do not shave 48 hours prior to surgery.    Do not bring valuables to the hospital.  [facility] is not responsible for any belongings or valuables.  Contacts, dentures or bridgework may not be worn into surgery.  Leave your suitcase in the car.  After surgery it may be brought to your room.  For patients admitted to the hospital, discharge time will be determined by your treatment team.  Patients discharged the day of surgery will not be allowed to drive home.    Special instructions:   [place]- Preparing For Surgery  Before surgery, you can play an important role. Because skin is not sterile, your skin needs to be as free of germs as possible. You can reduce the number of germs on your skin by washing with CHG (chlorahexidine gluconate)  Soap before surgery.  CHG is an antiseptic cleaner which kills germs and bonds with the skin to continue killing germs even after washing.  Please do not use if you have an allergy to CHG or antibacterial soaps. If your skin becomes reddened/irritated stop using the CHG.  Do not shave (including legs and underarms) for at least 48 hours prior to first CHG shower. It is OK to shave your face.  Please follow these instructions carefully.   1. Shower the NIGHT BEFORE SURGERY and the MORNING OF SURGERY with CHG.   2. If you chose to wash your hair, wash your hair first as usual with your normal shampoo.  3. After you shampoo, rinse your hair and body thoroughly to remove the shampoo.  4. Use CHG as you would any other liquid soap. You can apply CHG directly to the skin and wash gently with a scrungie or a clean washcloth.   5. Apply the CHG Soap to your body ONLY FROM THE NECK DOWN.  Do not use on open wounds or open sores. Avoid contact with your eyes, ears, mouth and genitals (private parts). Wash Face and genitals (private parts)  with your normal soap.  6. Wash thoroughly, paying special attention to the area where your surgery will be performed.  7. Thoroughly rinse your body with warm water from the neck down.  8. DO NOT shower/wash with your normal soap after using and rinsing off the CHG Soap.  9. Pat yourself dry with a  CLEAN TOWEL.  10. Wear CLEAN PAJAMAS to bed the night before surgery, wear comfortable clothes the morning of surgery  11. Place CLEAN SHEETS on your bed the night of your first shower and DO NOT SLEEP WITH PETS.    Day of Surgery: Do not apply any deodorants/lotions. Please wear clean clothes to the hospital/surgery center.      Please read over the following fact sheets that you were given.

## 2018-03-31 NOTE — Progress Notes (Signed)
PCP - Juanita Craver Cardiologist - Claiborne Billings hasnt seen in 2-3 years  Chest x-ray - 03/31/18 EKG - 03/31/18 Stress Test -2016  ECHO - 2016 Cardiac Cathdenies -   Sleep Study - 2016 CPAP - no  Anesthesia review: yes cardiac history  Patient denies shortness of breath, fever, cough and chest pain at PAT appointment   Patient verbalized understanding of instructions that were given to them at the PAT appointment. Patient was also instructed that they will need to review over the PAT instructions again at home before surgery.

## 2018-04-01 ENCOUNTER — Other Ambulatory Visit: Payer: Self-pay | Admitting: Orthopedic Surgery

## 2018-04-01 LAB — TYPE AND SCREEN
ABO/RH(D): B POS
Antibody Screen: NEGATIVE

## 2018-04-01 NOTE — Progress Notes (Signed)
Anesthesia Chart Review:   Case:  [id number] Date/Time:  04/08/18 0715   Procedure:  TOTAL KNEE ARTHROPLASTY AND LEFT KNEE ARTHROSCOPY (Bilateral ) - Right TKA and Left Knee Arthroscopy   Anesthesia type:  Spinal   Pre-op diagnosis:  RIGHT KNEE OSTEOARTHRITIS AND LEFT TOTAL KNEE FIBROUS BANDS   Location:  [facility] OR ROOM 06 / [facility]   Surgeon:  Frederik Pear, MD      DISCUSSION: Pt is a 72 year old female with hx OSA (uses 2L O2 at night), endometrial cancer.    VS: BP 140/83   Pulse 99   Temp 36.7 C   Resp 20   Ht 5' 6.5" (1.689 m)   Wt 164 lb 1.6 oz (74.4 kg)   SpO2 100%   BMI 26.09 kg/m    PROVIDERS: Stephens Shire, MD  Gyn-oncologist is Nancy Marus, MD Used to see cardiologist Shelva Majestic, MD for strong family hx CAD; last office visit 08/28/15   LABS: Labs reviewed: Acceptable for surgery. (all labs ordered are listed, but only abnormal results are displayed)  Labs Reviewed  BASIC METABOLIC PANEL - Abnormal; Notable for the following components:      Result Value   BUN 24 (*)    All other components within normal limits  URINALYSIS, ROUTINE W REFLEX MICROSCOPIC - Abnormal; Notable for the following components:   Color, Urine STRAW (*)    All other components within normal limits  SURGICAL PCR SCREEN  APTT  CBC WITH DIFFERENTIAL/PLATELET  PROTIME-INR  TYPE AND SCREEN     IMAGES:  CXR 03/31/18: No active cardiopulmonary disease   EKG 03/31/18: NSR. Possible Inferior infarct, age undetermined   CV:  Exercise tolerance test 10/01/15:  - ETT with fair exercise tolerance (7:00); no chest pain; normal BP response; no ST changes; negative adequate ETT.  Echo 08/13/11:  1.  Mild concentric LVH.  LV systolic function normal.  EF >55%.  Transmitral spectral Doppler flow pattern suggestive of impaired LV relaxation. 2.  LA size is normal. 3.  Mild mitral annular calcification. 4.  No significant valvular disease.   Past Medical History:  Diagnosis Date  . Arthritis     arthritis-hands. cervical disc degeneration  . Back pain    6th vertebrae has collapse on 7th,pinched nerve feeling occ  . Dizziness    rarely,takes Meclizine if needed  . Endometrial cancer ([place]) 2014  . GERD (gastroesophageal reflux disease)    controls with Zantac  . Headache(784.0)    headaches frequently but states since placed on 2L/m via N/C headaches have improved  . History of colon polyps    benign  . History of staph infection   . Hyperlipidemia    takes krill oil daily  . Hypothyroidism    takes Synthroid daily  . Ileus ([place]) 10/2015   was treated  . Joint pain   . Joint swelling   . Lupus ([place])    remission since 2008  . Nocturia   . OSA (obstructive sleep apnea)    sleep study in epic from 11-01-15.Uses Oxygen  . Pneumonia    hx of-within the last 15 yrs  . PONV (postoperative nausea and vomiting)   . Restless leg syndrome    takes Mirapex nightly  . Seasonal allergies    takes Allegra daily and uses Astelin Nasal Spray as needed    Past Surgical History:  Procedure Laterality Date  . ABDOMINAL HYSTERECTOMY    . BRAIN SURGERY  left cataract  . COLON SURGERY     12/2017  . COLONOSCOPY    . DILATION AND CURETTAGE OF UTERUS     2008-polyp removed  . HAND SURGERY  2006   left thumb surgery with pin  . KNEE ARTHROPLASTY    . lipomas removed from back   40 yrs ago  . MASS EXCISION N/A 10/05/2014   Procedure: EXCISION OF BILATERAL LOWER BACK MASSES;  Surgeon: Coralie Keens, MD;  Location: [facility];  Service: General;  Laterality: N/A;  . MEDIAL PARTIAL KNEE REPLACEMENT  2005   LT knee  . OTHER SURGICAL HISTORY     metal pin/screw to repair arthritis in LT thumb joint  . ROBOTIC ASSISTED TOTAL HYSTERECTOMY WITH BILATERAL SALPINGO OOPHERECTOMY  12/20/2012   Procedure: ROBOTIC ASSISTED TOTAL HYSTERECTOMY WITH BILATERAL SALPINGO OOPHORECTOMY;  Surgeon: Imagene Gurney A. Alycia Rossetti, MD;  Location: [facility];  Service: Gynecology;  Laterality: N/A;   WITH PELVIC PARAAORTIC   . ROBOTIC PELVIC AND PARA-AORTIC LYMPH NODE DISSECTION  12/20/2012   Procedure: ROBOTIC PELVIC AND PARA-AORTIC LYMPH NODE DISSECTION;  Surgeon: Imagene Gurney A. Alycia Rossetti, MD;  Location: [facility];  Service: Gynecology;  Laterality: Bilateral;  . TOTAL KNEE REVISION Left 02/03/2016   Procedure: TOTAL KNEE REVISION;  Surgeon: Frederik Pear, MD;  Location: [facility];  Service: Orthopedics;  Laterality: Left;    MEDICATIONS: . ASTRAGALUS PO  . BLACK COHOSH PO  . diclofenac sodium (VOLTAREN) 1 % GEL  . doxycycline (VIBRAMYCIN) 50 MG capsule  . fexofenadine-pseudoephedrine (ALLEGRA-D) 60-120 MG 12 hr tablet  . fluocinonide gel (LIDEX) 0.05 %  . hyoscyamine (ANASPAZ) 0.125 MG TBDP disintergrating tablet  . Multiple Vitamin (MULTIVITAMIN WITH MINERALS) TABS  . Polyethyl Glycol-Propyl Glycol (SYSTANE OP)  . Psyllium (METAMUCIL FIBER PO)  . ranitidine (ZANTAC) 150 MG tablet  . SYNTHROID 75 MCG tablet  . triamcinolone cream (KENALOG) 0.1 %   No current facility-administered medications for this encounter.     If no changes, I anticipate pt can proceed with surgery as scheduled.   Willeen Cass, FNP-BC [facility]/Anesthesiology Phone: [phone] 04/01/2018 10:05 AM

## 2018-04-01 NOTE — Care Plan (Signed)
Spoke with patient prior to surgery. She is known to me from prior surgery. She has all needed equipment at home. She will discharge to home with family and HHPT provided by [facility]. She is scheduled to follow up with Dr. Mayer Camel in the office on 04/19/18 @ 200 and will transition to [place] at 320 at [facility].   Please contact Ladell Heads, [facility] [phone] with questions or if this plan needs to change.   Thank you

## 2018-04-06 DIAGNOSIS — M1711 Unilateral primary osteoarthritis, right knee: Secondary | ICD-10-CM | POA: Diagnosis present

## 2018-04-06 NOTE — H&P (Signed)
TOTAL KNEE ADMISSION H&P  Patient is being admitted for right total knee arthroplasty.  Subjective:  Chief Complaint:right and left knee pain.  HPI: Brooke Williams, 72 y.o. female, has a history of pain and functional disability in the right knee due to arthritis and has failed non-surgical conservative treatments for greater than 12 weeks to includeNSAID's and/or analgesics, corticosteriod injections, viscosupplementation injections, flexibility and strengthening excercises, use of assistive devices, weight reduction as appropriate and activity modification.  Onset of symptoms was gradual, starting several years ago with gradually worsening course since that time. The patient noted no past surgery on the right knee(s).  Patient currently rates pain in the right knee(s) at 10 out of 10 with activity. Patient has night pain, worsening of pain with activity and weight bearing, pain that interferes with activities of daily living, pain with passive range of motion, crepitus and joint swelling.  Patient has evidence of periarticular osteophytes and joint space narrowing by imaging studies.   There is no active infection.   Unfortunately following the left total knee she did fall and soon afterwards she noticed scar tissue in the left knee that has caused popping and catching.  She states that he continues to clunk and grind pop and catch and at one point we are considering a knee arthroscopy to remove a fibrous band.   Patient Active Problem List   Diagnosis Date Noted  . Primary osteoarthritis of both knees 09/24/2016  . Primary osteoarthritis of both hands 09/24/2016  . DJD (degenerative joint disease), cervical 09/24/2016  . Osteopenia of multiple sites 09/24/2016  . Vitamin D deficiency 09/24/2016  . Positive ANA (antinuclear antibody) 09/24/2016  . Recurrent aphthous stomatitis 02/04/2016  . Benign neoplasm of skin of trunk 02/04/2016  . 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 Chronic respiratory failure with hypoxia (HCC)/ noct hypoxemia 11/21/2015  . OSA (obstructive sleep apnea) 10/16/2015  . Chronic tension-type headache, not intractable 10/16/2015  . Atypical chest pain 08/28/2015  . Vasomotor flushing 07/05/2014  . Adult hypothyroidism 02/05/2014  . Hyperlipidemia 10/10/2013  . Endometrial ca ([place]) 10/10/2013  . Family history of premature CAD 10/10/2013  . Endometrial adenocarcinoma ([place]) 12/01/2012  . HEMORRHOIDS, INTERNAL 09/11/2010  . Migraine headache 03/19/2010  . ESOPHAGEAL REFLUX 03/19/2010  . FULL INCONTINENCE OF FECES 03/19/2010   Past Medical History:  Diagnosis Date  . Arthritis    arthritis-hands. cervical disc degeneration  . Back pain    6th vertebrae has collapse on 7th,pinched nerve feeling occ  . Dizziness    rarely,takes Meclizine if needed  . Endometrial cancer ([place]) 2014  . GERD (gastroesophageal reflux disease)    controls with Zantac  . Headache(784.0)    headaches frequently but states since placed on 2L/m via N/C headaches have improved  . History of colon polyps    benign  . History of staph infection   . Hyperlipidemia    takes krill oil daily  . Hypothyroidism    takes Synthroid daily  . Ileus ([place]) 10/2015   was treated  . Joint pain   . Joint swelling   . Lupus ([place])    remission since 2008  . Nocturia   . OSA (obstructive sleep apnea)    sleep study in epic from 11-01-15.Uses Oxygen  . Pneumonia    hx of-within the last 15 yrs  . PONV (postoperative nausea and vomiting)   . Restless leg syndrome    takes Mirapex nightly  . Seasonal allergies  takes Allegra daily and uses Astelin Nasal Spray as needed    Past Surgical History:  Procedure Laterality Date  . ABDOMINAL HYSTERECTOMY    . BRAIN SURGERY     left cataract  . COLON SURGERY     12/2017  . COLONOSCOPY    . DILATION AND CURETTAGE OF UTERUS     2008-polyp removed  . HAND SURGERY  2006   left thumb surgery with pin  . KNEE  ARTHROPLASTY    . lipomas removed from back   40 yrs ago  . MASS EXCISION N/A 10/05/2014   Procedure: EXCISION OF BILATERAL LOWER BACK MASSES;  Surgeon: Coralie Keens, MD;  Location: [facility];  Service: General;  Laterality: N/A;  . MEDIAL PARTIAL KNEE REPLACEMENT  2005   LT knee  . OTHER SURGICAL HISTORY     metal pin/screw to repair arthritis in LT thumb joint  . ROBOTIC ASSISTED TOTAL HYSTERECTOMY WITH BILATERAL SALPINGO OOPHERECTOMY  12/20/2012   Procedure: ROBOTIC ASSISTED TOTAL HYSTERECTOMY WITH BILATERAL SALPINGO OOPHORECTOMY;  Surgeon: Imagene Gurney A. Alycia Rossetti, MD;  [facility];  Service: Gynecology;  Laterality: N/A;  WITH PELVIC PARAAORTIC   . ROBOTIC PELVIC AND PARA-AORTIC LYMPH NODE DISSECTION  12/20/2012   Procedure: ROBOTIC PELVIC AND PARA-AORTIC LYMPH NODE DISSECTION;  Surgeon: Imagene Gurney A. Alycia Rossetti, MD;  [facility];  Service: Gynecology;  Laterality: Bilateral;  . TOTAL KNEE REVISION Left 02/03/2016   Procedure: TOTAL KNEE REVISION;  Surgeon: Frederik Pear, MD;  Location: [facility];  Service: Orthopedics;  Laterality: Left;    No current facility-administered medications for this encounter.    Current Outpatient Medications  Medication Sig Dispense Refill Last Dose  . ASTRAGALUS PO Take 1 capsule by mouth 2 (two) times daily.    Taking  . BLACK COHOSH PO Take 1 capsule by mouth daily.    Taking  . doxycycline (VIBRAMYCIN) 50 MG capsule Take 50 mg by mouth daily.  3 Taking  . fexofenadine-pseudoephedrine (ALLEGRA-D) 60-120 MG 12 hr tablet Take 1 tablet by mouth 2 (two) times daily as needed (allergies or cold symptoms).    Taking  . fluocinonide gel (LIDEX) 0.05 % APPLY TO AFFECTED AREA twice a day as needed for canker sores.  1 Taking  . hyoscyamine (ANASPAZ) 0.125 MG TBDP disintergrating tablet Place 0.125 mg under the tongue 2 (two) times daily as needed for cramping.   Taking  . Multiple Vitamin (MULTIVITAMIN WITH MINERALS) TABS Take 1 tablet by mouth daily.   Taking   . Polyethyl Glycol-Propyl Glycol (SYSTANE OP) Apply 1 drop to eye every 8 (eight) hours as needed (dry eyes).     . Psyllium (METAMUCIL FIBER PO) Take 5 capsules by mouth daily.    Taking  . ranitidine (ZANTAC) 150 MG tablet Take 150 mg by mouth daily.    Taking  . SYNTHROID 75 MCG tablet Take 75 mcg by mouth daily before breakfast.    Taking  . triamcinolone cream (KENALOG) 0.1 % Apply 1 application topically 2 (two) times daily as needed (rash).    Taking  . diclofenac sodium (VOLTAREN) 1 % GEL Apply 3 gm to 3 large joints up to 3 times a day.Dispense 3 tubes with 3 refills. (Patient not taking: Reported on 03/25/2018) 3 Tube 1 Not Taking at Unknown time   Allergies  Allergen Reactions  . Cefdinir Hives, Itching and Rash  . Penicillins Hives    Has patient had a PCN reaction causing immediate rash, facial/tongue/throat swelling, SOB or lightheadedness  with hypotension: No pt states she mostly had hives  Has patient had a PCN reaction occurring within the last 10 years: Yes  If all of the above answers are "NO", then may proceed with Cephalosporin use.     . Tape Itching and Rash    Skin turns red Bandaid    Social History   Tobacco Use  . Smoking status: Never Smoker  . Smokeless tobacco: Never Used  Substance Use Topics  . Alcohol use: Yes    Comment: occassional    Family History  Problem Relation Age of Onset  . Stroke Mother 34       arterial wall of artery in eye   . Endometrial cancer Mother 61  . Dementia Father   . Stroke Father        "lots of many strokes"  . Aneurysm Father 62       AAA  . Multiple births Maternal Grandmother        died 5 days after delivery of unknown causes  . Colon cancer Maternal Aunt        late 31s  . Cancer Cousin        maternal cousin with an unknown form of cancer     Review of Systems  Constitutional: Positive for diaphoresis.  HENT: Positive for sinus pain and tinnitus.   Eyes: Negative.   Respiratory: Negative.    Cardiovascular: Negative.   Gastrointestinal: Negative.   Genitourinary: Negative.   Musculoskeletal: Positive for joint pain.  Skin: Negative.   Neurological: Positive for headaches.  Endo/Heme/Allergies: Negative.   Psychiatric/Behavioral: Negative.     Objective:  Physical Exam  Constitutional: She is oriented to person, place, and time. She appears well-developed and well-nourished.  HENT:  Head: Normocephalic and atraumatic.  Eyes: Pupils are equal, round, and reactive to light.  Neck: Normal range of motion. Neck supple.  Cardiovascular: Intact distal pulses.  Respiratory: Effort normal.  Musculoskeletal: She exhibits tenderness.  the patient's left knee has good strength and good range of motion but does have obvious crunching and catching.  She has minimal pain with palpation.  No noticeable effusion.  Patient's right knee also has good range of motion but she does have pain with palpation over the medial joint line.  No noticeable effusion.  No erythema or warmth.  Her calves are soft and nontender.  No instability.  Neurological: She is alert and oriented to person, place, and time.  Skin: Skin is warm and dry.  Psychiatric: She has a normal mood and affect. Her behavior is normal. Judgment and thought content normal.    Vital signs in last 24 hours:    Labs:   Estimated body mass index is 26.09 kg/m as calculated from the following:   Height as of 03/31/18: 5' 6.5" (1.689 m).   Weight as of 03/31/18: 74.4 kg (164 lb 1.6 oz).   Imaging Review Plain radiographs demonstrate bilateral AP weightbearing, bilateral Rosenberg, lateral sunrise views of the right knee are taken and reviewed in office today.  This shows a well-placed well fixed left total knee arthroplasty.  Patient's right knee does have bone-on-bone arthritis of the medial compartment.  She also has moderate patellofemoral arthritis with periarticular osteophyte formation.   Preoperative templating of the  joint replacement has been completed, documented, and submitted to the Operating Room personnel in order to optimize intra-operative equipment management.   Anticipated LOS equal to or greater than 2 midnights due to - Age 66 and older  with one or more of the following:  - Obesity  - Expected need for hospital services (PT, OT, Nursing) required for safe  discharge   Assessment/Plan:  End stage arthritis, right knee and  Fibrous band left total knee  The patient history, physical examination, clinical judgment of the provider and imaging studies are consistent with end stage degenerative joint disease of the right knee(s) and total knee arthroplasty is deemed medically necessary. The treatment options including medical management, injection therapy arthroscopy and arthroplasty were discussed at length. The risks and benefits of total knee arthroplasty were presented and reviewed. The risks due to aseptic loosening, infection, stiffness, patella tracking problems, thromboembolic complications and other imponderables were discussed. The patient acknowledged the explanation, agreed to proceed with the plan and consent was signed. Patient is being admitted for inpatient treatment for surgery, pain control, PT, OT, prophylactic antibiotics, VTE prophylaxis, progressive ambulation and ADL's and discharge planning. The patient is planning to be discharged home with home health services.

## 2018-04-07 ENCOUNTER — Encounter: Payer: Self-pay | Admitting: Cardiovascular Disease

## 2018-04-07 ENCOUNTER — Ambulatory Visit (INDEPENDENT_AMBULATORY_CARE_PROVIDER_SITE_OTHER): Payer: Medicare Other | Admitting: Cardiovascular Disease

## 2018-04-07 VITALS — BP 128/78 | HR 84 | Ht 66.5 in | Wt 162.6 lb

## 2018-04-07 DIAGNOSIS — Z01810 Encounter for preprocedural cardiovascular examination: Secondary | ICD-10-CM

## 2018-04-07 DIAGNOSIS — Z8542 Personal history of malignant neoplasm of other parts of uterus: Secondary | ICD-10-CM

## 2018-04-07 DIAGNOSIS — Z79899 Other long term (current) drug therapy: Secondary | ICD-10-CM | POA: Diagnosis not present

## 2018-04-07 DIAGNOSIS — IMO0002 Reserved for concepts with insufficient information to code with codable children: Secondary | ICD-10-CM

## 2018-04-07 DIAGNOSIS — E785 Hyperlipidemia, unspecified: Secondary | ICD-10-CM

## 2018-04-07 DIAGNOSIS — M329 Systemic lupus erythematosus, unspecified: Secondary | ICD-10-CM | POA: Diagnosis not present

## 2018-04-07 DIAGNOSIS — Z8249 Family history of ischemic heart disease and other diseases of the circulatory system: Secondary | ICD-10-CM

## 2018-04-07 MED ORDER — TRANEXAMIC ACID 1000 MG/10ML IV SOLN
1000.0000 mg | INTRAVENOUS | Status: AC
  Administered 2018-04-08: 1000 mg via INTRAVENOUS
  Filled 2018-04-07: qty 1100

## 2018-04-07 MED ORDER — TRANEXAMIC ACID 1000 MG/10ML IV SOLN
2000.0000 mg | INTRAVENOUS | Status: AC
  Administered 2018-04-08: 2000 mg via TOPICAL
  Filled 2018-04-07: qty 20

## 2018-04-07 MED ORDER — BUPIVACAINE LIPOSOME 1.3 % IJ SUSP
20.0000 mL | INTRAMUSCULAR | Status: AC
  Administered 2018-04-08: 20 mL
  Filled 2018-04-07: qty 20

## 2018-04-07 MED ORDER — VANCOMYCIN HCL IN DEXTROSE 1-5 GM/200ML-% IV SOLN
1000.0000 mg | INTRAVENOUS | Status: AC
  Administered 2018-04-08: 1000 mg via INTRAVENOUS
  Filled 2018-04-07: qty 200

## 2018-04-07 MED ORDER — LACTATED RINGERS IV SOLN
INTRAVENOUS | Status: DC
  Administered 2018-04-08: 06:00:00 via INTRAVENOUS

## 2018-04-07 MED ORDER — ROSUVASTATIN CALCIUM 5 MG PO TABS: 5.0000 mg | ORAL_TABLET | Freq: Every day | ORAL | 3 refills | Status: DC

## 2018-04-07 NOTE — Anesthesia Preprocedure Evaluation (Addendum)
Anesthesia Evaluation  Patient identified by MRN, date of birth, ID band Patient awake    Reviewed: Allergy & Precautions, NPO status , Patient's Chart, lab work & pertinent test results  History of Anesthesia Complications (+) PONV and history of anesthetic complications  Airway Mallampati: II  TM Distance: <3 FB Neck ROM: Full    Dental no notable dental hx. (+) Teeth Intact, Caps   Pulmonary sleep apnea , pneumonia, resolved,    Pulmonary exam normal breath sounds clear to auscultation       Cardiovascular + Peripheral Vascular Disease  negative cardio ROS Normal cardiovascular exam Rhythm:Regular Rate:Normal  EKG 03/31/18: NSR. Possible Inferior infarct, age undetermined  Exercise tolerance test 10/01/15:  - ETT with fair exercise tolerance (7:00); no chest pain; normal BP response; no ST changes; negative adequate ETT.  Echo 08/13/11:  1.  Mild concentric LVH.  LV systolic function normal.  EF >55%.  Transmitral spectral Doppler flow pattern suggestive of impaired LV relaxation. 2.  LA size is normal. 3.  Mild mitral annular calcification. 4.  No significant valvular disease.     Neuro/Psych  Headaches, Vasomotor flushing negative psych ROS   GI/Hepatic Neg liver ROS, GERD  Medicated and Controlled,Fecal incontinence   Endo/Other  Hypothyroidism Hyperlipidemia  Renal/GU negative Renal ROS  negative genitourinary   Musculoskeletal  (+) Arthritis , Osteoarthritis,  Loose unicompartment knee arthroplasty " pinched nerve C6-7 with occassional R arm radiculopathy"   Abdominal   Peds negative pediatric ROS (+)  Hematology negative hematology ROS (+) SLE   Anesthesia Other Findings LOOKS anterior  Reproductive/Obstetrics negative OB ROS Hx/o endometrial Ca                            Anesthesia Physical  Anesthesia Plan  ASA: III  Anesthesia Plan: Spinal   Post-op Pain  Management:  Regional for Post-op pain   Induction:   PONV Risk Score and Plan: 3  Airway Management Planned: Natural Airway, Simple Face Mask and Nasal Cannula  Additional Equipment:   Intra-op Plan:   Post-operative Plan:   Informed Consent: I have reviewed the patients History and Physical, chart, labs and discussed the procedure including the risks, benefits and alternatives for the proposed anesthesia with the patient or authorized representative who has indicated his/her understanding and acceptance.   Dental advisory given  Plan Discussed with: CRNA, Anesthesiologist and Surgeon  Anesthesia Plan Comments: (Position neck in neutral position)        Anesthesia Quick Evaluation

## 2018-04-07 NOTE — Patient Instructions (Addendum)
Medication Instructions:  START rosuvastatin daily --take 5 mg daily for 4 weeks, if tolerating okay increase to 10 mg daily  Labwork: Please return for FASTING labs in 3 months (CMET, CBC, Lipid, TSH)  Our in office lab hours are Monday-Friday 8:00-4:00, closed for lunch 12:45-1:45 pm.  No appointment needed.  Follow-Up: 4 months with Dr. Claiborne Billings  Any Other Special Instructions Will Be Listed Below (If Applicable).     If you need a refill on your cardiac medications before your next appointment, please call your pharmacy.

## 2018-04-07 NOTE — Progress Notes (Signed)
Patient ID: Brooke Williams, female   DOB: [birth year], 72 y.o.   MRN: [id number]     HPI: Brooke Williams is a 72 y.o. female who presents for 45 month follow-up cardiology evaluation and for preoperative clearance prior to knee replacement surgery tomorrow with Dr. Frederik Pear.  Brooke Williams has a history of lupus erythematosus, hyperlipidemia, and strong family history for premature coronary disease. 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 In the past I tried initiating therapy with Simcor 500/20 due to her lipid parameters but she was unable to tolerate this. Subsequently, we tried low-dose Crestor but again she stopped it secondary to development of vague myalgias.  She was diagnosed with endometrial cancer and underwent complete hysterectomy in January 2014.  Initially she did not require chemotherapy or radiation.  However, since I last saw her , she develop recurrence in 2015  and required surgery in February 2015.  She received paclitaxel and carboplatin chemotherapy.  She also has had several procedures for removal of lipoma from her lower back.  She is involved in clinical research study at [facility] evaluating the use of metformin for recurrent cancer.  When I last saw her in December 2016 her sister had recently undergone placement of 2 coronary stents and her mother's twin sister had just undergone CABG surgery.  Denied any chest pain.  She remained active.    Since I last saw her, she apparently had undergone previous left knee replacement several years ago.  She continues to be followed closely at [facility] and she brought with her recent results of her CT scans where she was found to have a stable right lower lobe 1.1 cm nodule.  There was no evidence for  recurrent or metastatic disease in the abdomen or pelvis.  She is scheduled tomorrow to undergo right knee replacement with Dr. Frederik Pear.  Last week as part of her preoperative clearance, and ECG was done.  This showed a's small Q waves in 3 and nondiagnostic diminutive Q wave in aVF.  Because of her ECG she is referred to me today prior to undergoing surgery tomorrow for preoperative clearance. Brooke Williams denies any chest pain.  She denies any exertional dyspnea.  She recently had lab work done in November 2018 at her primary care's office and her cholesterol was elevated at 226 with an LDL of 148.  She apparently was given a prescription for Crestor 5 mg but she never started this.  She developed the flu in January and then required surgery in February for rectal prolapse.  She presents for evaluation.  Past Medical History:  Diagnosis Date  . Arthritis    arthritis-hands. cervical disc degeneration  . Back pain    6th vertebrae has collapse on 7th,pinched nerve feeling occ  . Dizziness    rarely,takes Meclizine if needed  . Endometrial cancer ([place]) 2014  . GERD (gastroesophageal reflux disease)    controls with Zantac  . Headache(784.0)    headaches frequently but states since placed on 2L/m via N/C headaches have improved  . History of colon polyps    benign  . History of staph infection   . Hyperlipidemia    takes krill oil daily  . Hypothyroidism    takes Synthroid daily  . Ileus ([place])  10/2015   was treated  . Joint pain   . Joint swelling   . Lupus ([place])    remission since 2008  . Nocturia   . OSA (obstructive sleep apnea)    sleep study in epic from 11-01-15.Uses Oxygen  . Pneumonia    hx of-within the last 15 yrs  . PONV (postoperative nausea and vomiting)   . Restless leg syndrome    takes Mirapex nightly  . Seasonal allergies    takes Allegra daily and uses Astelin Nasal Spray as needed    Past Surgical History:  Procedure Laterality Date  . ABDOMINAL  HYSTERECTOMY    . BRAIN SURGERY     left cataract  . COLON SURGERY     12/2017  . COLONOSCOPY    . DILATION AND CURETTAGE OF UTERUS     2008-polyp removed  . HAND SURGERY  2006   left thumb surgery with pin  . KNEE ARTHROPLASTY    . lipomas removed from back   40 yrs ago  . MASS EXCISION N/A 10/05/2014   Procedure: EXCISION OF BILATERAL LOWER BACK MASSES;  Surgeon: Coralie Keens, MD;  Location: [facility];  Service: General;  Laterality: N/A;  . MEDIAL PARTIAL KNEE REPLACEMENT  2005   LT knee  . OTHER SURGICAL HISTORY     metal pin/screw to repair arthritis in LT thumb joint  . ROBOTIC ASSISTED TOTAL HYSTERECTOMY WITH BILATERAL SALPINGO OOPHERECTOMY  12/20/2012   Procedure: ROBOTIC ASSISTED TOTAL HYSTERECTOMY WITH BILATERAL SALPINGO OOPHORECTOMY;  Surgeon: Imagene Gurney A. Alycia Rossetti, MD;  Location: [facility];  Service: Gynecology;  Laterality: N/A;  WITH PELVIC PARAAORTIC   . ROBOTIC PELVIC AND PARA-AORTIC LYMPH NODE DISSECTION  12/20/2012   Procedure: ROBOTIC PELVIC AND PARA-AORTIC LYMPH NODE DISSECTION;  Surgeon: Imagene Gurney A. Alycia Rossetti, MD;  Location: [facility];  Service: Gynecology;  Laterality: Bilateral;  . TOTAL KNEE REVISION Left 02/03/2016   Procedure: TOTAL KNEE REVISION;  Surgeon: Frederik Pear, MD;  Location: [facility];  Service: Orthopedics;  Laterality: Left;    Allergies  Allergen Reactions  . Cefdinir Hives, Itching and Rash  . Penicillins Hives    CHILDHOOD REACTION Has patient had a PCN reaction causing immediate rash, facial/tongue/throat swelling, SOB or lightheadedness with hypotension: #  #  NO  #  #  Has patient had a PCN reaction occurring within the last 10 years:#  #  #  YES  #  #  #   If all of the above answers are "NO", then may proceed with Cephalosporin use.  . Tape Itching and Rash    Skin turns red Bandaid  . Codeine Itching, Nausea Only and Other (See Comments)    Facial flushing   . Penicillamine Rash    Current Outpatient Medications  Medication Sig  Dispense Refill  . ASTRAGALUS PO Take 1 capsule by mouth 2 (two) times daily.     Marland Kitchen BLACK COHOSH PO Take 1 capsule by mouth daily.     . diclofenac sodium (VOLTAREN) 1 % GEL Apply 3 gm to 3 large joints up to 3 times a day.Dispense 3 tubes with 3 refills. 3 Tube 1  . doxycycline (VIBRAMYCIN) 50 MG capsule Take 50 mg by mouth daily.  3  . fexofenadine-pseudoephedrine (ALLEGRA-D) 60-120 MG 12 hr tablet Take 1 tablet by mouth 2 (two) times daily as needed (allergies or cold symptoms).     . fluocinonide gel (LIDEX) 0.05 % APPLY TO AFFECTED AREA twice a day as needed for  canker sores.  1  . hyoscyamine (ANASPAZ) 0.125 MG TBDP disintergrating tablet Place 0.125 mg under the tongue 2 (two) times daily as needed for cramping.    . Multiple Vitamin (MULTIVITAMIN WITH MINERALS) TABS Take 1 tablet by mouth daily.    Vladimir Faster Glycol-Propyl Glycol (SYSTANE OP) Apply 1 drop to eye every 8 (eight) hours as needed (dry eyes).    . Psyllium (METAMUCIL FIBER PO) Take 5 capsules by mouth daily.     . ranitidine (ZANTAC) 150 MG tablet Take 150 mg by mouth daily.     Marland Kitchen SYNTHROID 75 MCG tablet Take 75 mcg by mouth daily before breakfast.     . triamcinolone cream (KENALOG) 0.1 % Apply 1 application topically 2 (two) times daily as needed (rash).     . rosuvastatin (CRESTOR) 5 MG tablet Take 1 tablet (5 mg total) by mouth daily. 90 tablet 3   No current facility-administered medications for this visit.    Facility-Administered Medications Ordered in Other Visits  Medication Dose Route Frequency Provider Last Rate Last Dose  . [START ON 04/08/2018] bupivacaine liposome (EXPAREL) 1.3 % injection 266 mg  20 mL Infiltration To OR Frederik Pear, MD      . Derrill Memo ON 04/08/2018] lactated ringers infusion   Intravenous Continuous Frederik Pear, MD      . Derrill Memo ON 04/08/2018] tranexamic acid (CYKLOKAPRON) 1,000 mg in sodium chloride 0.9 % 100 mL IVPB  1,000 mg Intravenous To OR Frederik Pear, MD      . Derrill Memo ON 04/08/2018]  tranexamic acid (CYKLOKAPRON) 2,000 mg in sodium chloride 0.9 % 50 mL Topical Application  1,191 mg Topical To OR Frederik Pear, MD      . Derrill Memo ON 04/08/2018] vancomycin (VANCOCIN) IVPB 1000 mg/200 mL premix  1,000 mg Intravenous To Joellyn Haff, MD        Social History   Socioeconomic History  . Marital status: Married    Spouse name: Not on file  . Number of children: 0  . Years of education: Not on file  . Highest education level: Not on file  Occupational History  . Not on file  Social Needs  . Financial resource strain: Not on file  . Food insecurity:    Worry: Not on file    Inability: Not on file  . Transportation needs:    Medical: Not on file    Non-medical: Not on file  Tobacco Use  . Smoking status: Never Smoker  . Smokeless tobacco: Never Used  Substance and Sexual Activity  . Alcohol use: Yes    Comment: occassional  . Drug use: No  . Sexual activity: Not Currently    Birth control/protection: Post-menopausal  Lifestyle  . Physical activity:    Days per week: Not on file    Minutes per session: Not on file  . Stress: Not on file  Relationships  . Social connections:    Talks on phone: Not on file    Gets together: Not on file    Attends religious service: Not on file    Active member of club or organization: Not on file    Attends meetings of clubs or organizations: Not on file    Relationship status: Not on file  . Intimate partner violence:    Fear of current or ex partner: Not on file    Emotionally abused: Not on file    Physically abused: Not on file    Forced sexual activity: Not on file  Other Topics Concern  . Not on file  Social History Narrative   Patient does not have any biological children but has step children    Family History  Problem Relation Age of Onset  . Stroke Mother 60       arterial wall of artery in eye   . Endometrial cancer Mother 88  . Dementia Father   . Stroke Father        "lots of many strokes"  .  Aneurysm Father 49       AAA  . Multiple births Maternal Grandmother        died 5 days after delivery of unknown causes  . Colon cancer Maternal Aunt        late 61s  . Cancer Cousin        maternal cousin with an unknown form of cancer   Additional social history is that I take care of her husband Mr. Lu Paradise. There is no history of ever smoking tobacco. Occasional alcohol use.   ROS General: Negative; No fevers, chills, or night sweats;  HEENT: Negative; No changes in vision or hearing, sinus congestion, difficulty swallowing Pulmonary: Negative; No cough, wheezing, shortness of breath, hemoptysis Cardiovascular: Negative; No chest pain, presyncope, syncope, palpitations GI: Negative; No nausea, vomiting, diarrhea, or abdominal pain GU: Negative; No dysuria, hematuria, or difficulty voiding Musculoskeletal: Negative; no myalgias, joint pain, or weakness Hematologic/Oncology: Positive for endometrial cancer Endocrine: Negative; no heat/cold intolerance; no diabetes Neuro: Negative; no changes in balance, headaches Skin: Negative; No rashes or skin lesions Psychiatric: Negative; No behavioral problems, depression Sleep: Negative; No snoring, daytime sleepiness, hypersomnolence, bruxism, restless legs, hypnogognic hallucinations, no cataplexy Other comprehensive 14 point system review is negative.  PE BP 128/78   Pulse 84   Ht 5' 6.5" (1.689 m)   Wt 162 lb 9.6 oz (73.8 kg)   BMI 25.85 kg/m    Repeat blood pressure by me was 126/76  Wt Readings from Last 3 Encounters:  04/07/18 162 lb 9.6 oz (73.8 kg)  03/31/18 164 lb 1.6 oz (74.4 kg)  09/28/17 171 lb (77.6 kg)   General: Alert, oriented, no distress.  Skin: normal turgor, no rashes, warm and dry HEENT: Normocephalic, atraumatic. Pupils equal round and reactive to light; sclera anicteric; extraocular muscles intact; Nose without nasal septal hypertrophy Mouth/Parynx benign; Mallinpatti scale 2 Neck: No JVD, no  carotid bruits; normal carotid upstroke Lungs: clear to ausculatation and percussion; no wheezing or rales Chest wall: without tenderness to palpitation Heart: PMI not displaced, RRR, s1 s2 normal, 1/6 systolic murmur, no diastolic murmur, no rubs, gallops, thrills, or heaves Abdomen: soft, nontender; no hepatosplenomehaly, BS+; abdominal aorta nontender and not dilated by palpation. Back: no CVA tenderness Pulses 2+ Musculoskeletal: full range of motion, normal strength, no joint deformities Extremities: no clubbing cyanosis or edema, Homan's sign negative  Neurologic: grossly nonfocal; Cranial nerves grossly wnl Psychologic: Normal mood and affect   ECG (independently read by me): Normal sinus rhythm at 84 bpm.  Possible borderline left atrial enlargement.  No ectopy. Normal intervals.  No evidence for prior MI.  Q waves in lead III.  Normal R waves in lead II and aVF  September 2016 ECG (independently read by me): Normal sinus rhythm at 74 bpm.  No ectopy.  Normal intervals.  ECG: Sinus rhythm at 69 beats per minute. No ectopy. Normal intervals.  LABS: BMP Latest Ref Rng & Units 03/31/2018 07/29/2017 02/04/2016  Glucose 65 - 99 mg/dL 95 101(H) 124(H)  BUN 6 - 20 mg/dL 24(H) 18 10  Creatinine 0.44 - 1.00 mg/dL 0.74 0.64 0.67  Sodium 135 - 145 mmol/L 141 141 142  Potassium 3.5 - 5.1 mmol/L 3.9 4.7 4.3  Chloride 101 - 111 mmol/L 106 107 106  CO2 22 - 32 mmol/L 28 28 24   Calcium 8.9 - 10.3 mg/dL 9.8 9.5 9.2   Hepatic Function Latest Ref Rng & Units 07/29/2017 11/03/2015 09/06/2015  Total Protein 6.5 - 8.1 g/dL 7.1 7.4 6.1  Albumin 3.5 - 5.0 g/dL 4.2 4.6 4.0  AST 15 - 41 U/L 52(H) 34 21  ALT 14 - 54 U/L 61(H) 39 29  Alk Phosphatase 38 - 126 U/L 100 96 65  Total Bilirubin 0.3 - 1.2 mg/dL 0.6 0.8 0.5   CBC Latest Ref Rng & Units 03/31/2018 07/29/2017 02/05/2016  WBC 4.0 - 10.5 K/uL 7.8 7.0 6.7  Hemoglobin 12.0 - 15.0 g/dL 14.2 13.7 11.0(L)  Hematocrit 36.0 - 46.0 % 43.2 39.9 34.6(L)    Platelets 150 - 400 K/uL 257 229 198   Lab Results  Component Value Date   MCV 92.9 03/31/2018   MCV 91.5 07/29/2017   MCV 93.0 02/05/2016   Lab Results  Component Value Date   TSH 3.618 09/06/2015    No results found for: HGBA1C   Lipid Panel     Component Value Date/Time   CHOL 216 (H) 09/06/2015 0914   TRIG 160 (H) 09/06/2015 0914   HDL 58 09/06/2015 0914   CHOLHDL 3.7 09/06/2015 0914   VLDL 32 (H) 09/06/2015 0914   LDLCALC 126 09/06/2015 0914     RADIOLOGY: No results found.  Prior  2016 NMR lipoprotein: significant increase LDL particle #2251, LDL calculated 152, HDL 54, triglycerides 180, total cholesterol 242, small LDL particle #1427, and insulin resistance coronary increased at 49.   IMPRESSION:  1. Hyperlipidemia, unspecified hyperlipidemia type   2. Encounter for pre-operative cardiovascular clearance   3. Medication management   4. Family history of premature CAD   5. History of endometrial cancer   6. History of lupus ([place])     ASSESSMENT AND PLAN: Mrs. Aishwarya Shiplett is a 72 year old female who has a strong family history for premature coronary artery disease and when I last saw her in 2017 several family members had undergone cardiovascular interventions.  She has a history of hyperlipidemia and remotely had tried statins.  When I last saw her in 2016 I had suggested possible initiation of Livalo.  Apparently, she has not been on any lipid-lowering therapy but when seen at her primary care's office in November her LDL cholesterol was elevated at 148 and she was given a prescription for Crestor 5 mg which she never instituted.  She remains entirely asymptomatic without chest pain or exertional change in symptoms.  She denies PND orthopnea.  Her blood pressure today is stable.  Her ECG today is stable.  There is no signs of a prior MI.  She is scheduled to undergo surgery tomorrow.  I have given her clearance to undergo this procedure.  However, once  several weeks have elapsed and she is doing better from a orthopedic standpoint I have suggested a retrial of low-dose statin therapy.  Since she has a prescription for Crestor 5 mg she will start this at this low dose for a month.  If she is able to tolerate the 5 mg dose she will then titrate this to 10 mg.  If she is unable to tolerate a statin, Zetia  should be added.  She may also be a candidate for PCSK9 inhibition therapy with either Repatha or Praluent.  We have notified Dr. Damita Dunnings office regarding her clearance for surgery tomorrow.  Repeat laboratory will be obtained in 3 months and I will see her in the office in follow-up and further recommendations will be made at that time. Time spent: 25 minutes  Troy Sine, MD, [facility]  04/07/2018 1:48 PM

## 2018-04-08 ENCOUNTER — Inpatient Hospital Stay (HOSPITAL_COMMUNITY): Payer: Medicare Other | Admitting: Emergency Medicine

## 2018-04-08 ENCOUNTER — Encounter (HOSPITAL_COMMUNITY)
Admission: RE | Disposition: A | Discharge status: Home / Self Care | Payer: Self-pay | Source: Ambulatory Visit | Attending: Orthopedic Surgery

## 2018-04-08 ENCOUNTER — Observation Stay (HOSPITAL_COMMUNITY)
Admission: RE | Admit: 2018-04-08 | Discharge: 2018-04-09 | Disposition: A | Discharge status: Home / Self Care | Payer: Medicare Other | Source: Ambulatory Visit | Attending: Orthopedic Surgery | Admitting: Orthopedic Surgery

## 2018-04-08 ENCOUNTER — Inpatient Hospital Stay (HOSPITAL_COMMUNITY): Payer: Medicare Other | Admitting: Anesthesiology

## 2018-04-08 ENCOUNTER — Encounter (HOSPITAL_COMMUNITY): Payer: Self-pay

## 2018-04-08 DIAGNOSIS — K219 Gastro-esophageal reflux disease without esophagitis: Secondary | ICD-10-CM | POA: Diagnosis not present

## 2018-04-08 DIAGNOSIS — Z96652 Presence of left artificial knee joint: Secondary | ICD-10-CM | POA: Insufficient documentation

## 2018-04-08 DIAGNOSIS — Z6825 Body mass index (BMI) 25.0-25.9, adult: Secondary | ICD-10-CM | POA: Insufficient documentation

## 2018-04-08 DIAGNOSIS — I739 Peripheral vascular disease, unspecified: Secondary | ICD-10-CM | POA: Insufficient documentation

## 2018-04-08 DIAGNOSIS — M25561 Pain in right knee: Secondary | ICD-10-CM | POA: Diagnosis present

## 2018-04-08 DIAGNOSIS — E669 Obesity, unspecified: Secondary | ICD-10-CM | POA: Insufficient documentation

## 2018-04-08 DIAGNOSIS — M1711 Unilateral primary osteoarthritis, right knee: Principal | ICD-10-CM | POA: Insufficient documentation

## 2018-04-08 DIAGNOSIS — G4733 Obstructive sleep apnea (adult) (pediatric): Secondary | ICD-10-CM | POA: Insufficient documentation

## 2018-04-08 DIAGNOSIS — M179 Osteoarthritis of knee, unspecified: Secondary | ICD-10-CM | POA: Diagnosis present

## 2018-04-08 DIAGNOSIS — M25761 Osteophyte, right knee: Secondary | ICD-10-CM | POA: Insufficient documentation

## 2018-04-08 DIAGNOSIS — M25862 Other specified joint disorders, left knee: Secondary | ICD-10-CM | POA: Insufficient documentation

## 2018-04-08 DIAGNOSIS — Z8542 Personal history of malignant neoplasm of other parts of uterus: Secondary | ICD-10-CM | POA: Diagnosis not present

## 2018-04-08 DIAGNOSIS — M1710 Unilateral primary osteoarthritis, unspecified knee: Secondary | ICD-10-CM | POA: Diagnosis present

## 2018-04-08 DIAGNOSIS — E039 Hypothyroidism, unspecified: Secondary | ICD-10-CM | POA: Insufficient documentation

## 2018-04-08 DIAGNOSIS — G2581 Restless legs syndrome: Secondary | ICD-10-CM | POA: Diagnosis not present

## 2018-04-08 DIAGNOSIS — Z79890 Hormone replacement therapy (postmenopausal): Secondary | ICD-10-CM | POA: Insufficient documentation

## 2018-04-08 DIAGNOSIS — M24662 Ankylosis, left knee: Secondary | ICD-10-CM | POA: Diagnosis present

## 2018-04-08 DIAGNOSIS — E785 Hyperlipidemia, unspecified: Secondary | ICD-10-CM | POA: Diagnosis not present

## 2018-04-08 DIAGNOSIS — Z79899 Other long term (current) drug therapy: Secondary | ICD-10-CM | POA: Insufficient documentation

## 2018-04-08 HISTORY — PX: KNEE ARTHROSCOPY: SHX127

## 2018-04-08 HISTORY — PX: TOTAL KNEE ARTHROPLASTY: SHX125

## 2018-04-08 SURGERY — ARTHROPLASTY, KNEE, TOTAL
Anesthesia: Monitor Anesthesia Care | Site: Knee | Laterality: Right

## 2018-04-08 MED ORDER — ALUM & MAG HYDROXIDE-SIMETH 200-200-20 MG/5ML PO SUSP: 30.0000 mL | ORAL | Status: DC | PRN

## 2018-04-08 MED ORDER — FENTANYL CITRATE (PF) 100 MCG/2ML IJ SOLN
100.0000 ug | Freq: Once | INTRAMUSCULAR | Status: AC
  Administered 2018-04-08: 100 ug via INTRAVENOUS

## 2018-04-08 MED ORDER — DIPHENHYDRAMINE HCL 12.5 MG/5ML PO ELIX: 12.5000 mg | ORAL_SOLUTION | ORAL | Status: DC | PRN

## 2018-04-08 MED ORDER — ROSUVASTATIN CALCIUM 5 MG PO TABS: 5.0000 mg | ORAL_TABLET | Freq: Every day | ORAL | Status: DC

## 2018-04-08 MED ORDER — METHOCARBAMOL 500 MG PO TABS
ORAL_TABLET | ORAL | Status: AC
  Filled 2018-04-08: qty 1

## 2018-04-08 MED ORDER — ONDANSETRON HCL 4 MG/2ML IJ SOLN
INTRAMUSCULAR | Status: DC | PRN
  Administered 2018-04-08: 4 mg via INTRAVENOUS

## 2018-04-08 MED ORDER — METHOCARBAMOL 500 MG PO TABS: 500.0000 mg | ORAL_TABLET | Freq: Four times a day (QID) | ORAL | Status: DC | PRN

## 2018-04-08 MED ORDER — OXYCODONE HCL 5 MG PO TABS
ORAL_TABLET | ORAL | Status: AC
  Filled 2018-04-08: qty 1

## 2018-04-08 MED ORDER — ACETAMINOPHEN 325 MG PO TABS: 325.0000 mg | ORAL_TABLET | Freq: Four times a day (QID) | ORAL | Status: DC | PRN

## 2018-04-08 MED ORDER — EPINEPHRINE PF 1 MG/ML IJ SOLN
INTRAMUSCULAR | Status: DC | PRN
  Administered 2018-04-08: 1 mg

## 2018-04-08 MED ORDER — OXYCODONE HCL 5 MG/5ML PO SOLN: 5.0000 mg | Freq: Once | ORAL | Status: DC | PRN

## 2018-04-08 MED ORDER — PROPOFOL 500 MG/50ML IV EMUL
INTRAVENOUS | Status: DC | PRN
  Administered 2018-04-08: 40 ug/kg/min via INTRAVENOUS

## 2018-04-08 MED ORDER — TRIAMCINOLONE ACETONIDE 0.1 % EX CREA: 1.0000 "application " | TOPICAL_CREAM | Freq: Two times a day (BID) | CUTANEOUS | Status: DC | PRN

## 2018-04-08 MED ORDER — OXYCODONE HCL 5 MG PO TABS: 5.0000 mg | ORAL_TABLET | Freq: Once | ORAL | Status: DC | PRN

## 2018-04-08 MED ORDER — POLYVINYL ALCOHOL 1.4 % OP SOLN: 1.0000 [drp] | Freq: Three times a day (TID) | OPHTHALMIC | Status: DC | PRN

## 2018-04-08 MED ORDER — BUPIVACAINE IN DEXTROSE 0.75-8.25 % IT SOLN
INTRATHECAL | Status: DC | PRN
  Administered 2018-04-08: 1.8 mL via INTRATHECAL

## 2018-04-08 MED ORDER — PSYLLIUM 51.7 % PO PACK: PACK | Freq: Every day | ORAL | Status: DC

## 2018-04-08 MED ORDER — FLEET ENEMA 7-19 GM/118ML RE ENEM: 1.0000 | ENEMA | Freq: Once | RECTAL | Status: DC | PRN

## 2018-04-08 MED ORDER — LEVOTHYROXINE SODIUM 75 MCG PO TABS
75.0000 ug | ORAL_TABLET | Freq: Every day | ORAL | Status: DC
  Administered 2018-04-09: 75 ug via ORAL
  Filled 2018-04-08: qty 1

## 2018-04-08 MED ORDER — HYDROMORPHONE HCL 2 MG/ML IJ SOLN: 0.5000 mg | INTRAMUSCULAR | Status: DC | PRN

## 2018-04-08 MED ORDER — CHLORHEXIDINE GLUCONATE 4 % EX LIQD: 60.0000 mL | Freq: Once | CUTANEOUS | Status: DC

## 2018-04-08 MED ORDER — MIDAZOLAM HCL 2 MG/2ML IJ SOLN
2.0000 mg | Freq: Once | INTRAMUSCULAR | Status: AC
  Administered 2018-04-08: 2 mg via INTRAVENOUS

## 2018-04-08 MED ORDER — MIDAZOLAM HCL 2 MG/2ML IJ SOLN
INTRAMUSCULAR | Status: AC
  Administered 2018-04-08: 2 mg via INTRAVENOUS
  Filled 2018-04-08: qty 2

## 2018-04-08 MED ORDER — SODIUM CHLORIDE 0.9 % IR SOLN
Status: DC | PRN
  Administered 2018-04-08 (×3): 3000 mL

## 2018-04-08 MED ORDER — METOCLOPRAMIDE HCL 5 MG PO TABS: 5.0000 mg | ORAL_TABLET | Freq: Three times a day (TID) | ORAL | Status: DC | PRN

## 2018-04-08 MED ORDER — ASPIRIN EC 81 MG PO TBEC: 81.0000 mg | DELAYED_RELEASE_TABLET | Freq: Two times a day (BID) | ORAL | 0 refills | Status: DC

## 2018-04-08 MED ORDER — FENTANYL CITRATE (PF) 250 MCG/5ML IJ SOLN
INTRAMUSCULAR | Status: AC
  Filled 2018-04-08: qty 5

## 2018-04-08 MED ORDER — ONDANSETRON HCL 4 MG PO TABS: 4.0000 mg | ORAL_TABLET | Freq: Four times a day (QID) | ORAL | Status: DC | PRN

## 2018-04-08 MED ORDER — TIZANIDINE HCL 2 MG PO TABS: 2.0000 mg | ORAL_TABLET | Freq: Four times a day (QID) | ORAL | 0 refills | Status: DC | PRN

## 2018-04-08 MED ORDER — BUPIVACAINE-EPINEPHRINE (PF) 0.5% -1:200000 IJ SOLN
INTRAMUSCULAR | Status: AC
  Filled 2018-04-08: qty 30

## 2018-04-08 MED ORDER — KCL IN DEXTROSE-NACL 20-5-0.45 MEQ/L-%-% IV SOLN
INTRAVENOUS | Status: DC
  Administered 2018-04-08: 100 mL/h via INTRAVENOUS
  Administered 2018-04-08: 21:00:00 via INTRAVENOUS
  Filled 2018-04-08: qty 1000

## 2018-04-08 MED ORDER — HYOSCYAMINE SULFATE 0.125 MG PO TBDP
0.1250 mg | ORAL_TABLET | Freq: Two times a day (BID) | ORAL | Status: DC | PRN
  Filled 2018-04-08: qty 1

## 2018-04-08 MED ORDER — BUPIVACAINE-EPINEPHRINE 0.25% -1:200000 IJ SOLN
INTRAMUSCULAR | Status: AC
  Filled 2018-04-08: qty 1

## 2018-04-08 MED ORDER — MIDAZOLAM HCL 2 MG/2ML IJ SOLN
INTRAMUSCULAR | Status: AC
  Filled 2018-04-08: qty 2

## 2018-04-08 MED ORDER — HYDROMORPHONE HCL 1 MG/ML IJ SOLN: 0.5000 mg | INTRAMUSCULAR | Status: DC | PRN

## 2018-04-08 MED ORDER — MEPERIDINE HCL 50 MG/ML IJ SOLN: 6.2500 mg | INTRAMUSCULAR | Status: DC | PRN

## 2018-04-08 MED ORDER — CELECOXIB 200 MG PO CAPS
200.0000 mg | ORAL_CAPSULE | Freq: Two times a day (BID) | ORAL | Status: DC
  Administered 2018-04-08 – 2018-04-09 (×2): 200 mg via ORAL
  Filled 2018-04-08 (×2): qty 1

## 2018-04-08 MED ORDER — ACETAMINOPHEN 160 MG/5ML PO SOLN: 325.0000 mg | ORAL | Status: DC | PRN

## 2018-04-08 MED ORDER — ACETAMINOPHEN 325 MG PO TABS: 325.0000 mg | ORAL_TABLET | ORAL | Status: DC | PRN

## 2018-04-08 MED ORDER — FENTANYL CITRATE (PF) 100 MCG/2ML IJ SOLN
INTRAMUSCULAR | Status: AC
  Administered 2018-04-08: 100 ug via INTRAVENOUS
  Filled 2018-04-08: qty 2

## 2018-04-08 MED ORDER — BISACODYL 5 MG PO TBEC: 5.0000 mg | DELAYED_RELEASE_TABLET | Freq: Every day | ORAL | Status: DC | PRN

## 2018-04-08 MED ORDER — BUPIVACAINE-EPINEPHRINE (PF) 0.25% -1:200000 IJ SOLN
INTRAMUSCULAR | Status: DC | PRN
  Administered 2018-04-08: 50 mL

## 2018-04-08 MED ORDER — SODIUM CHLORIDE 0.9 % IJ SOLN
INTRAMUSCULAR | Status: DC | PRN
  Administered 2018-04-08: 50 mL via INTRAVENOUS

## 2018-04-08 MED ORDER — METHOCARBAMOL 1000 MG/10ML IJ SOLN
500.0000 mg | Freq: Four times a day (QID) | INTRAVENOUS | Status: DC | PRN
  Filled 2018-04-08: qty 5

## 2018-04-08 MED ORDER — FENTANYL CITRATE (PF) 100 MCG/2ML IJ SOLN: 25.0000 ug | INTRAMUSCULAR | Status: DC | PRN

## 2018-04-08 MED ORDER — TRANEXAMIC ACID 1000 MG/10ML IV SOLN
1000.0000 mg | Freq: Once | INTRAVENOUS | Status: DC
  Filled 2018-04-08: qty 10

## 2018-04-08 MED ORDER — ROPIVACAINE HCL 7.5 MG/ML IJ SOLN
INTRAMUSCULAR | Status: DC | PRN
  Administered 2018-04-08: 25 mL via PERINEURAL

## 2018-04-08 MED ORDER — METOCLOPRAMIDE HCL 5 MG/ML IJ SOLN: 5.0000 mg | Freq: Three times a day (TID) | INTRAMUSCULAR | Status: DC | PRN

## 2018-04-08 MED ORDER — ASPIRIN EC 325 MG PO TBEC
325.0000 mg | DELAYED_RELEASE_TABLET | Freq: Every day | ORAL | Status: DC
  Administered 2018-04-09: 325 mg via ORAL
  Filled 2018-04-08: qty 1

## 2018-04-08 MED ORDER — POLYETHYL GLYCOL-PROPYL GLYCOL 0.4-0.3 % OP GEL: Freq: Three times a day (TID) | OPHTHALMIC | Status: DC | PRN

## 2018-04-08 MED ORDER — DOCUSATE SODIUM 100 MG PO CAPS
100.0000 mg | ORAL_CAPSULE | Freq: Two times a day (BID) | ORAL | Status: DC
  Filled 2018-04-08: qty 1

## 2018-04-08 MED ORDER — POLYETHYLENE GLYCOL 3350 17 G PO PACK: 17.0000 g | PACK | Freq: Every day | ORAL | Status: DC | PRN

## 2018-04-08 MED ORDER — OXYCODONE-ACETAMINOPHEN 5-325 MG PO TABS: 1.0000 | ORAL_TABLET | ORAL | 0 refills | Status: DC | PRN

## 2018-04-08 MED ORDER — KCL IN DEXTROSE-NACL 20-5-0.45 MEQ/L-%-% IV SOLN
INTRAVENOUS | Status: AC
  Administered 2018-04-08: 100 mL/h via INTRAVENOUS
  Filled 2018-04-08: qty 1000

## 2018-04-08 MED ORDER — GABAPENTIN 300 MG PO CAPS
300.0000 mg | ORAL_CAPSULE | Freq: Three times a day (TID) | ORAL | Status: DC
  Filled 2018-04-08 (×2): qty 1

## 2018-04-08 MED ORDER — PANTOPRAZOLE SODIUM 40 MG PO TBEC
40.0000 mg | DELAYED_RELEASE_TABLET | Freq: Every day | ORAL | Status: DC
  Administered 2018-04-09: 40 mg via ORAL
  Filled 2018-04-08: qty 1

## 2018-04-08 MED ORDER — DOXYCYCLINE HYCLATE 50 MG PO CAPS
50.0000 mg | ORAL_CAPSULE | Freq: Every day | ORAL | Status: DC
  Administered 2018-04-09: 50 mg via ORAL
  Filled 2018-04-08: qty 1

## 2018-04-08 MED ORDER — ONDANSETRON HCL 4 MG/2ML IJ SOLN
4.0000 mg | Freq: Four times a day (QID) | INTRAMUSCULAR | Status: DC | PRN
  Administered 2018-04-08: 4 mg via INTRAVENOUS
  Filled 2018-04-08: qty 2

## 2018-04-08 MED ORDER — LORATADINE 10 MG PO TABS: 10.0000 mg | ORAL_TABLET | Freq: Every day | ORAL | Status: DC | PRN

## 2018-04-08 MED ORDER — ZOLPIDEM TARTRATE 5 MG PO TABS: 5.0000 mg | ORAL_TABLET | Freq: Every evening | ORAL | Status: DC | PRN

## 2018-04-08 MED ORDER — MENTHOL 3 MG MT LOZG: 1.0000 | LOZENGE | OROMUCOSAL | Status: DC | PRN

## 2018-04-08 MED ORDER — PROPOFOL 10 MG/ML IV BOLUS
INTRAVENOUS | Status: AC
  Filled 2018-04-08: qty 40

## 2018-04-08 MED ORDER — EPINEPHRINE PF 1 MG/ML IJ SOLN
INTRAMUSCULAR | Status: AC
  Filled 2018-04-08: qty 1

## 2018-04-08 MED ORDER — ONDANSETRON HCL 4 MG/2ML IJ SOLN: 4.0000 mg | Freq: Once | INTRAMUSCULAR | Status: DC | PRN

## 2018-04-08 MED ORDER — PSYLLIUM 95 % PO PACK
1.0000 | PACK | Freq: Every day | ORAL | Status: DC
  Filled 2018-04-08: qty 1

## 2018-04-08 MED ORDER — OXYCODONE HCL 5 MG PO TABS
5.0000 mg | ORAL_TABLET | ORAL | Status: DC | PRN
  Administered 2018-04-08 – 2018-04-09 (×3): 10 mg via ORAL
  Filled 2018-04-08 (×3): qty 2

## 2018-04-08 MED ORDER — PHENOL 1.4 % MT LIQD: 1.0000 | OROMUCOSAL | Status: DC | PRN

## 2018-04-08 MED ORDER — FAMOTIDINE 20 MG PO TABS
20.0000 mg | ORAL_TABLET | Freq: Every day | ORAL | Status: DC
Start: 2018-04-08 — End: 2018-04-09
  Administered 2018-04-09: 20 mg via ORAL
  Filled 2018-04-08: qty 1

## 2018-04-08 SURGICAL SUPPLY — 68 items
BANDAGE ACE 6X5 VEL STRL LF (GAUZE/BANDAGES/DRESSINGS) ×3 IMPLANT
BANDAGE ELASTIC 6 VELCRO ST LF (GAUZE/BANDAGES/DRESSINGS) ×3 IMPLANT
BANDAGE ESMARK 6X9 LF (GAUZE/BANDAGES/DRESSINGS) ×2 IMPLANT
BLADE CUTTER GATOR 3.5 (BLADE) ×3 IMPLANT
BLADE GREAT WHITE 4.2 (BLADE) ×3 IMPLANT
BLADE SAG 18X100X1.27 (BLADE) ×3 IMPLANT
BLADE SAGITTAL 13X1.27X60 (BLADE) IMPLANT
BLADE SAW SGTL 13X75X1.27 (BLADE) IMPLANT
BNDG ELASTIC 6X10 VLCR STRL LF (GAUZE/BANDAGES/DRESSINGS) ×3 IMPLANT
BNDG ESMARK 6X9 LF (GAUZE/BANDAGES/DRESSINGS) ×3
BOWL SMART MIX CTS (DISPOSABLE) ×3 IMPLANT
BUR OVAL 6.0 (BURR) IMPLANT
CAPT KNEE TOTAL 3 ATTUNE ×3 IMPLANT
CEMENT HV SMART SET (Cement) ×6 IMPLANT
COVER SURGICAL LIGHT HANDLE (MISCELLANEOUS) ×3 IMPLANT
CUFF TOURNIQUET SINGLE 34IN LL (TOURNIQUET CUFF) ×3 IMPLANT
DRAPE ARTHROSCOPY W/POUCH 114 (DRAPES) IMPLANT
DRAPE EXTREMITY BILATERAL (DRAPES) ×3 IMPLANT
DRAPE EXTREMITY T 121X128X90 (DRAPE) ×3 IMPLANT
DRAPE STERI 35X30 U-POUCH (DRAPES) ×3 IMPLANT
DRAPE U-SHAPE 47X51 STRL (DRAPES) ×3 IMPLANT
DRESSING AQUACEL AG SP 3.5X10 (GAUZE/BANDAGES/DRESSINGS) ×2 IMPLANT
DRSG AQUACEL AG ADV 3.5X10 (GAUZE/BANDAGES/DRESSINGS) ×3 IMPLANT
DRSG AQUACEL AG SP 3.5X10 (GAUZE/BANDAGES/DRESSINGS) ×3
DURAPREP 26ML APPLICATOR (WOUND CARE) ×6 IMPLANT
ELECT REM PT RETURN 9FT ADLT (ELECTROSURGICAL) ×3
ELECTRODE REM PT RTRN 9FT ADLT (ELECTROSURGICAL) ×2 IMPLANT
GAUZE SPONGE 4X4 12PLY STRL (GAUZE/BANDAGES/DRESSINGS) ×3 IMPLANT
GAUZE XEROFORM 1X8 LF (GAUZE/BANDAGES/DRESSINGS) ×3 IMPLANT
GLOVE BIO SURGEON STRL SZ7.5 (GLOVE) ×3 IMPLANT
GLOVE BIO SURGEON STRL SZ8.5 (GLOVE) ×6 IMPLANT
GLOVE BIOGEL PI IND STRL 8 (GLOVE) ×4 IMPLANT
GLOVE BIOGEL PI IND STRL 9 (GLOVE) ×2 IMPLANT
GLOVE BIOGEL PI INDICATOR 8 (GLOVE) ×2
GLOVE BIOGEL PI INDICATOR 9 (GLOVE) ×1
GOWN STRL REUS W/ TWL LRG LVL3 (GOWN DISPOSABLE) ×6 IMPLANT
GOWN STRL REUS W/ TWL XL LVL3 (GOWN DISPOSABLE) ×4 IMPLANT
GOWN STRL REUS W/TWL LRG LVL3 (GOWN DISPOSABLE) ×3
GOWN STRL REUS W/TWL XL LVL3 (GOWN DISPOSABLE) ×2
HANDPIECE INTERPULSE COAX TIP (DISPOSABLE) ×1
HOOD PEEL AWAY FACE SHEILD DIS (HOOD) ×9 IMPLANT
KIT BASIN OR (CUSTOM PROCEDURE TRAY) ×3 IMPLANT
KIT TURNOVER KIT B (KITS) ×3 IMPLANT
MANIFOLD NEPTUNE II (INSTRUMENTS) ×3 IMPLANT
NEEDLE 22X1 1/2 (OR ONLY) (NEEDLE) ×6 IMPLANT
NS IRRIG 1000ML POUR BTL (IV SOLUTION) ×3 IMPLANT
PACK ARTHROSCOPY DSU (CUSTOM PROCEDURE TRAY) ×3 IMPLANT
PACK TOTAL JOINT (CUSTOM PROCEDURE TRAY) ×3 IMPLANT
PAD ABD 8X10 STRL (GAUZE/BANDAGES/DRESSINGS) ×3 IMPLANT
PAD ARMBOARD 7.5X6 YLW CONV (MISCELLANEOUS) ×6 IMPLANT
PROBE BIPOLAR ATHRO 135MM 90D (MISCELLANEOUS) ×3 IMPLANT
SET ARTHROSCOPY TUBING (MISCELLANEOUS) ×2
SET ARTHROSCOPY TUBING LN (MISCELLANEOUS) ×4 IMPLANT
SET HNDPC FAN SPRY TIP SCT (DISPOSABLE) ×2 IMPLANT
SPONGE LAP 4X18 X RAY DECT (DISPOSABLE) ×3 IMPLANT
SUT VIC AB 0 CT1 27 (SUTURE) ×1
SUT VIC AB 0 CT1 27XBRD ANBCTR (SUTURE) ×2 IMPLANT
SUT VIC AB 1 CTX 36 (SUTURE) ×1
SUT VIC AB 1 CTX36XBRD ANBCTR (SUTURE) ×2 IMPLANT
SUT VIC AB 2-0 CT1 27 (SUTURE) ×1
SUT VIC AB 2-0 CT1 TAPERPNT 27 (SUTURE) ×2 IMPLANT
SUT VIC AB 3-0 CT1 27 (SUTURE) ×1
SUT VIC AB 3-0 CT1 TAPERPNT 27 (SUTURE) ×2 IMPLANT
SYR CONTROL 10ML LL (SYRINGE) ×6 IMPLANT
TOWEL OR 17X24 6PK STRL BLUE (TOWEL DISPOSABLE) ×6 IMPLANT
TOWEL OR 17X26 10 PK STRL BLUE (TOWEL DISPOSABLE) ×3 IMPLANT
TRAY CATH 16FR W/PLASTIC CATH (SET/KITS/TRAYS/PACK) IMPLANT
WATER STERILE IRR 1000ML POUR (IV SOLUTION) ×3 IMPLANT

## 2018-04-08 NOTE — Interval H&P Note (Signed)
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 After consideration of risks, benefits and other options for treatment, the patient has consented to  Procedure(s) with comments: RIGHT TOTAL KNEE ARTHROPLASTY (Right) - Right TKA and Left Knee Arthroscopy ARTHROSCOPY LEFT KNEE (Left) as a surgical intervention .  The patient's history has been reviewed, patient examined, no change in status, stable for surgery.  I have reviewed the patient's chart and labs.  Questions were answered to the patient's satisfaction.     Kerin Salen

## 2018-04-08 NOTE — Plan of Care (Signed)
  Problem: Education: Goal: Knowledge of General Education information will improve Outcome: Progressing   

## 2018-04-08 NOTE — Anesthesia Procedure Notes (Signed)
Spinal  Patient location during procedure: OR Start time: 04/08/2018 7:40 AM End time: 04/08/2018 7:50 AM Staffing Anesthesiologist: Janeece Riggers, MD Preanesthetic Checklist Completed: patient identified, site marked, surgical consent, pre-op evaluation, timeout performed, IV checked, risks and benefits discussed and monitors and equipment checked Spinal Block Patient position: sitting Prep: DuraPrep Patient monitoring: heart rate, cardiac monitor, continuous pulse ox and blood pressure Approach: midline Location: L4-5 Injection technique: single-shot Needle Needle type: Sprotte  Needle gauge: 24 G Needle length: 9 cm Assessment Sensory level: T4

## 2018-04-08 NOTE — Care Plan (Signed)
Patient seen at the bedside. Very alert and oriented. States she is doing very well. Waiting on therapy.  She will discharge to home. She has all needed equipment at home. HHPT provided by [facility] for 5 visits and transition to [place] [facility] on 04/19/18 @ 320 following her MD appointment at 200   Please contact Ladell Heads, [place] with questions or if this plan should need to change.

## 2018-04-08 NOTE — Anesthesia Postprocedure Evaluation (Signed)
Anesthesia Post Note  Patient: Brooke Williams  Procedure(s) Performed: RIGHT TOTAL KNEE ARTHROPLASTY (Right Knee) ARTHROSCOPY LEFT KNEE (Left Knee)     Patient location during evaluation: PACU Anesthesia Type: MAC and Spinal Level of consciousness: oriented and awake and alert Pain management: pain level controlled Vital Signs Assessment: post-procedure vital signs reviewed and stable Respiratory status: spontaneous breathing, respiratory function stable and patient connected to nasal cannula oxygen Cardiovascular status: blood pressure returned to baseline and stable Postop Assessment: no headache, no backache and no apparent nausea or vomiting Anesthetic complications: no    Last Vitals:  Vitals:   04/08/18 1230 04/08/18 1245  BP: 127/83 117/79  Pulse: 72 65  Resp: (!) 22 20  Temp:    SpO2: 100% 100%    Last Pain:  Vitals:   04/08/18 1245  TempSrc:   PainSc: 2                  Bianca Raneri

## 2018-04-08 NOTE — Anesthesia Procedure Notes (Signed)
Anesthesia Regional Block: Adductor canal block   Pre-Anesthetic Checklist: ,, timeout performed, Correct Patient, Correct Site, Correct Laterality, Correct Procedure, Correct Position, site marked, Risks and benefits discussed,  Surgical consent,  Pre-op evaluation,  At surgeon's request and post-op pain management  Laterality: Right  Prep: chloraprep       Needles:  Injection technique: Single-shot  Needle Type: Echogenic Stimulator Needle     Needle Length: 5cm  Needle Gauge: 22     Additional Needles:   Procedures:, nerve stimulator,,, ultrasound used (permanent image in chart),,,,  Narrative:  Start time: 04/08/2018 7:10 AM End time: 04/08/2018 7:25 AM Injection made incrementally with aspirations every 5 mL.  Performed by: Personally  Anesthesiologist: Janeece Riggers, MD  Additional Notes: Functioning IV was confirmed and monitors were applied.  A 65mm 22ga Arrow echogenic stimulator needle was used. Sterile prep and drape,hand hygiene and sterile gloves were used. Ultrasound guidance: relevant anatomy identified, needle position confirmed, local anesthetic spread visualized around nerve(s)., vascular puncture avoided.  Image printed for medical record. Negative aspiration and negative test dose prior to incremental administration of local anesthetic. The patient tolerated the procedure well.

## 2018-04-08 NOTE — Progress Notes (Signed)
Orthopedic Tech Progress Note Patient Details:  Brooke Williams 1946/07/02 [id number]  Ortho Devices Type of Ortho Device: Bone foam zero knee Ortho Device/Splint Interventions: Application   Post Interventions Patient Tolerated: Well Instructions Provided: Care of device   [name] 04/08/2018, 10:38 AM

## 2018-04-08 NOTE — Transfer of Care (Signed)
Immediate Anesthesia Transfer of Care Note  Patient: Brooke Williams  Procedure(s) Performed: RIGHT TOTAL KNEE ARTHROPLASTY (Right Knee) ARTHROSCOPY LEFT KNEE (Left Knee)  Patient Location: PACU  Anesthesia Type:MAC, Regional and Spinal  Level of Consciousness: awake, alert , oriented and sedated  Airway & Oxygen Therapy: Patient Spontanous Breathing and Patient connected to nasal cannula oxygen  Post-op Assessment: Report given to RN, Post -op Vital signs reviewed and stable and Patient moving all extremities  Post vital signs: Reviewed and stable  Last Vitals:  Vitals Value Taken Time  BP 112/66 04/08/2018 10:12 AM  Temp    Pulse 69 04/08/2018 10:14 AM  Resp 15 04/08/2018 10:14 AM  SpO2 100 % 04/08/2018 10:14 AM  Vitals shown include unvalidated device data.  Last Pain:  Vitals:   04/08/18 0720  TempSrc:   PainSc: 0-No pain      Patients Stated Pain Goal: 5 (54/00/86 7619)  Complications: No apparent anesthesia complications

## 2018-04-08 NOTE — Discharge Instructions (Signed)

## 2018-04-08 NOTE — Op Note (Signed)
PATIENT ID:      Brooke Williams  MRN:     [id number] DOB/AGE:    Feb 08, 1946 / 72 y.o.            OPERATIVE REPORT    DATE OF PROCEDURE:  04/08/2018       PREOPERATIVE DIAGNOSIS:   RIGHT KNEE OSTEOARTHRITIS AND LEFT TOTAL KNEE FIBROUS BANDS      Estimated body mass index is 25.76 kg/m as calculated from the following:   Height as of this encounter: 5' 6.5" (1.689 m).   Weight as of this encounter: 162 lb (73.5 kg).                                                        POSTOPERATIVE DIAGNOSIS:   RIGHT KNEE OSTEOARTHRITIS AND LEFT TOTAL KNEE fibrous band syndrome.                                                                       PROCEDURE:  Procedure(s): ARTHROSCOPY LEFT KNEE with removal of peripatellar fibrous bands. RIGHT TOTAL KNEE ARTHROPLASTY  Using DepuyAttune RP implants #6 right femur, #6 tibia, 5 mm Attune RP bearing, 38 patella     SURGEON: Kerin Salen    ASSISTANT:   Kerry Hough. [employer]   (Present and scrubbed throughout the case, critical for assistance with exposure, retraction, instrumentation, and closure.)         ANESTHESIA: Spinal, 20cc Exparel, 50cc 0.25% Marcaine  EBL: Right knee only 300 cc  FLUID REPLACEMENT: 1600 cc crystalloid  TOURNIQUET TIME: 72min  Drains: None  Tranexamic Acid: 1gm IV, 2gm topical  COMPLICATIONS:  None         INDICATIONS FOR PROCEDURE: The patient has  RIGHT KNEE OSTEOARTHRITIS AND LEFT TOTAL KNEE FIBROUS BANDS, the right knee has varus deformity deformities, XR shows bone on bone arthritis, lateral subluxation of tibia.  The left knee is catching popping and pain that is palpable as you take the knee from extension to flexion consistent with fibrous band syndrome.  Patient got good temporary relief from the fibrous bands after cortisone injection for a few days to a week.  Patient has failed all conservative measures including anti-inflammatory medicines, narcotics, attempts at  exercise and weight loss, cortisone injections  and viscosupplementation.  Risks and benefits of surgery have been discussed, questions answered.   DESCRIPTION OF PROCEDURE: The patient identified by armband, received  IV antibiotics, in the holding area at [facility]. Patient taken to the operating room, appropriate anesthetic  monitors were attached, and spinal anesthesia was  induced. Tourniquet  applied high to the operative thigh. Lateral post and foot positioner  applied to the table, the lower extremity was then prepped and draped  in usual sterile fashion from the toes to the tourniquet.  In a similar fashion the left leg was also prepped and draped simultaneously.Time out procedure was performed. We began the operation on the left side, by making standard inferior lateral and inferior medial peripatellar portals with a #11 blade allowing introduction of the arthroscope through  the inferior lateral portal and the out flow to the inferior medial portal. Pump pressure was set at 60 mmHg and diagnostic arthroscopy  revealed large superior medial and lateral parapatellar fibrous bands.  We placed a supplemental superior medial parapatellar portal and then using a 4.2 gray-white sucker shaver and a Linvatec edge cautery device removed the fibrous bands without difficulty.  Photographic documentation was made of the bands both before and after removal and the knee had a smooth range of motion after removal of the fibrous bands.  We also visualized the femoral and tibial components as well as the post and they were all noted to be in excellent condition and position.. The knee was irrigated out normal saline solution. A dressing of xerofoam 4 x 4 dressing sponges, web roll and an Ace wrap was applied.   We began the operation on the right side, with the knee flexed 120 degrees, by making the anterior midline incision starting at handbreadth above the patella going over the patella 1 cm medial to and 4 cm distal to the tibial tubercle. Small  bleeders in the skin and the  subcutaneous tissue identified and cauterized. Transverse retinaculum was incised and reflected medially and a medial parapatellar arthrotomy was accomplished. the patella was everted and theprepatellar fat pad resected. The superficial medial collateral  ligament was then elevated from anterior to posterior along the proximal  flare of the tibia and anterior half of the menisci resected. The knee was hyperflexed exposing bone on bone arthritis. Peripheral and notch osteophytes as well as the cruciate ligaments were then resected. We continued to  work our way around posteriorly along the proximal tibia, and externally  rotated the tibia subluxing it out from underneath the femur. A McHale  retractor was placed through the notch and a lateral Hohmann retractor  placed, and we then drilled through the proximal tibia in line with the  axis of the tibia followed by an intramedullary guide rod and 2-degree  posterior slope cutting guide. The tibial cutting guide, 3 degree posterior sloped, was pinned into place allowing resection of 5 mm of bone medially and 11 mm of bone laterally. Satisfied with the tibial resection, we then  entered the distal femur 2 mm anterior to the PCL origin with the  intramedullary guide rod and applied the distal femoral cutting guide  set at 9 mm, with 5 degrees of valgus. This was pinned along the  epicondylar axis. At this point, the distal femoral cut was accomplished without difficulty. We then sized for a #6R femoral component and pinned the guide in 3 degrees of external rotation. The chamfer cutting guide was pinned into place. The anterior, posterior, and chamfer cuts were accomplished without difficulty followed by  the Attune RP box cutting guide and the box cut. We also removed posterior osteophytes from the posterior femoral condyles. At this  time, the knee was brought into full extension. We checked our  extension and flexion gaps  and found them symmetric for a 5 mm bearing. Distracting in extension with a lamina spreader, the posterior horns of the menisci were removed, and Exparel, diluted to 60 cc, with 20cc NS, and 20cc 0.5% Marcaine,was injected into the capsule and synovium of the knee. The posterior patella cut was accomplished with the 9.5 mm Attune cutting guide, sized for a 38 mm dome, and the fixation pegs drilled.The knee  was then once again hyperflexed exposing the proximal tibia. We sized for a # 6 tibial base plate,  applied the smokestack and the conical reamer followed by the the Delta fin keel punch. We then hammered into place the Attune RP trial femoral component, drilled the lugs, inserted a  5 mm trial bearing, trial patellar button, and took the knee through range of motion from 0-130 degrees. No thumb pressure was required for patellar Tracking. At this point, the limb was wrapped with an Esmarch bandage and the tourniquet inflated to 350 mmHg. All trial components were removed, mating surfaces irrigated with pulse lavage, and dried with suction and sponges. 10 cc of the Exparel solution was applied to the cancellus bone of the patella distal femur and proximal tibia.  After waiting 1 minute, the bony surfaces were again, dried with sponges. A double batch of DePuy HV cement with 1500 mg of Zinacef was mixed and applied to all bony metallic mating surfaces except for the posterior condyles of the femur itself. In order, we hammered into place the tibial tray and removed excess cement, the femoral component and removed excess cement. The final Attune RP bearing  was inserted, and the knee brought to full extension with compression.  The patellar button was clamped into place, and excess cement  removed. While the cement cured the wound was irrigated out with normal saline solution pulse lavage. Ligament stability and patellar tracking were checked and found to be excellent. The parapatellar arthrotomy was closed  with  running #1 Vicryl suture. The subcutaneous tissue with 0 and 2-0 undyed  Vicryl suture, and the skin with running 3-0 SQ vicryl. A dressing of Xeroform,  4 x 4, dressing sponges, Webril, and Ace wrap applied. The patient  awakened, and taken to recovery room without difficulty.   Kerin Salen 04/08/2018, 9:28 AM

## 2018-04-08 NOTE — Anesthesia Procedure Notes (Signed)
Procedure Name: MAC Date/Time: 04/08/2018 7:32 AM Performed by: Scheryl Darter, CRNA Pre-anesthesia Checklist: Patient identified, Emergency Drugs available, Suction available, Patient being monitored and Timeout performed Patient Re-evaluated:Patient Re-evaluated prior to induction Oxygen Delivery Method: Simple face mask Placement Confirmation: positive ETCO2

## 2018-04-08 NOTE — Evaluation (Signed)
Physical Therapy Evaluation Patient Details Name: Brooke Williams MRN: [id number] DOB: Mar 07, 1946 Today's Date: 04/08/2018   History of Present Illness  Pt is a 72 y/o female s/p R TKA and L knee arthroscopy. PMH including but not limited to L total knee revision 2017, Lupus, HLD.    Clinical Impression  Pt presented supine in bed with HOB elevated, awake and willing to participate in therapy session. Prior to admission, pt reported that she was independent with all functional mobility and ADLs. Pt lives with her spouse who will be able to provide 24/7 supervision/assistance upon d/c if needed. Pt currently requires supervision for bed mobility, min A to min guard for transfers and close min guard with RW for ambulation. Pt with R knee buckling x2 during ambulation requiring min A to recover. Anticipate pt will make good progress with acute PT. Will continue to follow for mobility progression as tolerated. Plan for further ambulation next session and stair training when appropriate.   Pt would continue to benefit from skilled physical therapy services at this time while admitted and after d/c to address the below listed limitations in order to improve overall safety and independence with functional mobility.     Follow Up Recommendations Home health PT;Supervision/Assistance - 24 hour    Equipment Recommendations  Rolling walker with 5" wheels    Recommendations for Other Services       Precautions / Restrictions Precautions Precautions: Fall;Knee Precaution Booklet Issued: Yes (comment) Precaution Comments: PT reviewed LE positioning following TKA sx Restrictions Weight Bearing Restrictions: Yes RLE Weight Bearing: Weight bearing as tolerated      Mobility  Bed Mobility Overal bed mobility: Needs Assistance Bed Mobility: Supine to Sit     Supine to sit: Supervision     General bed mobility comments: supervision for safety, increased time, use of bed  rail  Transfers Overall transfer level: Needs assistance Equipment used: Rolling walker (2 wheeled) Transfers: Sit to/from Stand Sit to Stand: Min guard;Min assist         General transfer comment: cueing for safety hand placement, pt performed from EOB x1 and from [facility] x2, progressing from min A for stability to close min guard  Ambulation/Gait Ambulation/Gait assistance: Min guard;Min assist Ambulation Distance (Feet): 20 Feet(20' x2 with sitting rest break to void) Assistive device: Rolling walker (2 wheeled) Gait Pattern/deviations: Step-to pattern;Step-through pattern;Decreased step length - right;Decreased step length - left;Decreased stride length Gait velocity: decreased Gait velocity interpretation: <1.31 ft/sec, indicative of household ambulator General Gait Details: modest instability with R knee buckling x2 requiring min A to recover; constant close min guard with RW  Stairs            Wheelchair Mobility    Modified Rankin (Stroke Patients Only)       Balance Overall balance assessment: Needs assistance Sitting-balance support: Feet supported Sitting balance-Leahy Scale: Good     Standing balance support: During functional activity;Bilateral upper extremity supported Standing balance-Leahy Scale: Poor                               Pertinent Vitals/Pain Pain Assessment: Faces Faces Pain Scale: Hurts a little bit Pain Location: R knee Pain Descriptors / Indicators: Discomfort Pain Intervention(s): Monitored during session;Repositioned;Patient requesting pain meds-RN notified    Home Living Family/patient expects to be discharged to:: Private residence Living Arrangements: Spouse/significant other Available Help at Discharge: Family;Available 24 hours/day Type of Home: House Home Access: Stairs to  enter Entrance Stairs-Rails: Right;Left Entrance Stairs-Number of Steps: 4 Home Layout: Able to live on main level with  bedroom/bathroom Home Equipment: Shower seat;Cane - single point;Bedside commode      Prior Function Level of Independence: Independent               Hand Dominance        Extremity/Trunk Assessment   Upper Extremity Assessment Upper Extremity Assessment: Overall WFL for tasks assessed    Lower Extremity Assessment Lower Extremity Assessment: Generalized weakness;RLE deficits/detail RLE Deficits / Details: pt with decreased strength and ROM secondary to post-op pain. R knee buckling with ambulation x2 during evaluation       Communication   Communication: No difficulties  Cognition Arousal/Alertness: Awake/alert Behavior During Therapy: WFL for tasks assessed/performed Overall Cognitive Status: Within Functional Limits for tasks assessed                                        General Comments      Exercises Total Joint Exercises Ankle Circles/Pumps: AROM;Both;20 reps;Seated Quad Sets: AROM;Strengthening;Right;10 reps;Seated Long Arc Quad: AROM;Strengthening;Right;10 reps;Seated   Assessment/Plan    PT Assessment Patient needs continued PT services  PT Problem List Decreased strength;Decreased activity tolerance;Decreased range of motion;Decreased balance;Decreased mobility;Decreased coordination;Decreased knowledge of use of DME;Decreased safety awareness;Decreased knowledge of precautions;Pain       PT Treatment Interventions DME instruction;Gait training;Stair training;Therapeutic activities;Therapeutic exercise;Functional mobility training;Balance training;Neuromuscular re-education;Patient/family education    PT Goals (Current goals can be found in the Care Plan section)  Acute Rehab PT Goals Patient Stated Goal: return home PT Goal Formulation: With patient/family Time For Goal Achievement: 04/22/18 Potential to Achieve Goals: Good    Frequency 7X/week   Barriers to discharge        Co-evaluation               AM-PAC PT  "6 Clicks" Daily Activity  Outcome Measure Difficulty turning over in bed (including adjusting bedclothes, sheets and blankets)?: None Difficulty moving from lying on back to sitting on the side of the bed? : None Difficulty sitting down on and standing up from a chair with arms (e.g., wheelchair, bedside commode, etc,.)?: Unable Help needed moving to and from a bed to chair (including a wheelchair)?: A Little Help needed walking in hospital room?: A Little Help needed climbing 3-5 steps with a railing? : A Little 6 Click Score: 18    End of Session Equipment Utilized During Treatment: Gait belt Activity Tolerance: Patient tolerated treatment well Patient left: in chair;with call bell/phone within reach;with family/visitor present Nurse Communication: Mobility status PT Visit Diagnosis: Other abnormalities of gait and mobility (R26.89)    Time: 0354-6568 PT Time Calculation (min) (ACUTE ONLY): 30 min   Charges:   PT Evaluation $PT Eval Moderate Complexity: 1 Mod PT Treatments $Gait Training: 8-22 mins   PT G Codes:        Viola, PT, DPT [place] 04/08/2018, 4:56 PM

## 2018-04-09 DIAGNOSIS — M179 Osteoarthritis of knee, unspecified: Secondary | ICD-10-CM | POA: Diagnosis present

## 2018-04-09 DIAGNOSIS — M1711 Unilateral primary osteoarthritis, right knee: Secondary | ICD-10-CM | POA: Diagnosis not present

## 2018-04-09 DIAGNOSIS — M1710 Unilateral primary osteoarthritis, unspecified knee: Secondary | ICD-10-CM | POA: Diagnosis present

## 2018-04-09 DIAGNOSIS — M24662 Ankylosis, left knee: Secondary | ICD-10-CM | POA: Diagnosis present

## 2018-04-09 LAB — BASIC METABOLIC PANEL
Anion gap: 9 (ref 5–15)
BUN: 12 mg/dL (ref 6–20)
CO2: 23 mmol/L (ref 22–32)
Calcium: 8.6 mg/dL — ABNORMAL LOW (ref 8.9–10.3)
Chloride: 104 mmol/L (ref 101–111)
Creatinine, Ser: 0.68 mg/dL (ref 0.44–1.00)
GFR calc Af Amer: >60 mL/min (ref >60)
GFR calc non Af Amer: >60 mL/min (ref >60)
Glucose, Bld: 110 mg/dL — ABNORMAL HIGH (ref 65–99)
Potassium: 4 mmol/L (ref 3.5–5.1)
Sodium: 136 mmol/L (ref 135–145)

## 2018-04-09 LAB — CBC
HCT: 34.1 % — ABNORMAL LOW (ref 36.0–46.0)
Hemoglobin: 11.1 g/dL — ABNORMAL LOW (ref 12.0–15.0)
MCH: 30.2 pg (ref 26.0–34.0)
MCHC: 32.6 g/dL (ref 30.0–36.0)
MCV: 92.9 fL (ref 78.0–100.0)
Platelets: 184 10*3/uL (ref 150–400)
RBC: 3.67 MIL/uL — ABNORMAL LOW (ref 3.87–5.11)
RDW: 14 % (ref 11.5–15.5)
WBC: 9.4 10*3/uL (ref 4.0–10.5)

## 2018-04-09 NOTE — Discharge Summary (Signed)
Patient ID: Brooke Williams MRN: [id number] DOB/AGE: June 03, 1946 72 y.o.  Admit date: 04/08/2018 Discharge date: 04/09/2018  Admission Diagnoses:  Principal Problem:   Osteoarthritis of right knee Active Problems:   Primary osteoarthritis of right knee   Knee osteoarthritis   Fibrosis of left knee joint   Discharge Diagnoses:  Same  Past Medical History:  Diagnosis Date  . Arthritis    arthritis-hands. cervical disc degeneration  . Back pain    6th vertebrae has collapse on 7th,pinched nerve feeling occ  . Dizziness    rarely,takes Meclizine if needed  . Endometrial cancer ([place]) 2014  . GERD (gastroesophageal reflux disease)    controls with Zantac  . Headache(784.0)    headaches frequently but states since placed on 2L/m via N/C headaches have improved  . History of colon polyps    benign  . History of staph infection   . Hyperlipidemia    takes krill oil daily  . Hypothyroidism    takes Synthroid daily  . Ileus ([place]) 10/2015   was treated  . Joint pain   . Joint swelling   . Lupus ([place])    remission since 2008  . Nocturia   . OSA (obstructive sleep apnea)    sleep study in epic from 11-01-15.Uses Oxygen  . Pneumonia    hx of-within the last 15 yrs  . PONV (postoperative nausea and vomiting)   . Restless leg syndrome    takes Mirapex nightly  . Seasonal allergies    takes Allegra daily and uses Astelin Nasal Spray as needed    Surgeries: Procedure(s): RIGHT TOTAL KNEE ARTHROPLASTY ARTHROSCOPY LEFT KNEE on 04/08/2018   Consultants:   Discharged Condition: Improved  Hospital Course: Brooke Williams is an 72 y.o. female who was admitted 04/08/2018 for operative treatment ofOsteoarthritis of right knee. Patient has severe unremitting pain that affects sleep, daily activities, and work/hobbies. After pre-op clearance the patient was taken to the operating room on 04/08/2018 and underwent  Procedure(s): RIGHT TOTAL KNEE ARTHROPLASTY ARTHROSCOPY LEFT KNEE.     Patient was given perioperative antibiotics:  Anti-infectives (From admission, onward)   Start     Dose/Rate Route Frequency Ordered Stop   04/09/18 1000  doxycycline (VIBRAMYCIN) 50 MG capsule 50 mg     50 mg Oral Daily 04/08/18 1335     04/08/18 0600  vancomycin (VANCOCIN) IVPB 1000 mg/200 mL premix     1,000 mg 200 mL/hr over 60 Minutes Intravenous To [facility] 04/07/18 1103 04/08/18 0716       Patient was given sequential compression devices, early ambulation, and chemoprophylaxis to prevent DVT.  Patient benefited maximally from hospital stay and there were no complications.    Recent vital signs:  Patient Vitals for the past 24 hrs:  BP Temp Temp src Pulse Resp SpO2  04/09/18 0349 105/68 98.6 F (37 C) Oral 91 18 97 %  04/08/18 2351 112/62 98.6 F (37 C) Oral 84 17 98 %  04/08/18 2139 124/67 98.8 F (37.1 C) Oral 74 18 100 %  04/08/18 1500 122/76 (!) 97 F (36.1 C) Oral 72 18 100 %     Recent laboratory studies:  Recent Labs    04/09/18 0348  WBC 9.4  HGB 11.1*  HCT 34.1*  PLT 184  NA 136  K 4.0  CL 104  CO2 23  BUN 12  CREATININE 0.68  GLUCOSE 110*  CALCIUM 8.6*     Discharge Medications:   Allergies as of 04/09/2018  Reactions   Cefdinir Hives, Itching, Rash   Penicillins Hives   CHILDHOOD REACTION Has patient had a PCN reaction causing immediate rash, facial/tongue/throat swelling, SOB or lightheadedness with hypotension: #  #  NO  #  #  Has patient had a PCN reaction occurring within the last 10 years:#  #  #  YES  #  #  #   If all of the above answers are "NO", then may proceed with Cephalosporin use.   Tape Itching, Rash   Skin turns red Bandaid   Codeine Itching, Nausea Only, Other (See Comments)   Facial flushing   Penicillamine Rash      Medication List    STOP taking these medications   diclofenac sodium 1 % Gel Commonly known as:  VOLTAREN     TAKE these medications   aspirin EC 81 MG tablet Take 1 tablet (81 mg  total) by mouth 2 (two) times daily.   ASTRAGALUS PO Take 1 capsule by mouth 2 (two) times daily.   BLACK COHOSH PO Take 1 capsule by mouth daily.   doxycycline 50 MG capsule Commonly known as:  VIBRAMYCIN Take 50 mg by mouth daily.   fexofenadine-pseudoephedrine 60-120 MG 12 hr tablet Commonly known as:  ALLEGRA-D Take 1 tablet by mouth 2 (two) times daily as needed (allergies or cold symptoms).   fluocinonide gel 0.05 % Commonly known as:  LIDEX APPLY TO AFFECTED AREA twice a day as needed for canker sores.   hyoscyamine 0.125 MG Tbdp disintergrating tablet Commonly known as:  ANASPAZ Place 0.125 mg under the tongue 2 (two) times daily as needed for cramping.   METAMUCIL FIBER PO Take 5 capsules by mouth daily.   multivitamin with minerals Tabs tablet Take 1 tablet by mouth daily.   oxyCODONE-acetaminophen 5-325 MG tablet Commonly known as:  PERCOCET/ROXICET Take 1 tablet by mouth every 4 (four) hours as needed for severe pain.   ranitidine 150 MG tablet Commonly known as:  ZANTAC Take 150 mg by mouth daily.   rosuvastatin 5 MG tablet Commonly known as:  CRESTOR Take 1 tablet (5 mg total) by mouth daily.   SYNTHROID 75 MCG tablet Generic drug:  levothyroxine Take 75 mcg by mouth daily before breakfast.   SYSTANE OP Apply 1 drop to eye every 8 (eight) hours as needed (dry eyes).   tiZANidine 2 MG tablet Commonly known as:  ZANAFLEX Take 1 tablet (2 mg total) by mouth every 6 (six) hours as needed.   triamcinolone cream 0.1 % Commonly known as:  KENALOG Apply 1 application topically 2 (two) times daily as needed (rash).            Durable Medical Equipment  (From admission, onward)        Start     Ordered   04/08/18 1335  DME Walker rolling  Once    Question:  Patient needs a walker to treat with the following condition  Answer:  Status post right knee replacement   04/08/18 1335   04/08/18 1335  DME 3 n 1  Once     04/08/18 1335       Diagnostic Studies: Dg Chest 2 View  Result Date: 04/01/2018 CLINICAL DATA:  Preoperative examination prior to right total knee arthroplasty and left knee arthroscopy. EXAM: CHEST - 2 VIEW COMPARISON:  12/06/2012 and 01/24/2016 FINDINGS: Stable 1.3 cm well-circumscribed nodular density in the right lateral chest. This has minimally changed since 2014 and most compatible with a benign calcified  granuloma. Remainder of the lungs are clear. No airspace disease or pulmonary edema. Heart and mediastinum are within normal limits. Trachea is midline. Mild degenerative changes in the thoracic spine. No pleural effusions. Ovoid shaped calcification in the upper abdomen on the lateral view is indeterminate. This structure could be within bowel but cannot exclude a gallstone. IMPRESSION: No active cardiopulmonary disease. Electronically Signed   By: Markus Daft M.D.   On: 04/01/2018 08:23    Disposition: Discharge disposition: 01-Home or Self Care       Discharge Instructions    Call MD / Call 911   Complete by:  As directed    If you experience chest pain or shortness of breath, CALL 911 and be transported to the hospital emergency room.  If you develope a fever above 101 F, pus (white drainage) or increased drainage or redness at the wound, or calf pain, call your surgeon's office.   Diet general   Complete by:  As directed    Do not put a pillow under the knee. Place it under the heel.   Complete by:  As directed    Increase activity slowly as tolerated   Complete by:  As directed       Follow-up Information    Frederik Pear, MD In 2 weeks.   Specialty:  Orthopedic Surgery Contact information: [place] [address] [phone]        [facility] Follow up.   Specialty:  Home Health Services Why:  Physical therapist will contact you in ned 1-2 days to set up visit.  Contact information: [address] [place]  [address] [phone]             Signed: Erlene Senters 04/09/2018, 1:07 PM

## 2018-04-09 NOTE — Progress Notes (Signed)
Physical Therapy Treatment Patient Details Name: Brooke Williams MRN: [id number] DOB: 24-Feb-1946 Today's Date: 04/09/2018    History of Present Illness Pt is a 72 y/o female s/p R TKA and L knee arthroscopy. PMH including but not limited to L total knee revision 2017, Lupus, HLD.    PT Comments    Pt showing improved mobility and progress towards goals. She ambulated 250 ft with min guard for safety, no buckling noted. Reviewed supine HEP with pt. Plan to perform stair training and seated HEP in PM session, prior to pt d/c this afternoon.     Follow Up Recommendations  Home health PT;Supervision/Assistance - 24 hour     Equipment Recommendations  Rolling walker with 5" wheels    Recommendations for Other Services       Precautions / Restrictions Precautions Precautions: Fall;Knee Precaution Booklet Issued: Yes (comment) Precaution Comments: Reviewed supine HEP Restrictions Weight Bearing Restrictions: Yes RLE Weight Bearing: Weight bearing as tolerated    Mobility  Bed Mobility Overal bed mobility: Needs Assistance Bed Mobility: Supine to Sit     Supine to sit: Supervision     General bed mobility comments: supervision for safety, increased time, use of bed rail  Transfers Overall transfer level: Needs assistance Equipment used: Rolling walker (2 wheeled) Transfers: Sit to/from Stand Sit to Stand: Min guard         General transfer comment: min guard for safety. Good technique with RW.  Ambulation/Gait Ambulation/Gait assistance: Min guard;Min assist Ambulation Distance (Feet): 250 Feet Assistive device: Rolling walker (2 wheeled) Gait Pattern/deviations: Step-to pattern;Step-through pattern;Decreased stride length Gait velocity: decreased   General Gait Details: Overall steady gait and good gait quality with slightly decreased stride length and decreased speed. No buckling noted.  Min guard for safety.   Stairs             Wheelchair  Mobility    Modified Rankin (Stroke Patients Only)       Balance Overall balance assessment: Needs assistance Sitting-balance support: Feet supported Sitting balance-Leahy Scale: Good     Standing balance support: During functional activity;Bilateral upper extremity supported Standing balance-Leahy Scale: Fair Standing balance comment: able to stand w/o UE suppor for hand hygiene                            Cognition Arousal/Alertness: Awake/alert Behavior During Therapy: WFL for tasks assessed/performed Overall Cognitive Status: Within Functional Limits for tasks assessed                                        Exercises Total Joint Exercises Ankle Circles/Pumps: AROM;Both;20 reps;Seated Quad Sets: AROM;Both;10 reps;Supine Towel Squeeze: AROM;Both;10 reps;Supine Short Arc Quad: AROM;Both;10 reps;Supine Heel Slides: AROM;Both;10 reps;Supine Hip ABduction/ADduction: AROM;Both;10 reps;Supine Straight Leg Raises: AROM;Both;10 reps;Supine    General Comments        Pertinent Vitals/Pain Pain Assessment: Faces Faces Pain Scale: Hurts a little bit Pain Location: R knee Pain Descriptors / Indicators: Discomfort Pain Intervention(s): Monitored during session;Repositioned    Home Living                      Prior Function            PT Goals (current goals can now be found in the care plan section) Acute Rehab PT Goals Patient Stated Goal: return home PT Goal Formulation:  With patient/family Time For Goal Achievement: 04/22/18 Potential to Achieve Goals: Good Progress towards PT goals: Progressing toward goals    Frequency    7X/week      PT Plan Current plan remains appropriate    Co-evaluation              AM-PAC PT "6 Clicks" Daily Activity  Outcome Measure  Difficulty turning over in bed (including adjusting bedclothes, sheets and blankets)?: None Difficulty moving from lying on back to sitting on the side  of the bed? : None Difficulty sitting down on and standing up from a chair with arms (e.g., wheelchair, bedside commode, etc,.)?: Unable Help needed moving to and from a bed to chair (including a wheelchair)?: A Little Help needed walking in hospital room?: A Little Help needed climbing 3-5 steps with a railing? : A Little 6 Click Score: 18    End of Session Equipment Utilized During Treatment: Gait belt Activity Tolerance: Patient tolerated treatment well Patient left: in chair;with call bell/phone within reach;with family/visitor present Nurse Communication: Mobility status PT Visit Diagnosis: Other abnormalities of gait and mobility (R26.89)     Time: 9292-4462 PT Time Calculation (min) (ACUTE ONLY): 33 min  Charges:  $Gait Training: 8-22 mins $Therapeutic Exercise: 8-22 mins                    G Codes:       Benjiman Core, [place] Pager [phone] Acute Rehab   Allena Katz 04/09/2018, 10:16 AM

## 2018-04-09 NOTE — Progress Notes (Signed)
Subjective: 1 Day Post-Op Procedure(s) (LRB): RIGHT TOTAL KNEE ARTHROPLASTY (Right) ARTHROSCOPY LEFT KNEE (Left) Patient reports pain as mild.  Taking by mouth and voiding okay.  Wants to go home after therapy today.  Objective: Vital signs in last 24 hours: Temp:  [97 F (36.1 C)-98.8 F (37.1 C)] 98.6 F (37 C) (05/11 0349) Pulse Rate:  [56-91] 91 (05/11 0349) Resp:  [15-22] 18 (05/11 0349) BP: (105-127)/(62-90) 105/68 (05/11 0349) SpO2:  [97 %-100 %] 97 % (05/11 0349)  Intake/Output from previous day: 05/10 0701 - 05/11 0700 In: 1781.7 [P.O.:240; I.V.:1431.7; IV Piggyback:110] Out: 725 [Urine:600; Blood:125] Intake/Output this shift: No intake/output data recorded.  Recent Labs    04/09/18 0348  HGB 11.1*   Recent Labs    04/09/18 0348  WBC 9.4  RBC 3.67*  HCT 34.1*  PLT 184   Recent Labs    04/09/18 0348  NA 136  K 4.0  CL 104  CO2 23  BUN 12  CREATININE 0.68  GLUCOSE 110*  CALCIUM 8.6*   No results for input(s): LABPT, INR in the last 72 hours. Bilateral knee exams: Neurovascular intact Sensation intact distally Intact pulses distally Dorsiflexion/Plantar flexion intact Incision: dressing C/D/I Compartment soft   Patient's anticipated LOS is less than 2 midnights, meeting these requirements:  - Lives within 1 hour of care - Has a competent adult at home to recover with post-op recover - NO history of  - Chronic pain requiring opiods  - Diabetes  - Coronary Artery Disease  - Heart failure  - Heart attack  - Stroke  - DVT/VTE  - Cardiac arrhythmia  - Respiratory Failure/COPD  - Renal failure  - Anemia  - Advanced Liver disease      Assessment/Plan: 1 Day Post-Op Procedure(s) (LRB): RIGHT TOTAL KNEE ARTHROPLASTY (Right) ARTHROSCOPY LEFT KNEE (Left) Plan: Aspirin 81 mg twice daily for DVT prophylaxis x1 month. Weight-bear as tolerated bilaterally. Discharge home with home health Follow-up with Dr. Mayer Camel in 2 weeks.   Brooke Williams 04/09/2018, 8:46 AM

## 2018-04-09 NOTE — Progress Notes (Signed)
Reviewed discharge papers and medications with husband and Mrs Jazmarie with full understanding. IV removed

## 2018-04-09 NOTE — Care Management CC44 (Signed)
Condition Code 44 Documentation Completed  Patient Details  Name: Nyra Anspaugh MRN: [id number] Date of Birth: 02-06-1946   Condition Code 44 given:  Yes Patient signature on Condition Code 44 notice:  Yes Documentation of 2 MD's agreement:  Yes Code 44 added to claim:  Yes    Claudie Leach, RN 04/09/2018, 12:18 PM

## 2018-04-09 NOTE — Care Management Note (Signed)
Case Management Note  Patient Details  Name: Brooke Williams MRN: [id number] Date of Birth: 07/02/1946  Subjective/Objective:     Pt from home with husband for right total knee and left knee arthoplasty.  Pt previously set up with [facility] for [facility] PT.  Pt has 3n1 but needs new walker.                Action/Plan: RW ordered from [facility] to be delivered to room.    Tiffany with [facility] notified of patient d/c.   Expected Discharge Date:  04/09/18               Expected Discharge Plan:  [place]  In-House Referral:  NA  Discharge planning Services  CM Consult  Post Acute Care Choice:  Durable Medical Equipment, Home Health Choice offered to:  Patient  DME Arranged:  N/A DME Agency:  [place]:  PT [place] Agency:  [facility]  Status of Service:  Completed, signed off  If discussed at [employer], dates discussed:    Additional Comments:  Claudie Leach, RN 04/09/2018, 11:29 AM

## 2018-04-09 NOTE — Progress Notes (Signed)
On arrival to room fully dressed and packing to prepare for d/c this PM. Completed seated HEP training and stair training. Pt ambulated with supervision with RW and can negotiate steps with min guard for safety. She would benefit from HHPT after d/c home to maximize functional independence and safety with mobility.     04/09/18 1300  PT Visit Information  Last PT Received On 04/09/18  Assistance Needed +1  History of Present Illness Pt is a 72 y/o female s/p R TKA and L knee arthroscopy. PMH including but not limited to L total knee revision 2017, Lupus, HLD.  Subjective Data  Patient Stated Goal return home  Precautions  Precautions Fall;Knee  Precaution Booklet Issued Yes (comment)  Precaution Comments Reviewed supine HEP  Restrictions  Weight Bearing Restrictions Yes  RLE Weight Bearing WBAT  Pain Assessment  Pain Assessment Faces  Faces Pain Scale 2  Pain Location R knee  Pain Descriptors / Indicators Discomfort  Pain Intervention(s) Monitored during session  Cognition  Arousal/Alertness Awake/alert  Behavior During Therapy WFL for tasks assessed/performed  Overall Cognitive Status Within Functional Limits for tasks assessed  Bed Mobility  General bed mobility comments standing in room on arrival  Transfers  Overall transfer level Needs assistance  Equipment used Rolling walker (2 wheeled)  Transfers Sit to/from Stand  Sit to Stand Supervision  Ambulation/Gait  Ambulation/Gait assistance Supervision  Ambulation Distance (Feet) 200 Feet  Assistive device Rolling walker (2 wheeled)  Gait Pattern/deviations Step-through pattern;Decreased stride length  General Gait Details Overall steady gait and mildly antalgic this PM secondary to pain. Decreased speed.  Gait velocity decreased  Stairs Yes  Stairs assistance Min guard  Stair Management One rail Left;Step to pattern;Forwards  Number of Stairs 4  General stair comments Pt able to negotiate steps with single hand rail and  min guard for safety.  Balance  Overall balance assessment Needs assistance  Sitting-balance support Feet supported  Sitting balance-Leahy Scale Good  Standing balance support During functional activity;Bilateral upper extremity supported  Standing balance-Leahy Scale Fair  Standing balance comment able to stand w/o UE suppor for hand hygiene  Exercises  Exercises Total Joint  Total Joint Exercises  Long Arc Quad AROM;Both;10 reps;Seated  Heel Slides AROM;Both;10 reps;Seated (towel under foot)  Knee Flexion AROM;Both;5 reps;Seated (10 sec holds)  PT - End of Session  Equipment Utilized During Treatment Gait belt  Activity Tolerance Patient tolerated treatment well  Patient left in chair;with call bell/phone within reach;with family/visitor present  Nurse Communication Mobility status   PT - Assessment/Plan  PT Plan Current plan remains appropriate  PT Visit Diagnosis Other abnormalities of gait and mobility (R26.89)  PT Frequency (ACUTE ONLY) 7X/week  Follow Up Recommendations Home health PT;Supervision/Assistance - 24 hour  PT equipment Rolling walker with 5" wheels  AM-PAC PT "6 Clicks" Daily Activity Outcome Measure  Difficulty turning over in bed (including adjusting bedclothes, sheets and blankets)? 4  Difficulty moving from lying on back to sitting on the side of the bed?  4  Difficulty sitting down on and standing up from a chair with arms (e.g., wheelchair, bedside commode, etc,.)? 1  Help needed moving to and from a bed to chair (including a wheelchair)? 4  Help needed walking in hospital room? 4  Help needed climbing 3-5 steps with a railing?  3  6 Click Score 20  Mobility G Code  CJ  PT Goal Progression  Progress towards PT goals Progressing toward goals  Acute Rehab PT Goals  PT Goal Formulation With patient/family  Time For Goal Achievement 04/22/18  Potential to Achieve Goals Good  PT Time Calculation  PT Start Time (ACUTE ONLY) 1132  PT Stop Time (ACUTE  ONLY) 1157  PT Time Calculation (min) (ACUTE ONLY) 25 min  PT General Charges  $$ ACUTE PT VISIT 1 Visit  PT Treatments  $Gait Training 8-22 mins  $Therapeutic Exercise 8-22 mins   Benjiman Core, [place] Pager [phone] Acute Rehab

## 2018-04-11 ENCOUNTER — Encounter (HOSPITAL_COMMUNITY): Payer: Self-pay | Admitting: Orthopedic Surgery

## 2018-04-11 NOTE — Addendum Note (Signed)
Addended by: Patria Mane A on: 04/11/2018 03:03 PM   Modules accepted: Orders

## 2018-06-06 ENCOUNTER — Other Ambulatory Visit: Payer: Self-pay | Admitting: Family Medicine

## 2018-06-06 DIAGNOSIS — M8580 Other specified disorders of bone density and structure, unspecified site: Secondary | ICD-10-CM

## 2018-06-06 DIAGNOSIS — Z1231 Encounter for screening mammogram for malignant neoplasm of breast: Secondary | ICD-10-CM

## 2018-07-19 ENCOUNTER — Other Ambulatory Visit: Payer: Medicare Other

## 2018-07-19 ENCOUNTER — Ambulatory Visit: Payer: Medicare Other

## 2018-08-03 ENCOUNTER — Ambulatory Visit
Admission: RE | Admit: 2018-08-03 | Discharge: 2018-08-03 | Disposition: A | Discharge status: Home / Self Care | Payer: Medicare Other | Source: Ambulatory Visit | Attending: Family Medicine | Admitting: Family Medicine

## 2018-08-03 DIAGNOSIS — Z1231 Encounter for screening mammogram for malignant neoplasm of breast: Secondary | ICD-10-CM

## 2018-08-03 DIAGNOSIS — M8580 Other specified disorders of bone density and structure, unspecified site: Secondary | ICD-10-CM

## 2018-08-22 LAB — COMPREHENSIVE METABOLIC PANEL
ALT: 30 IU/L (ref 0–32)
AST: 23 IU/L (ref 0–40)
Albumin/Globulin Ratio: 2.2 (ref 1.2–2.2)
Albumin: 4.4 g/dL (ref 3.5–4.8)
Alkaline Phosphatase: 97 IU/L (ref 39–117)
BUN/Creatinine Ratio: 19 (ref 12–28)
BUN: 15 mg/dL (ref 8–27)
Bilirubin Total: 0.4 mg/dL (ref 0.0–1.2)
CO2: 24 mmol/L (ref 20–29)
Calcium: 10 mg/dL (ref 8.7–10.3)
Chloride: 105 mmol/L (ref 96–106)
Creatinine, Ser: 0.77 mg/dL (ref 0.57–1.00)
GFR calc Af Amer: 90 mL/min/{1.73_m2} (ref >59)
GFR calc non Af Amer: 78 mL/min/{1.73_m2} (ref >59)
Globulin, Total: 2 g/dL (ref 1.5–4.5)
Glucose: 89 mg/dL (ref 65–99)
Potassium: 4.8 mmol/L (ref 3.5–5.2)
Sodium: 143 mmol/L (ref 134–144)
Total Protein: 6.4 g/dL (ref 6.0–8.5)

## 2018-08-22 LAB — LIPID PANEL
Chol/HDL Ratio: 3.1 ratio (ref 0.0–4.4)
Cholesterol, Total: 175 mg/dL (ref 100–199)
HDL: 56 mg/dL (ref >39)
LDL Calculated: 89 mg/dL (ref 0–99)
Triglycerides: 150 mg/dL — ABNORMAL HIGH (ref 0–149)
VLDL Cholesterol Cal: 30 mg/dL (ref 5–40)

## 2018-08-24 ENCOUNTER — Encounter: Payer: Self-pay | Admitting: Cardiovascular Disease

## 2018-08-24 ENCOUNTER — Ambulatory Visit (INDEPENDENT_AMBULATORY_CARE_PROVIDER_SITE_OTHER): Payer: Medicare Other | Admitting: Cardiovascular Disease

## 2018-08-24 VITALS — BP 119/81 | HR 91 | Ht 66.0 in | Wt 165.2 lb

## 2018-08-24 DIAGNOSIS — Z8249 Family history of ischemic heart disease and other diseases of the circulatory system: Secondary | ICD-10-CM | POA: Diagnosis not present

## 2018-08-24 DIAGNOSIS — E785 Hyperlipidemia, unspecified: Secondary | ICD-10-CM

## 2018-08-24 DIAGNOSIS — IMO0002 Reserved for concepts with insufficient information to code with codable children: Secondary | ICD-10-CM

## 2018-08-24 DIAGNOSIS — M329 Systemic lupus erythematosus, unspecified: Secondary | ICD-10-CM

## 2018-08-24 DIAGNOSIS — Z79899 Other long term (current) drug therapy: Secondary | ICD-10-CM

## 2018-08-24 MED ORDER — ROSUVASTATIN CALCIUM 5 MG PO TABS: 5.0000 mg | ORAL_TABLET | Freq: Every day | ORAL | 3 refills | Status: DC

## 2018-08-24 NOTE — Patient Instructions (Signed)
Medication Instructions:  Your physician recommends that you continue on your current medications as directed. Please refer to the Current Medication list given to you today.  Labwork: Please return for FASTING labs in 3 months (CMET, Lipid)  Our in office lab hours are Monday-Friday 8:00-4:00, closed for lunch 12:45-1:45 pm.  No appointment needed.  Follow-Up: 3-4 months with Dr. Claiborne Billings  Any Other Special Instructions Will Be Listed Below (If Applicable).     If you need a refill on your cardiac medications before your next appointment, please call your pharmacy.

## 2018-08-24 NOTE — Progress Notes (Signed)
Patient ID: Brooke Williams, female   DOB: May 14, 1946, 72 y.o.   MRN: [id number]     HPI: Brooke Williams is a 72 y.o. female who presents for a 54-monthfollow-up cardiology evaluation.   Brooke Williams a history of lupus erythematosus, hyperlipidemia, and strong family history for premature coronary disease. 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 In the past I tried initiating therapy with Simcor 500/20 due to her lipid parameters but she was unable to tolerate this. Subsequently, we tried low-dose Crestor but again she stopped it secondary to development of vague myalgias.  She was diagnosed with endometrial cancer and underwent complete hysterectomy in January 2014.  Initially she did not require chemotherapy or radiation.  However, since I last saw her , she develop recurrence in 2015  and required surgery in February 2015.  She received paclitaxel and carboplatin chemotherapy.  She also has had several procedures for removal of lipoma from her lower back.  She [facility] use [facility] for recurrent cancer.  When I  saw her in December 2016 her sister had recently undergone placement of 2 coronary stents and her mother's twin sister had just undergone CABG surgery. She had undergone previous left knee replacement several years ago.  [facility] results of her CT scans where she was found to have a stable right lower lobe 1.1 cm nodule.  There was no evidence for recurrent or metastatic disease in the abdomen or pelvis  I last saw her in May 2019 for preoperative clearance prior to undergoing right knee replacement with Dr. FFrederik Pear A  preoperative clearance  ECG showed small Q waves in 3 and nondiagnostic diminutive Q wave in aVF.  Because of her ECG she was referred to me today prior to undergoing surgery the following day.  I felt she was stable from a cardiac standpoint.  She underwent surgery successfully without cardiovascular compromise.  During that evaluation I noticed that she had had laboratory from her primary physician in November 2018 and her cholesterol was elevated at 226 with an LDL of 148.  She apparently was given a prescription for Crestor 5 mg but she never started this.  She developed the flu in January and then required surgery in February for rectal prolapse.    When I saw her in May 2019 I recommended initiation of Crestor.  And started her at 5 mg with plans to titrate to 10 as tolerated.  If she was unable to tolerate Crestor then Zetia was to be added and potentially she could be a candidate for PCSK9 inhibition.  She has tolerated Crestor 5 mg but never titrated this to 10 mg.  Most recent lipid studies this past week has shown marked improvement in LDL to 89.  However, she states that she stopped taking Crestor around the time of her surgery but several weeks ago reinstituted this and as result she believes her levels may actually be further improved.  She was recently seen by her primary physician and she is to start Fosamax.  She denies chest pain.  She denies palpitations.  She presents for reevaluation.  Past Medical History:  Diagnosis  Date  . Arthritis    arthritis-hands. cervical disc degeneration  . Back pain    6th vertebrae has collapse on 7th,pinched nerve feeling occ  . Dizziness    rarely,takes Meclizine if needed  . Endometrial cancer ([place]) 2014  . GERD (gastroesophageal reflux disease)    controls with Zantac  . Headache(784.0)    headaches frequently but states since placed on 2L/m via N/C headaches have improved  . History of colon polyps    benign  . History of staph infection   .  Hyperlipidemia    takes krill oil daily  . Hypothyroidism    takes Synthroid daily  . Ileus ([place]) 10/2015   was treated  . Joint pain   . Joint swelling   . Lupus ([place])    remission since 2008  . Nocturia   . OSA (obstructive sleep apnea)    sleep study in epic from 11-01-15.Uses Oxygen  . Pneumonia    hx of-within the last 15 yrs  . PONV (postoperative nausea and vomiting)   . Restless leg syndrome    takes Mirapex nightly  . Seasonal allergies    takes Allegra daily and uses Astelin Nasal Spray as needed    Past Surgical History:  Procedure Laterality Date  . ABDOMINAL HYSTERECTOMY    . BRAIN SURGERY     left cataract  . COLON SURGERY     12/2017  . COLONOSCOPY    . DILATION AND CURETTAGE OF UTERUS     2008-polyp removed  . HAND SURGERY  2006   left thumb surgery with pin  . KNEE ARTHROPLASTY    . KNEE ARTHROSCOPY Left 04/08/2018   Procedure: ARTHROSCOPY LEFT KNEE;  Surgeon: Frederik Pear, MD;  Location: [place];  Service: Orthopedics;  Laterality: Left;  . lipomas removed from back   40 yrs ago  . MASS EXCISION N/A 10/05/2014   Procedure: EXCISION OF BILATERAL LOWER BACK MASSES;  Surgeon: Coralie Keens, MD;  Location: [place];  Service: General;  Laterality: N/A;  . MEDIAL PARTIAL KNEE REPLACEMENT  2005   LT knee  . OTHER SURGICAL HISTORY     metal pin/screw to repair arthritis in LT thumb joint  . ROBOTIC ASSISTED TOTAL HYSTERECTOMY WITH BILATERAL SALPINGO OOPHERECTOMY  12/20/2012   Procedure: ROBOTIC ASSISTED TOTAL HYSTERECTOMY WITH BILATERAL SALPINGO OOPHORECTOMY;  Surgeon: Imagene Gurney A. Alycia Rossetti, MD;  Location: [facility];  Service: Gynecology;  Laterality: N/A;  WITH PELVIC PARAAORTIC   . ROBOTIC PELVIC AND PARA-AORTIC LYMPH NODE DISSECTION  12/20/2012   Procedure: ROBOTIC PELVIC AND PARA-AORTIC LYMPH NODE DISSECTION;  Surgeon: Imagene Gurney A. Alycia Rossetti, MD;  Location: [facility];  Service: Gynecology;  Laterality: Bilateral;  . TOTAL KNEE ARTHROPLASTY Right 04/08/2018    Procedure: RIGHT TOTAL KNEE ARTHROPLASTY;  Surgeon: Frederik Pear, MD;  Location: [place];  Service: Orthopedics;  Laterality: Right;  Right TKA and Left Knee Arthroscopy  . TOTAL KNEE REVISION Left 02/03/2016   Procedure: TOTAL KNEE REVISION;  Surgeon: Frederik Pear, MD;  Location: [place];  Service: Orthopedics;  Laterality: Left;    Allergies  Allergen Reactions  . Cefdinir Hives, Itching and Rash  . Penicillins Hives    CHILDHOOD REACTION Has patient had a PCN reaction causing immediate rash, facial/tongue/throat swelling, SOB or lightheadedness with hypotension: #  #  NO  #  #  Has patient had a PCN reaction occurring within the last 10 years:#  #  #  YES  #  #  #  If all of the above answers are "NO", then may proceed with Cephalosporin use.  . Tape Itching and Rash    Skin turns red Bandaid  . Codeine Itching, Nausea Only and Other (See Comments)    Facial flushing   . Penicillamine Rash    Current Outpatient Medications  Medication Sig Dispense Refill  . ASTRAGALUS PO Take 1 capsule by mouth 2 (two) times daily.     Marland Kitchen BLACK COHOSH PO Take 1 capsule by mouth daily.     Marland Kitchen doxycycline (VIBRAMYCIN) 50 MG capsule Take 50 mg by mouth daily.  3  . fluocinonide gel (LIDEX) 0.05 % APPLY TO AFFECTED AREA twice a day as needed for canker sores.  1  . hyoscyamine (ANASPAZ) 0.125 MG TBDP disintergrating tablet Place 0.125 mg under the tongue 2 (two) times daily as needed for cramping.    . Multiple Vitamin (MULTIVITAMIN WITH MINERALS) TABS Take 1 tablet by mouth daily.    Vladimir Faster Glycol-Propyl Glycol (SYSTANE OP) Apply 1 drop to eye every 8 (eight) hours as needed (dry eyes).    . Psyllium (METAMUCIL FIBER PO) Take 5 capsules by mouth daily.     Marland Kitchen SYNTHROID 75 MCG tablet Take 75 mcg by mouth daily before breakfast.     . triamcinolone cream (KENALOG) 0.1 % Apply 1 application topically 2 (two) times daily as needed (rash).     . rosuvastatin (CRESTOR) 5 MG tablet Take 1 tablet (5 mg total)  by mouth daily. 90 tablet 3   No current facility-administered medications for this visit.     Social History   Socioeconomic History  . Marital status: Married    Spouse name: Not on file  . Number of children: 0  . Years of education: Not on file  . Highest education level: Not on file  Occupational History  . Not on file  Social Needs  . Financial resource strain: Not on file  . Food insecurity:    Worry: Not on file    Inability: Not on file  . Transportation needs:    Medical: Not on file    Non-medical: Not on file  Tobacco Use  . Smoking status: Never Smoker  . Smokeless tobacco: Never Used  Substance and Sexual Activity  . Alcohol use: Yes    Comment: occassional  . Drug use: No  . Sexual activity: Not Currently    Birth control/protection: Post-menopausal  Lifestyle  . Physical activity:    Days per week: Not on file    Minutes per session: Not on file  . Stress: Not on file  Relationships  . Social connections:    Talks on phone: Not on file    Gets together: Not on file    Attends religious service: Not on file    Active member of club or organization: Not on file    Attends meetings of clubs or organizations: Not on file    Relationship status: Not on file  . Intimate partner violence:    Fear of current or ex partner: Not on file    Emotionally abused: Not on file    Physically abused: Not on file    Forced sexual activity: Not on file  Other Topics Concern  . Not on file  Social History Narrative   Patient does not have any biological children but has step children    Family History  Problem Relation Age of Onset  . Stroke Mother 6  arterial wall of artery in eye   . Endometrial cancer Mother 27  . Dementia Father   . Stroke Father        "lots of many strokes"  . Aneurysm Father 33       AAA  . Multiple births Maternal Grandmother        died 5 days after delivery of unknown causes  . Colon cancer Maternal Aunt        late 40s    . Cancer Cousin        maternal cousin with an unknown form of cancer  . Breast cancer Neg Hx    Additional social history is that I take care of her husband Mr. Arsenia Goracke. There is no history of ever smoking tobacco. Occasional alcohol use.   ROS General: Negative; No fevers, chills, or night sweats;  HEENT: Negative; No changes in vision or hearing, sinus congestion, difficulty swallowing Pulmonary: Negative; No cough, wheezing, shortness of breath, hemoptysis Cardiovascular: Negative; No chest pain, presyncope, syncope, palpitations GI: Negative; No nausea, vomiting, diarrhea, or abdominal pain GU: Negative; No dysuria, hematuria, or difficulty voiding Musculoskeletal: This post successful knee surgery Hematologic/Oncology: Positive for endometrial cancer Endocrine: Negative; no heat/cold intolerance; no diabetes Neuro: Negative; no changes in balance, headaches Skin: Negative; No rashes or skin lesions Psychiatric: Negative; No behavioral problems, depression Sleep: Negative; No snoring, daytime sleepiness, hypersomnolence, bruxism, restless legs, hypnogognic hallucinations, no cataplexy Other comprehensive 14 point system review is negative.  PE BP 119/81   Pulse 91   Ht 5' 6" (1.676 m)   Wt 165 lb 3.2 oz (74.9 kg)   BMI 26.66 kg/m    Repeat blood pressure by me 118/76  Wt Readings from Last 3 Encounters:  08/24/18 165 lb 3.2 oz (74.9 kg)  04/08/18 162 lb (73.5 kg)  04/07/18 162 lb 9.6 oz (73.8 kg)   General: Alert, oriented, no distress.  Skin: normal turgor, no rashes, warm and dry HEENT: Normocephalic, atraumatic. Pupils equal round and reactive to light; sclera anicteric; extraocular muscles intact;  Nose without nasal septal hypertrophy Mouth/Parynx benign; Mallinpatti scale 2 Neck: No JVD, no carotid bruits; normal carotid upstroke Lungs: clear to ausculatation and percussion; no wheezing or rales Chest wall: without tenderness to palpitation Heart: PMI  not displaced, RRR, s1 s2 normal, 1/6 systolic murmur, no diastolic murmur, no rubs, gallops, thrills, or heaves Abdomen: soft, nontender; no hepatosplenomehaly, BS+; abdominal aorta nontender and not dilated by palpation. Back: no CVA tenderness Pulses 2+ Musculoskeletal: full range of motion, normal strength, no joint deformities Extremities: no clubbing cyanosis or edema, Homan's sign negative  Neurologic: grossly nonfocal; Cranial nerves grossly wnl Psychologic: Normal mood and affect   ECG (independently read by me): Normal sinus rhythm at 91 bpm.  Normal intervals.  No ectopy.  May 2019 ECG (independently read by me): Normal sinus rhythm at 84 bpm.  Possible borderline left atrial enlargement.  No ectopy. Normal intervals.  No evidence for prior MI.  Q waves in lead III.  Normal R waves in lead II and aVF  September 2016 ECG (independently read by me): Normal sinus rhythm at 74 bpm.  No ectopy.  Normal intervals.  ECG: Sinus rhythm at 69 beats per minute. No ectopy. Normal intervals.  LABS: BMP Latest Ref Rng & Units 08/22/2018 04/09/2018 03/31/2018  Glucose 65 - 99 mg/dL 89 110(H) 95  BUN 8 - 27 mg/dL 15 12 24(H)  Creatinine 0.57 - 1.00 mg/dL 0.77 0.68 0.74  BUN/Creat Ratio  12 - 28 19 - -  Sodium 134 - 144 mmol/L 143 136 141  Potassium 3.5 - 5.2 mmol/L 4.8 4.0 3.9  Chloride 96 - 106 mmol/L 105 104 106  CO2 20 - 29 mmol/L _0 Calcium 8.7 - 10.3 mg/dL 10.0 8.6(L) 9.8   Hepatic Function Latest Ref Rng & Units 08/22/2018 07/29/2017 11/03/2015  Total Protein 6.0 - 8.5 g/dL 6.4 7.1 7.4  Albumin 3.5 - 4.8 g/dL 4.4 4.2 4.6  AST 0 - 40 IU/L 23 52(H) 34  ALT 0 - 32 IU/L 30 61(H) 39  Alk Phosphatase 39 - 117 IU/L 97 100 96  Total Bilirubin 0.0 - 1.2 mg/dL 0.4 0.6 0.8   CBC Latest Ref Rng & Units 04/09/2018 03/31/2018 07/29/2017  WBC 4.0 - 10.5 K/uL 9.4 7.8 7.0  Hemoglobin 12.0 - 15.0 g/dL 11.1(L) 14.2 13.7  Hematocrit 36.0 - 46.0 % 34.1(L) 43.2 39.9  Platelets 150 - 400 K/uL 184 257  229   Lab Results  Component Value Date   MCV 92.9 04/09/2018   MCV 92.9 03/31/2018   MCV 91.5 07/29/2017   Lab Results  Component Value Date   TSH 3.618 09/06/2015    No results found for: HGBA1C   Lipid Panel     Component Value Date/Time   CHOL 175 08/22/2018 0840   TRIG 150 (H) 08/22/2018 0840   HDL 56 08/22/2018 0840   CHOLHDL 3.1 08/22/2018 0840   CHOLHDL 3.7 09/06/2015 0914   VLDL 32 (H) 09/06/2015 0914   LDLCALC 89 08/22/2018 0840     RADIOLOGY: No results found.  Prior  2016 NMR lipoprotein: significant increase LDL particle #2251, LDL calculated 152, HDL 54, triglycerides 180, total cholesterol 242, small LDL particle #1427, and insulin resistance coronary increased at 49.   IMPRESSION:  1. Hyperlipidemia, unspecified hyperlipidemia type   2. Medication management   3. Family history of premature CAD   4. History of lupus ([place])     ASSESSMENT AND PLAN: Brooke Williams is a 72 year old female who has a strong family history for premature coronary artery disease and when I last saw her in 2017 several family members had undergone cardiovascular interventions.  She has a history of hyperlipidemia and remotely had tried statins.  Since I last saw her, I again recommended a challenge of low-dose Crestor 5 mg.  She has been able to tolerate this did not start this until after she felt healed from her knee surgery.  As result she is only been on treatment for 3 weeks but her LDL has significantly improved.  I have recommended follow-up laboratory be checked in several months.  If she is not at target less than 70 further titration will be recommended.  Her most recent LDL is improved from 148 down to 89.  Her blood pressure today is stable.  She tolerated knee surgery without cardiovascular compromise.  She is on Synthroid for hypothyroidism.  She denies any palpitations.  Her resting heart rate today was upper normal at 90.  Now that her knee is feeling better  improved aerobic capacity was recommended.  Laboratory will be obtained 3 months and I will see her in 4 months for reevaluation.  Time spent: 25 minutes  Troy Sine, MD, [facility]  08/26/2018 8:07 PM

## 2018-08-26 ENCOUNTER — Encounter: Payer: Self-pay | Admitting: Cardiovascular Disease

## 2018-09-14 NOTE — Progress Notes (Signed)
Office Visit Note  Patient: Brooke Williams             Date of Birth: [birth year]           MRN: [id number]             PCP: Stephens Shire, MD Referring: Stephens Shire, MD Visit Date: 09/28/2018 Occupation: @GUAROCC @  Subjective:  Joint stiffness.  History of Present Illness: Brooke Williams is a 72 y.o. female with history of osteoarthritis.  She continues to have increasing stiffness and pain in her bilateral hands and feet.  She had right total knee replacement in May 2019 by Dr. Glory Rosebush which is doing well.  She also continues to have some discomfort in her neck where underlying disc disease.  She has right trochanteric bursitis.  She has nocturnal pain in the right trochanteric bursa.  She states she had x-ray done by Dr. Chriss Czar of her right hip which was unremarkable.  Activities of Daily Living:  Patient reports morning stiffness forall day  hours.   Patient Reports nocturnal pain.  Difficulty dressing/grooming: Denies Difficulty climbing stairs: Denies Difficulty getting out of chair: Denies Difficulty using hands for taps, buttons, cutlery, and/or writing: Denies  Review of Systems  Constitutional: Positive for fatigue. Negative for night sweats, weight gain and weight loss.  HENT: Positive for mouth dryness. Negative for mouth sores, trouble swallowing, trouble swallowing and nose dryness.   Eyes: Positive for dryness. Negative for pain, redness and visual disturbance.  Respiratory: Negative for cough, hemoptysis, shortness of breath and difficulty breathing.   Cardiovascular: Negative for chest pain, palpitations, hypertension, irregular heartbeat and swelling in legs/feet.  Gastrointestinal: Negative for blood in stool, constipation and diarrhea.  Endocrine: Negative for increased urination.  Genitourinary: Negative for painful urination and vaginal dryness.  Musculoskeletal: Positive for arthralgias, joint pain and morning stiffness. Negative for joint swelling,  myalgias, muscle weakness, muscle tenderness and myalgias.  Skin: Negative for color change, pallor, rash, hair loss, nodules/bumps, skin tightness, ulcers and sensitivity to sunlight.  Allergic/Immunologic: Negative for susceptible to infections.  Neurological: Negative for dizziness, numbness, headaches, memory loss, night sweats and weakness.  Hematological: Negative for swollen glands.  Psychiatric/Behavioral: Positive for sleep disturbance. Negative for depressed mood. The patient is not nervous/anxious.     PMFS History:  Patient Active Problem List   Diagnosis Date Noted  . Status post total bilateral knee replacement 09/28/2018  . Primary osteoarthritis of both feet 09/28/2018  . Knee osteoarthritis 04/09/2018  . Fibrosis of left knee joint 04/09/2018  . Primary osteoarthritis of right knee 04/08/2018  . Osteoarthritis of right knee 04/06/2018  . Primary osteoarthritis of both knees 09/24/2016  . Primary osteoarthritis of both hands 09/24/2016  . DJD (degenerative joint disease), cervical 09/24/2016  . Osteopenia of multiple sites 09/24/2016  . Vitamin D deficiency 09/24/2016  . Positive ANA (antinuclear antibody) 09/24/2016  . Recurrent aphthous stomatitis 02/04/2016  . Benign neoplasm of skin of trunk 02/04/2016  . Loose total knee arthroplasty ([place]) Left 02/01/2016  . Chronic respiratory failure with hypoxia (HCC)/ noct hypoxemia 11/21/2015  . OSA (obstructive sleep apnea) 10/16/2015  . Chronic tension-type headache, not intractable 10/16/2015  . Atypical chest pain 08/28/2015  . Vasomotor flushing 07/05/2014  . Adult hypothyroidism 02/05/2014  . Hyperlipidemia 10/10/2013  . Endometrial ca ([place]) 10/10/2013  . Family history of premature CAD 10/10/2013  . Endometrial adenocarcinoma ([place]) 12/01/2012  . HEMORRHOIDS, INTERNAL 09/11/2010  . Migraine headache 03/19/2010  . ESOPHAGEAL REFLUX 03/19/2010  .  FULL INCONTINENCE OF FECES 03/19/2010    Past Medical History:    Diagnosis Date  . Arthritis    arthritis-hands. cervical disc degeneration  . Back pain    6th vertebrae has collapse on 7th,pinched nerve feeling occ  . Dizziness    rarely,takes Meclizine if needed  . Endometrial cancer ([place]) 2014  . GERD (gastroesophageal reflux disease)    controls with Zantac  . Headache(784.0)    headaches frequently but states since placed on 2L/m via N/C headaches have improved  . History of colon polyps    benign  . History of staph infection   . Hyperlipidemia    takes krill oil daily  . Hypothyroidism    takes Synthroid daily  . Ileus ([place]) 10/2015   was treated  . Joint pain   . Joint swelling   . Lupus ([place])    remission since 2008  . Nocturia   . OSA (obstructive sleep apnea)    sleep study in epic from 11-01-15.Uses Oxygen  . Pneumonia    hx of-within the last 15 yrs  . PONV (postoperative nausea and vomiting)   . Restless leg syndrome    takes Mirapex nightly  . Seasonal allergies    takes Allegra daily and uses Astelin Nasal Spray as needed    Family History  Problem Relation Age of Onset  . Stroke Mother 47       arterial wall of artery in eye   . Endometrial cancer Mother 62  . Dementia Father   . Stroke Father        "lots of many strokes"  . Aneurysm Father 81       AAA  . Multiple births Maternal Grandmother        died 5 days after delivery of unknown causes  . Colon cancer Maternal Aunt        late 61s  . Cancer Cousin        maternal cousin with an unknown form of cancer  . Breast cancer Neg Hx    Past Surgical History:  Procedure Laterality Date  . ABDOMINAL HYSTERECTOMY    . BRAIN SURGERY     left cataract  . COLON SURGERY     12/2017  . COLONOSCOPY    . DILATION AND CURETTAGE OF UTERUS     2008-polyp removed  . HAND SURGERY  2006   left thumb surgery with pin  . KNEE ARTHROPLASTY    . KNEE ARTHROSCOPY Left 04/08/2018   Procedure: ARTHROSCOPY LEFT KNEE;  Surgeon: Frederik Pear, MD;  Location: [facility];   Service: Orthopedics;  Laterality: Left;  . lipomas removed from back   40 yrs ago  . MASS EXCISION N/A 10/05/2014   Procedure: EXCISION OF BILATERAL LOWER BACK MASSES;  Surgeon: Coralie Keens, MD;  Location: [facility];  Service: General;  Laterality: N/A;  . MEDIAL PARTIAL KNEE REPLACEMENT  2005   LT knee  . OTHER SURGICAL HISTORY     metal pin/screw to repair arthritis in LT thumb joint  . ROBOTIC ASSISTED TOTAL HYSTERECTOMY WITH BILATERAL SALPINGO OOPHERECTOMY  12/20/2012   Procedure: ROBOTIC ASSISTED TOTAL HYSTERECTOMY WITH BILATERAL SALPINGO OOPHORECTOMY;  Surgeon: Imagene Gurney A. Alycia Rossetti, MD;  Location: [facility];  Service: Gynecology;  Laterality: N/A;  WITH PELVIC PARAAORTIC   . ROBOTIC PELVIC AND PARA-AORTIC LYMPH NODE DISSECTION  12/20/2012   Procedure: ROBOTIC PELVIC AND PARA-AORTIC LYMPH NODE DISSECTION;  Surgeon: Imagene Gurney A. Alycia Rossetti, MD;  Location: [facility];  Service:  Gynecology;  Laterality: Bilateral;  . TOTAL KNEE ARTHROPLASTY Right 04/08/2018   Procedure: RIGHT TOTAL KNEE ARTHROPLASTY;  Surgeon: Frederik Pear, MD;  Location: [facility];  Service: Orthopedics;  Laterality: Right;  Right TKA and Left Knee Arthroscopy  . TOTAL KNEE REVISION Left 02/03/2016   Procedure: TOTAL KNEE REVISION;  Surgeon: Frederik Pear, MD;  Location: [facility];  Service: Orthopedics;  Laterality: Left;   Social History   Social History Narrative   Patient does not have any biological children but has step children    Objective: Vital Signs: BP 118/68 (BP Location: Left Arm, Patient Position: Sitting, Cuff Size: Normal)   Pulse 85   Resp 13   Ht 5\' 7"  (1.702 m)   Wt 168 lb 6.4 oz (76.4 kg)   BMI 26.38 kg/m    Physical Exam  Constitutional: She is oriented to person, place, and time. She appears well-developed and well-nourished.  HENT:  Head: Normocephalic and atraumatic.  Eyes: Conjunctivae and EOM are normal.  Neck: Normal range of motion.  Cardiovascular: Normal rate, regular rhythm, normal heart  sounds and intact distal pulses.  Pulmonary/Chest: Effort normal and breath sounds normal.  Abdominal: Soft. Bowel sounds are normal.  Lymphadenopathy:    She has no cervical adenopathy.  Neurological: She is alert and oriented to person, place, and time.  Skin: Skin is warm and dry. Capillary refill takes less than 2 seconds.  Psychiatric: She has a normal mood and affect. Her behavior is normal.  Nursing note and vitals reviewed.    Musculoskeletal Exam: She has limited range of motion of her cervical spine.  Thoracic and lumbar spine were in good range of motion.  She has good range of motion of bilateral shoulders and elbow joints.  She has DIP PIP and CMC thickening in her bilateral hands consistent with osteoarthritis.  No synovitis was noted.  She had tenderness over right trochanteric bursa consistent with trochanteric bursitis.  She has bilateral total knee replacement.  Right knee joint was warm to touch. CDAI Exam: CDAI Score: Not documented Patient Global Assessment: Not documented; Provider Global Assessment: Not documented Swollen: Not documented; Tender: Not documented Joint Exam   Not documented   There is currently no information documented on the homunculus. Go to the Rheumatology activity and complete the homunculus joint exam.  Investigation: No additional findings.  Imaging: No results found.  Recent Labs: Lab Results  Component Value Date   WBC 9.4 04/09/2018   HGB 11.1 (L) 04/09/2018   PLT 184 04/09/2018   NA 143 08/22/2018   K 4.8 08/22/2018   CL 105 08/22/2018   CO2 24 08/22/2018   GLUCOSE 89 08/22/2018   BUN 15 08/22/2018   CREATININE 0.77 08/22/2018   BILITOT 0.4 08/22/2018   ALKPHOS 97 08/22/2018   AST 23 08/22/2018   ALT 30 08/22/2018   PROT 6.4 08/22/2018   ALBUMIN 4.4 08/22/2018   CALCIUM 10.0 08/22/2018   GFRAA 90 08/22/2018    Speciality Comments: No specialty comments available.  Procedures:  No procedures performed Allergies:  Cefdinir; Penicillins; Tape; and Penicillamine   Assessment / Plan:     Visit Diagnoses: Primary osteoarthritis of both hands-patient has DIP and PIP thickening in her hands consistent with osteoarthritis.  She has been having increasing stiffness.  Hand muscle strengthening exercises were discussed.  Trochanteric bursitis of right hip-she has tenderness on palpation over right trochanteric bursa consistent with trochanteric bursitis.  I have given her IT band exercises.  I also offered  physical therapy but she would like to wait at this point.  Status post total bilateral knee replacement - March 2017 left, 05/19 right by Dr. Mayer Camel.  She is gradually recovering from her knee replacement and has been doing well.  Primary osteoarthritis of both feet-proper fitting shoes were discussed.  DDD (degenerative disc disease), cervical-she continues to have some stiffness in her cervical spine.  Osteoporosis of multiple sites-patient states that she was recently diagnosed with osteoporosis and was placed on Fosamax.  She is also taking calcium and vitamin D.  Positive ANA (antinuclear antibody) - history of, negative later. She has had autoimmune labs twice in the last 1 year. Although autoimmune labs were negative.  History of vitamin D deficiency-she is on supplement.  Other medical problems are listed as follows:  History of migraine  History of hyperlipidemia  History of hypothyroidism  History of sleep apnea - uses oxygen at night  History of endometrial cancer   Orders: No orders of the defined types were placed in this encounter.  No orders of the defined types were placed in this encounter.   Face-to-face time spent with patient was 30 minutes. Greater than 50% of time was spent in counseling and coordination of care.  Follow-Up Instructions: Return in about 6 months (around 03/30/2019) for Osteoarthritis.   Bo Merino, MD  Note - This record has been created using  Editor, commissioning.  Chart creation errors have been sought, but may not always  have been located. Such creation errors do not reflect on  the standard of medical care.

## 2018-09-28 ENCOUNTER — Ambulatory Visit (INDEPENDENT_AMBULATORY_CARE_PROVIDER_SITE_OTHER): Payer: Medicare Other | Admitting: Rheumatology

## 2018-09-28 ENCOUNTER — Encounter (INDEPENDENT_AMBULATORY_CARE_PROVIDER_SITE_OTHER): Payer: Self-pay

## 2018-09-28 ENCOUNTER — Encounter: Payer: Self-pay | Admitting: Rheumatology

## 2018-09-28 VITALS — BP 118/68 | HR 85 | Resp 13 | Ht 67.0 in | Wt 168.4 lb

## 2018-09-28 DIAGNOSIS — M19042 Primary osteoarthritis, left hand: Secondary | ICD-10-CM

## 2018-09-28 DIAGNOSIS — R768 Other specified abnormal immunological findings in serum: Secondary | ICD-10-CM

## 2018-09-28 DIAGNOSIS — M19071 Primary osteoarthritis, right ankle and foot: Secondary | ICD-10-CM

## 2018-09-28 DIAGNOSIS — M19072 Primary osteoarthritis, left ankle and foot: Secondary | ICD-10-CM

## 2018-09-28 DIAGNOSIS — M7061 Trochanteric bursitis, right hip: Secondary | ICD-10-CM | POA: Diagnosis not present

## 2018-09-28 DIAGNOSIS — Z8639 Personal history of other endocrine, nutritional and metabolic disease: Secondary | ICD-10-CM

## 2018-09-28 DIAGNOSIS — M19041 Primary osteoarthritis, right hand: Secondary | ICD-10-CM | POA: Diagnosis not present

## 2018-09-28 DIAGNOSIS — Z96653 Presence of artificial knee joint, bilateral: Secondary | ICD-10-CM | POA: Insufficient documentation

## 2018-09-28 DIAGNOSIS — Z8542 Personal history of malignant neoplasm of other parts of uterus: Secondary | ICD-10-CM

## 2018-09-28 DIAGNOSIS — Z8669 Personal history of other diseases of the nervous system and sense organs: Secondary | ICD-10-CM

## 2018-09-28 DIAGNOSIS — M5030 Other cervical disc degeneration, unspecified cervical region: Secondary | ICD-10-CM

## 2018-09-28 DIAGNOSIS — M810 Age-related osteoporosis without current pathological fracture: Secondary | ICD-10-CM

## 2018-09-28 NOTE — Patient Instructions (Signed)
Hand Exercises Hand exercises can be helpful to almost anyone. These exercises can strengthen the hands, improve flexibility and movement, and increase blood flow to the hands. These results can make work and daily tasks easier. Hand exercises can be especially helpful for people who have joint pain from arthritis or have nerve damage from overuse (carpal tunnel syndrome). These exercises can also help people who have injured a hand. Most of these hand exercises are fairly gentle stretching routines. You can do them often throughout the day. Still, it is a good idea to ask your health care provider which exercises would be best for you. Warming your hands before exercise may help to reduce stiffness. You can do this with gentle massage or by placing your hands in warm water for 15 minutes. Also, make sure you pay attention to your level of hand pain as you begin an exercise routine. Exercises Knuckle Bend Repeat this exercise 5-10 times with each hand. 1. Stand or sit with your arm, hand, and all five fingers pointed straight up. Make sure your wrist is straight. 2. Gently and slowly bend your fingers down and inward until the tips of your fingers are touching the tops of your palm. 3. Hold this position for a few seconds. 4. Extend your fingers out to their original position, all pointing straight up again.  Finger Fan Repeat this exercise 5-10 times with each hand. 1. Hold your arm and hand out in front of you. Keep your wrist straight. 2. Squeeze your hand into a fist. 3. Hold this position for a few seconds. 4. Fan out, or spread apart, your hand and fingers as much as possible, stretching every joint fully.  Tabletop Repeat this exercise 5-10 times with each hand. 1. Stand or sit with your arm, hand, and all five fingers pointed straight up. Make sure your wrist is straight. 2. Gently and slowly bend your fingers at the knuckles where they meet the hand until your hand is making an  upside-down L shape. Your fingers should form a tabletop. 3. Hold this position for a few seconds. 4. Extend your fingers out to their original position, all pointing straight up again.  Making Os Repeat this exercise 5-10 times with each hand. 1. Stand or sit with your arm, hand, and all five fingers pointed straight up. Make sure your wrist is straight. 2. Make an O shape by touching your pointer finger to your thumb. Hold for a few seconds. Then open your hand wide. 3. Repeat this motion with each finger on your hand.  Table Spread Repeat this exercise 5-10 times with each hand. 1. Place your hand on a table with your palm facing down. Make sure your wrist is straight. 2. Spread your fingers out as much as possible. Hold this position for a few seconds. 3. Slide your fingers back together again. Hold for a few seconds.  Ball Grip  Repeat this exercise 10-15 times with each hand. 1. Hold a tennis ball or another soft ball in your hand. 2. While slowly increasing pressure, squeeze the ball as hard as possible. 3. Squeeze as hard as you can for 3-5 seconds. 4. Relax and repeat.  Wrist Curls Repeat this exercise 10-15 times with each hand. 1. Sit in a chair that has armrests. 2. Hold a light weight in your hand, such as a dumbbell that weighs 1-3 pounds (0.5-1.4 kg). Ask your health care provider what weight would be best for you. 3. Rest your hand just over   the end of the chair arm with your palm facing up. 4. Gently pivot your wrist up and down while holding the weight. Do not twist your wrist from side to side.  Contact a health care provider if:  Your hand pain or discomfort gets much worse when you do an exercise.  Your hand pain or discomfort does not improve within 2 hours after you exercise. If you have any of these problems, stop doing these exercises right away. Do not do them again unless your health care provider says that you can. Get help right away if:  You  develop sudden, severe hand pain. If this happens, stop doing these exercises right away. Do not do them again unless your health care provider says that you can. This information is not intended to replace advice given to you by your health care provider. Make sure you discuss any questions you have with your health care provider. Document Released: 10/28/2015 Document Revised: 04/23/2016 Document Reviewed: 05/27/2015 Elsevier Interactive Patient Education  2018 [place] [facility] Ask your health care provider which exercises are safe for you. Do exercises exactly as told by your health care provider and adjust them as directed. It is normal to feel mild stretching, pulling, tightness, or discomfort as you do these exercises, but you should stop right away if you feel sudden pain or your pain gets worse.Do not begin these exercises until told by your health care provider. Stretching and range of motion exercises These exercises warm up your muscles and joints and improve the movement and flexibility of your hip and pelvis. Exercise A: Quadriceps, prone  1. Lie on your abdomen on a firm surface, such as a bed or padded floor. 2. Bend your left / right knee and hold your ankle. If you cannot reach your ankle or pant leg, loop a belt around your foot and grab the belt instead. 3. Gently pull your heel toward your buttocks. Your knee should not slide out to the side. You should feel a stretch in the front of your thigh and knee. 4. Hold this position for __________ seconds. Repeat __________ times. Complete this stretch __________ times a day. Exercise B: Iliotibial band  1. Lie on your side with your left / right leg in the top position. 2. Bend both of your knees and grab your left / right ankle. Stretch out your bottom arm to help you balance. 3. Slowly bring your top knee back so your thigh goes behind your trunk. 4. Slowly lower your top leg toward the floor until  you feel a gentle stretch on the outside of your left / right hip and thigh. If you do not feel a stretch and your knee will not fall farther, place the heel of your other foot on top of your knee and pull your knee down toward the floor with your foot. 5. Hold this position for __________ seconds. Repeat __________ times. Complete this stretch __________ times a day. Strengthening exercises These exercises build strength and endurance in your hip and pelvis. Endurance is the ability to use your muscles for a long time, even after they get tired. Exercise C: Straight leg raises ( hip abductors) 1. Lie on your side with your left / right leg in the top position. Lie so your head, shoulder, knee, and hip line up. You may bend your bottom knee to help you balance. 2. Roll your hips slightly forward so your hips are stacked directly over each other and  your left / right knee is facing forward. 3. Tense the muscles in your outer thigh and lift your top leg 4-6 inches (10-15 cm). 4. Hold this position for __________ seconds. 5. Slowly return to the starting position. Let your muscles relax completely before doing another repetition. Repeat __________ times. Complete this exercise __________ times a day. Exercise D: Straight leg raises ( hip extensors) 1. Lie on your abdomen on your bed or a firm surface. You can put a pillow under your hips if that is more comfortable. 2. Bend your left / right knee so your foot is straight up in the air. 3. Squeeze your buttock muscles and lift your left / right thigh off the bed. Do not let your back arch. 4. Tense this muscle as hard as you can without increasing any knee pain. 5. Hold this position for __________ seconds. 6. Slowly lower your leg to the starting position and allow it to relax completely. Repeat __________ times. Complete this exercise __________ times a day. Exercise E: Hip hike 1. Stand sideways on a bottom step. Stand on your left / right leg  with your other foot unsupported next to the step. You can hold onto the railing or wall if needed for balance. 2. Keep your knees straight and your torso square. Then, lift your left / right hip up toward the ceiling. 3. Slowly let your left / right hip lower toward the floor, past the starting position. Your foot should get closer to the floor. Do not lean or bend your knees. Repeat __________ times. Complete this exercise __________ times a day. This information is not intended to replace advice given to you by your health care provider. Make sure you discuss any questions you have with your health care provider. Document Released: 11/16/2005 Document Revised: 07/21/2016 Document Reviewed: 10/18/2015 Elsevier Interactive Patient Education  [employer].

## 2018-12-08 ENCOUNTER — Ambulatory Visit: Payer: Medicare Other | Admitting: Cardiovascular Disease

## 2019-01-31 LAB — LIPID PANEL
Chol/HDL Ratio: 2.9 ratio (ref 0.0–4.4)
Cholesterol, Total: 166 mg/dL (ref 100–199)
HDL: 57 mg/dL (ref >39)
LDL Calculated: 95 mg/dL (ref 0–99)
Triglycerides: 72 mg/dL (ref 0–149)
VLDL Cholesterol Cal: 14 mg/dL (ref 5–40)

## 2019-01-31 LAB — COMPREHENSIVE METABOLIC PANEL
ALT: 30 IU/L (ref 0–32)
AST: 27 IU/L (ref 0–40)
Albumin/Globulin Ratio: 2.4 — ABNORMAL HIGH (ref 1.2–2.2)
Albumin: 4.5 g/dL (ref 3.8–4.8)
Alkaline Phosphatase: 91 IU/L (ref 39–117)
BUN/Creatinine Ratio: 23 (ref 12–28)
BUN: 17 mg/dL (ref 8–27)
Bilirubin Total: 0.3 mg/dL (ref 0.0–1.2)
CO2: 23 mmol/L (ref 20–29)
Calcium: 9.6 mg/dL (ref 8.7–10.3)
Chloride: 105 mmol/L (ref 96–106)
Creatinine, Ser: 0.74 mg/dL (ref 0.57–1.00)
GFR calc Af Amer: 95 mL/min/{1.73_m2} (ref >59)
GFR calc non Af Amer: 82 mL/min/{1.73_m2} (ref >59)
Globulin, Total: 1.9 g/dL (ref 1.5–4.5)
Glucose: 95 mg/dL (ref 65–99)
Potassium: 4.8 mmol/L (ref 3.5–5.2)
Sodium: 142 mmol/L (ref 134–144)
Total Protein: 6.4 g/dL (ref 6.0–8.5)

## 2019-02-06 ENCOUNTER — Ambulatory Visit: Payer: Medicare HMO | Admitting: Cardiovascular Disease

## 2019-02-06 ENCOUNTER — Encounter: Payer: Self-pay | Admitting: Cardiovascular Disease

## 2019-02-06 VITALS — BP 126/80 | HR 85 | Ht 67.0 in | Wt 173.4 lb

## 2019-02-06 DIAGNOSIS — E039 Hypothyroidism, unspecified: Secondary | ICD-10-CM | POA: Diagnosis not present

## 2019-02-06 DIAGNOSIS — Z8249 Family history of ischemic heart disease and other diseases of the circulatory system: Secondary | ICD-10-CM | POA: Diagnosis not present

## 2019-02-06 DIAGNOSIS — IMO0002 Reserved for concepts with insufficient information to code with codable children: Secondary | ICD-10-CM

## 2019-02-06 DIAGNOSIS — M329 Systemic lupus erythematosus, unspecified: Secondary | ICD-10-CM

## 2019-02-06 DIAGNOSIS — E785 Hyperlipidemia, unspecified: Secondary | ICD-10-CM

## 2019-02-06 DIAGNOSIS — Z79899 Other long term (current) drug therapy: Secondary | ICD-10-CM | POA: Diagnosis not present

## 2019-02-06 MED ORDER — ROSUVASTATIN CALCIUM 10 MG PO TABS: 10.0000 mg | ORAL_TABLET | Freq: Every day | ORAL | 3 refills | Status: DC

## 2019-02-06 NOTE — Progress Notes (Signed)
Patient ID: Brooke Williams, female   DOB: 1946/08/01, 73 y.o.   MRN: [id number]     HPI: Brooke Williams is a 73 y.o. female who presents for a 6 month follow-up cardiology evaluation.   Brooke Williams has a history of lupus erythematosus, hyperlipidemia, and strong family history for premature coronary disease. 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 In the past I tried initiating therapy with Simcor 500/20 due to her lipid parameters but she was unable to tolerate this. Subsequently, we tried low-dose Crestor but again she stopped it secondary to development of vague myalgias.  She was diagnosed with endometrial cancer and underwent complete hysterectomy in January 2014.  Initially she did not require chemotherapy or radiation.  However, since I last saw her , she develop recurrence in 2015  and required surgery in February 2015.  She received paclitaxel and carboplatin chemotherapy.  She also has had several procedures for removal of lipoma from her lower back.  She is involved in clinical research study at [facility] evaluating the use of metformin for recurrent cancer.  When I  saw her in December 2016 her sister had recently undergone placement of 2 coronary stents and her mother's twin sister had just undergone CABG surgery. She had undergone previous left knee replacement several years ago.  She continues to be followed closely at [facility] and she brought with her recent results of her CT scans where she was found to have a stable right lower lobe 1.1 cm nodule.  There was no evidence for recurrent or metastatic disease in the abdomen or pelvis  I last saw her in May 2019 for preoperative clearance prior to undergoing right knee replacement with Dr. Frederik Pear. A  preoperative clearance  ECG showed small Q waves in 3 and nondiagnostic diminutive Q wave in aVF.  Because of her ECG she was referred to me today prior to undergoing surgery the following day.  I felt she was stable from a cardiac standpoint.  She underwent surgery successfully without cardiovascular compromise.  During that evaluation I noticed that she had had laboratory from her primary physician in November 2018 and her cholesterol was elevated at 226 with an LDL of 148.  She apparently was given a prescription for Crestor 5 mg but she never started this.  She developed the flu in January and then required surgery in February for rectal prolapse.    When I saw her in May 2019 I recommended initiation of Crestor. She started at 5 mg with plans to titrate to 10 as tolerated.  If she was unable to tolerate Crestor then Zetia was to be added and potentially she could be a candidate for PCSK9 inhibition.  She has tolerated Crestor 5 mg but never titrated this to 10 mg.  Most recent lipid studies this past week has shown marked improvement in LDL to 89.  However, she states that she stopped taking Crestor around the time of her surgery but several weeks ago reinstituted this and as result she believes her levels may actually be further improved.  She was recently seen by her primary physician and she is to start Fosamax.  She denies chest pain.  She denies palpitations.   I last saw her in September 2019.  Apparently she  had not started the Crestor initially and waited until completed healing from knee surgery prior to instituting therapy.  I recommended increased activity.  The past 6 months, she has done fairly well.  She is trying to be more active.  She underwent repeat laboratory on January 31, 2019.  Total cholesterol was 166 triglycerides had improved from 150 down to 72 HDL was 57 and LDL 95.  She was tolerant she is tolerating Crestor 5 mg but never titrated up to 10 mg.  She denies any palpitations.  She  has been on levothyroxine 75 mcg for hypothyroidism.  She denies chest pain.  She presents for evaluation.  Past Medical History:  Diagnosis Date  . Arthritis    arthritis-hands. cervical disc degeneration  . Back pain    6th vertebrae has collapse on 7th,pinched nerve feeling occ  . Dizziness    rarely,takes Meclizine if needed  . Endometrial cancer ([place]) 2014  . GERD (gastroesophageal reflux disease)    controls with Zantac  . Headache(784.0)    headaches frequently but states since placed on 2L/m via N/C headaches have improved  . History of colon polyps    benign  . History of staph infection   . Hyperlipidemia    takes krill oil daily  . Hypothyroidism    takes Synthroid daily  . Ileus ([place]) 10/2015   was treated  . Joint pain   . Joint swelling   . Lupus ([place])    remission since 2008  . Nocturia   . OSA (obstructive sleep apnea)    sleep study in epic from 11-01-15.Uses Oxygen  . Pneumonia    hx of-within the last 15 yrs  . PONV (postoperative nausea and vomiting)   . Restless leg syndrome    takes Mirapex nightly  . Seasonal allergies    takes Allegra daily and uses Astelin Nasal Spray as needed    Past Surgical History:  Procedure Laterality Date  . ABDOMINAL HYSTERECTOMY    . BRAIN SURGERY     left cataract  . COLON SURGERY     12/2017  . COLONOSCOPY    . DILATION AND CURETTAGE OF UTERUS     2008-polyp removed  . HAND SURGERY  2006   left thumb surgery with pin  . KNEE ARTHROPLASTY    . KNEE ARTHROSCOPY Left 04/08/2018   Procedure: ARTHROSCOPY LEFT KNEE;  Surgeon: Frederik Pear, MD;  Location: [facility];  Service: Orthopedics;  Laterality: Left;  . lipomas removed from back   40 yrs ago  . MASS EXCISION N/A 10/05/2014   Procedure: EXCISION OF BILATERAL LOWER BACK MASSES;  Surgeon: Coralie Keens, MD;  Location: [facility];  Service: General;  Laterality: N/A;  . MEDIAL PARTIAL KNEE REPLACEMENT  2005   LT knee  . OTHER SURGICAL HISTORY      metal pin/screw to repair arthritis in LT thumb joint  . ROBOTIC ASSISTED TOTAL HYSTERECTOMY WITH BILATERAL SALPINGO OOPHERECTOMY  12/20/2012   Procedure: ROBOTIC ASSISTED TOTAL HYSTERECTOMY WITH BILATERAL SALPINGO OOPHORECTOMY;  Surgeon: Imagene Gurney A. Alycia Rossetti, MD;  Location: [facility];  Service: Gynecology;  Laterality: N/A;  WITH PELVIC PARAAORTIC   . ROBOTIC PELVIC AND PARA-AORTIC LYMPH NODE DISSECTION  12/20/2012   Procedure: ROBOTIC PELVIC AND PARA-AORTIC LYMPH NODE DISSECTION;  Surgeon: Imagene Gurney A. Alycia Rossetti, MD;  Location: [facility];  Service: Gynecology;  Laterality: Bilateral;  . TOTAL KNEE ARTHROPLASTY Right 04/08/2018   Procedure: RIGHT TOTAL KNEE ARTHROPLASTY;  Surgeon: Frederik Pear, MD;  Location:  [facility];  Service: Orthopedics;  Laterality: Right;  Right TKA and Left Knee Arthroscopy  . TOTAL KNEE REVISION Left 02/03/2016   Procedure: TOTAL KNEE REVISION;  Surgeon: Frederik Pear, MD;  Location: [facility];  Service: Orthopedics;  Laterality: Left;    Allergies  Allergen Reactions  . Cefdinir Hives, Itching and Rash  . Penicillins Hives    CHILDHOOD REACTION Has patient had a PCN reaction causing immediate rash, facial/tongue/throat swelling, SOB or lightheadedness with hypotension: #  #  NO  #  #  Has patient had a PCN reaction occurring within the last 10 years:#  #  #  YES  #  #  #   If all of the above answers are "NO", then may proceed with Cephalosporin use.  . Tape Itching and Rash    Skin turns red Bandaid  . Penicillamine Rash    Current Outpatient Medications  Medication Sig Dispense Refill  . Alpha-Lipoic Acid 600 MG CAPS Take by mouth daily.    . ASTRAGALUS PO Take 1 capsule by mouth 2 (two) times daily.     Marland Kitchen azelastine (ASTELIN) 0.1 % nasal spray Place 2 sprays into both nostrils daily as needed for rhinitis or allergies. Use in each nostril as directed    . BLACK COHOSH PO Take 1 capsule by mouth daily.     Marland Kitchen CALCIUM PO Take 743 mg by mouth daily.    Marland Kitchen doxycycline (VIBRA-TABS) 100 MG  tablet Take 1 tablet by mouth daily.    . famotidine (PEPCID) 20 MG tablet Take 20 mg by mouth as needed for heartburn or indigestion.    . fluocinonide gel (LIDEX) 0.05 % APPLY TO AFFECTED AREA twice a day as needed for canker sores.  1  . hyoscyamine (ANASPAZ) 0.125 MG TBDP disintergrating tablet Place 0.125 mg under the tongue 2 (two) times daily as needed for cramping.    . loratadine-pseudoephedrine (CLARITIN-D 12-HOUR) 5-120 MG tablet Take 1 tablet by mouth daily as needed for allergies.    . Multiple Vitamin (MULTIVITAMIN WITH MINERALS) TABS Take 1 tablet by mouth daily.    . naproxen (NAPROSYN) 500 MG tablet Take 500 mg by mouth 2 (two) times daily with a meal.    . Polyethyl Glycol-Propyl Glycol (SYSTANE OP) Apply 1 drop to eye every 8 (eight) hours as needed (dry eyes).    . Psyllium (METAMUCIL FIBER PO) Take 5 capsules by mouth daily.     Marland Kitchen SYNTHROID 75 MCG tablet Take 75 mcg by mouth daily before breakfast.     . triamcinolone cream (KENALOG) 0.1 % Apply 1 application topically 2 (two) times daily as needed (rash).     . rosuvastatin (CRESTOR) 10 MG tablet Take 1 tablet (10 mg total) by mouth daily. 90 tablet 3   No current facility-administered medications for this visit.     Social History   Socioeconomic History  . Marital status: Married    Spouse name: Not on file  . Number of children: 0  . Years of education: Not on file  . Highest education level: Not on file  Occupational History  . Not on file  Social Needs  . Financial resource strain: Not on file  . Food insecurity:    Worry: Not on file    Inability: Not on file  . Transportation needs:    Medical: Not on file    Non-medical: Not on file  Tobacco Use  . Smoking status: Never Smoker  . Smokeless tobacco: Never  Used  Substance and Sexual Activity  . Alcohol use: Yes    Comment: occassional  . Drug use: No  . Sexual activity: Not Currently    Birth control/protection: Post-menopausal  Lifestyle  .  Physical activity:    Days per week: Not on file    Minutes per session: Not on file  . Stress: Not on file  Relationships  . Social connections:    Talks on phone: Not on file    Gets together: Not on file    Attends religious service: Not on file    Active member of club or organization: Not on file    Attends meetings of clubs or organizations: Not on file    Relationship status: Not on file  . Intimate partner violence:    Fear of current or ex partner: Not on file    Emotionally abused: Not on file    Physically abused: Not on file    Forced sexual activity: Not on file  Other Topics Concern  . Not on file  Social History Narrative   Patient does not have any biological children but has step children    Family History  Problem Relation Age of Onset  . Stroke Mother 75       arterial wall of artery in eye   . Endometrial cancer Mother 21  . Dementia Father   . Stroke Father        "lots of many strokes"  . Aneurysm Father 16       AAA  . Multiple births Maternal Grandmother        died 5 days after delivery of unknown causes  . Colon cancer Maternal Aunt        late 55s  . Cancer Cousin        maternal cousin with an unknown form of cancer  . Breast cancer Neg Hx    Additional social history is that I take care of her husband Mr. Astha Probasco. There is no history of ever smoking tobacco. Occasional alcohol use.   ROS General: Negative; No fevers, chills, or night sweats;  HEENT: Negative; No changes in vision or hearing, sinus congestion, difficulty swallowing Pulmonary: Negative; No cough, wheezing, shortness of breath, hemoptysis Cardiovascular: Negative; No chest pain, presyncope, syncope, palpitations GI: Negative; No nausea, vomiting, diarrhea, or abdominal pain GU: Negative; No dysuria, hematuria, or difficulty voiding Musculoskeletal: status post successful knee surgery Hematologic/Oncology: Positive for endometrial cancer Endocrine: Negative; no  heat/cold intolerance; no diabetes Neuro: Negative; no changes in balance, headaches Skin: Negative; No rashes or skin lesions Psychiatric: Negative; No behavioral problems, depression Sleep: Negative; No snoring, daytime sleepiness, hypersomnolence, bruxism, restless legs, hypnogognic hallucinations, no cataplexy Other comprehensive 14 point system review is negative.  PE BP 126/80   Pulse 85   Ht 5' 7"  (1.702 m)   Wt 173 lb 6.4 oz (78.7 kg)   BMI 27.16 kg/m    Repeat blood pressure by me 120/76  Wt Readings from Last 3 Encounters:  02/06/19 173 lb 6.4 oz (78.7 kg)  09/28/18 168 lb 6.4 oz (76.4 kg)  08/24/18 165 lb 3.2 oz (74.9 kg)   General: Alert, oriented, no distress.  Skin: normal turgor, no rashes, warm and dry HEENT: Normocephalic, atraumatic. Pupils equal round and reactive to light; sclera anicteric; extraocular muscles intact;  Nose without nasal septal hypertrophy Mouth/Parynx benign; Mallinpatti scale 2 Neck: No JVD, no carotid bruits; normal carotid upstroke Lungs: clear to ausculatation and percussion; no wheezing or  rales Chest wall: without tenderness to palpitation Heart: PMI not displaced, RRR, s1 s2 normal, 1/6 systolic murmur, no diastolic murmur, no rubs, gallops, thrills, or heaves Abdomen: soft, nontender; no hepatosplenomehaly, BS+; abdominal aorta nontender and not dilated by palpation. Back: no CVA tenderness Pulses 2+ Musculoskeletal: full range of motion, normal strength, no joint deformities Extremities: no clubbing cyanosis or edema, Homan's sign negative  Neurologic: grossly nonfocal; Cranial nerves grossly wnl Psychologic: Normal mood and affect   ECG (independently read by me): Sinus rhythm at 85 bpm.  No ectopy.  Normal intervals.  September 2019 ECG (independently read by me): Normal sinus rhythm at 91 bpm.  Normal intervals.  No ectopy.  May 2019 ECG (independently read by me): Normal sinus rhythm at 84 bpm.  Possible borderline left  atrial enlargement.  No ectopy. Normal intervals.  No evidence for prior MI.  Q waves in lead III.  Normal R waves in lead II and aVF  September 2016 ECG (independently read by me): Normal sinus rhythm at 74 bpm.  No ectopy.  Normal intervals.  ECG: Sinus rhythm at 69 beats per minute. No ectopy. Normal intervals.  LABS: BMP Latest Ref Rng & Units 01/31/2019 08/22/2018 04/09/2018  Glucose 65 - 99 mg/dL 95 89 110(H)  BUN 8 - 27 mg/dL 17 15 12   Creatinine 0.57 - 1.00 mg/dL 0.74 0.77 0.68  BUN/Creat Ratio 12 - 28 23 19  -  Sodium 134 - 144 mmol/L 142 143 136  Potassium 3.5 - 5.2 mmol/L 4.8 4.8 4.0  Chloride 96 - 106 mmol/L 105 105 104  CO2 20 - 29 mmol/L 23 24 23   Calcium 8.7 - 10.3 mg/dL 9.6 10.0 8.6(L)   Hepatic Function Latest Ref Rng & Units 01/31/2019 08/22/2018 07/29/2017  Total Protein 6.0 - 8.5 g/dL 6.4 6.4 7.1  Albumin 3.8 - 4.8 g/dL 4.5 4.4 4.2  AST 0 - 40 IU/L 27 23 52(H)  ALT 0 - 32 IU/L 30 30 61(H)  Alk Phosphatase 39 - 117 IU/L 91 97 100  Total Bilirubin 0.0 - 1.2 mg/dL 0.3 0.4 0.6   CBC Latest Ref Rng & Units 04/09/2018 03/31/2018 07/29/2017  WBC 4.0 - 10.5 K/uL 9.4 7.8 7.0  Hemoglobin 12.0 - 15.0 g/dL 11.1(L) 14.2 13.7  Hematocrit 36.0 - 46.0 % 34.1(L) 43.2 39.9  Platelets 150 - 400 K/uL 184 257 229   Lab Results  Component Value Date   MCV 92.9 04/09/2018   MCV 92.9 03/31/2018   MCV 91.5 07/29/2017   Lab Results  Component Value Date   TSH 3.618 09/06/2015    No results found for: HGBA1C   Lipid Panel     Component Value Date/Time   CHOL 166 01/31/2019 0903   TRIG 72 01/31/2019 0903   HDL 57 01/31/2019 0903   CHOLHDL 2.9 01/31/2019 0903   CHOLHDL 3.7 09/06/2015 0914   VLDL 32 (H) 09/06/2015 0914   LDLCALC 95 01/31/2019 0903     RADIOLOGY: No results found.  Prior  2016 NMR lipoprotein: significant increase LDL particle #2251, LDL calculated 152, HDL 54, triglycerides 180, total cholesterol 242, small LDL particle #1427, and insulin resistance coronary  increased at 49.   IMPRESSION:  1. Medication management   2. Hyperlipidemia LDL goal <70   3. Family history of premature CAD   4. Hypothyroidism, unspecified type   5. History of lupus ([place])     ASSESSMENT AND PLAN: Brooke Williams is a 73 year old female who has a strong family history for  premature coronary artery disease with several family members having undergone cardiovascular interventions.  She has a history of hyperlipidemia and remotely had tried statins.  She ultimately was restarted back on a trial of rosuvastatin and she now has been able to tolerate 5 mg daily.  Her most recent lipid panel however still shows an LDL of 95.  Her triglycerides are significantly improved from 150 down to 72.  I am recommending now that since she is tolerating the 5 mg that she try alternating 5 mg with 10 mg every other day for several weeks and if she tolerates this then try 2 out of 3 days taking 10 mg with ultimate plans to take 10 mg daily.  Her blood pressure today is stable.  Her rhythm is stable with sinus rhythm.  She continues to be on levothyroxine for hypothyroidism.  She is status post knee surgery.  At times she takes Naprosyn but does not take this regularly.  We discussed the importance of trying to exercise.  She will be following up with her primary physician.  I will see her in 6 months for reevaluation.  Time spent: 25 minutes  Troy Sine, MD, [facility]  02/07/2019 4:48 PM

## 2019-02-06 NOTE — Patient Instructions (Signed)
Medication Instructions:  Increase Rosuvastatin to 10 mg daily. If you need a refill on your cardiac medications before your next appointment, please call your pharmacy.   Lab work: Fasting lab work in 6 months (CBC, CMET, TSH, LIPID)  If you have labs (blood work) drawn today and your tests are completely normal, you will receive your results only by: Marland Kitchen MyChart Message (if you have MyChart) OR . A paper copy in the mail If you have any lab test that is abnormal or we need to change your treatment, we will call you to review the results.    Follow-Up: At [facility], you and your health needs are our priority.  As part of our continuing mission to provide you with exceptional heart care, we have created designated Provider Care Teams.  These Care Teams include your primary Cardiologist (physician) and Advanced Practice Providers (APPs -  Physician Assistants and Nurse Practitioners) who all work together to provide you with the care you need, when you need it. You will need a follow up appointment in 6 months.  Please call our office 2 months in advance to schedule this appointment.  You may see Dr.Kelly or one of the following Advanced Practice Providers on your designated Care Team: Almyra Deforest, [place] . Fabian Sharp, PA-C

## 2019-02-07 ENCOUNTER — Encounter: Payer: Self-pay | Admitting: Cardiovascular Disease

## 2019-03-29 ENCOUNTER — Ambulatory Visit: Payer: Medicare Other | Admitting: Rheumatology

## 2019-04-10 ENCOUNTER — Encounter: Payer: Self-pay | Admitting: *Deleted

## 2019-04-10 ENCOUNTER — Other Ambulatory Visit: Payer: Self-pay

## 2019-04-10 ENCOUNTER — Encounter: Payer: Self-pay | Admitting: Internal Medicine

## 2019-04-10 ENCOUNTER — Other Ambulatory Visit: Payer: Self-pay | Admitting: Internal Medicine

## 2019-04-10 ENCOUNTER — Ambulatory Visit: Payer: Medicare HMO | Admitting: Internal Medicine

## 2019-04-10 VITALS — BP 110/70 | HR 82 | Temp 98.3°F | Ht 67.0 in | Wt 173.0 lb

## 2019-04-10 DIAGNOSIS — J9611 Chronic respiratory failure with hypoxia: Secondary | ICD-10-CM | POA: Diagnosis not present

## 2019-04-10 NOTE — Progress Notes (Signed)
Subjective:    Patient ID: Brooke Williams, female    DOB: 1946/03/19,   MRN: [id number]   Brief patient profile:  89 yowf never smoker with dx of sle 1996 rx plaquenil stopped in 2008 and no recurrence underwent sleep eval for freq disruption and fatigue since June 2016 and neg PSG 11/01/15  except for restless legs and decreased sats to a nadir of 88% so referred to pulmonary clinic 11/21/2015 by Dr Dohmeier       History of Present Illness  11/21/2015 1st [facility] office visit/ Jp Eastham   Chief Complaint  Patient presents with   PULMONARY CONSULT    Referred by Dr. Brett Fairy. Pt c/o waking though the night, waking with headaches in the mornings, excessive fatigue and daytime somnolence. Pt has had sleep study done that showed nocturnal hypoxia.   finished chemo for endometrial ca July 2015 taxol and carboplat  And not aware of any sign doe/ cough hs or any other time  rec ono RA 11/26/15 desat x 30min 32 sec rec 2lpm > much better noct symptoms   09/30/2016  f/u ov/Yomaris Palecek re: noct hypoxemia Chief Complaint  Patient presents with   Follow-up    pt doing well, no complaints today.  pt scheduled appt because she is needing 02 qualification sats per DME company.  Pt only wears 02 qhs.    wearing 02 2lpm q hs/ sleeping better, wakes up feeling refreshed vs without it and no HA  Riding ex bike up to 20 min s sob  rec You should continue 02 at 2lpm indefinitely Continue to exercise regularly and let me know if you are losing ground in terms of exercise tolerance at any point    04/10/2019  f/u ov/Myldred Raju re: noct hypoxemia ? Etiology / needs recert for 02  Chief Complaint  Patient presents with   Follow-up    Pt states here to recertify for her nocturnal o2. She states has occ SOB when she wakes up in the morning- has had issues with mold at home recently.   Dyspnea:  Ex bike x 20 min x 5 days a week on #4 resistance (was on #1) on 1 mile / 4 min  Cough: none Sleeping: on side  bed is flat/ sev pillows / ha and fatigue if not wearing 02 all night  SABA use: none  02: 2lpm not humidity    No obvious day to day or daytime variability or assoc excess/ purulent sputum or mucus plugs or hemoptysis or cp or chest tightness, subjective wheeze or overt sinus or hb symptoms.   Sleeping  without nocturnal  or early am exacerbation  of respiratory  c/o's or need for noct saba. Also denies any obvious fluctuation of symptoms with weather or environmental changes or other aggravating or alleviating factors except as outlined above   No unusual exposure hx or h/o childhood pna/ asthma or knowledge of premature birth.  Current Allergies, Complete Past Medical History, Past Surgical History, Family History, and Social History were reviewed in [employer] record.  ROS  The following are not active complaints unless bolded Hoarseness, sore throat, dysphagia, dental problems, itching, sneezing,  nasal congestion or discharge of excess mucus or purulent secretions, ear ache,   fever, chills, sweats, unintended wt loss or wt gain, classically pleuritic or exertional cp,  orthopnea pnd or arm/hand swelling  or leg swelling, presyncope, palpitations, abdominal pain, anorexia, nausea, vomiting, diarrhea  or change in bowel habits or change in  bladder habits, change in stools or change in urine, dysuria, hematuria,  rash, arthralgias, visual complaints, headache, numbness, weakness or ataxia or problems with walking or coordination,  change in mood or  memory.         Current Meds  Medication Sig   Alpha-Lipoic Acid 600 MG CAPS Take by mouth daily.   ASTRAGALUS PO Take 1 capsule by mouth 2 (two) times daily.    azelastine (ASTELIN) 0.1 % nasal spray Place 2 sprays into both nostrils daily as needed for rhinitis or allergies. Use in each nostril as directed   BLACK COHOSH PO Take 1 capsule by mouth daily.    CALCIUM PO Take 743 mg by mouth daily.   doxycycline  (VIBRA-TABS) 100 MG tablet Take 1 tablet by mouth daily.   famotidine (PEPCID) 20 MG tablet Take 20 mg by mouth as needed for heartburn or indigestion.   fluocinonide gel (LIDEX) 0.05 % APPLY TO AFFECTED AREA twice a day as needed for canker sores.   loratadine-pseudoephedrine (CLARITIN-D 12-HOUR) 5-120 MG tablet Take 1 tablet by mouth daily as needed for allergies.   Multiple Vitamin (MULTIVITAMIN WITH MINERALS) TABS Take 1 tablet by mouth daily.   OXYGEN 2lpm with sleep only   Polyethyl Glycol-Propyl Glycol (SYSTANE OP) Apply 1 drop to eye every 8 (eight) hours as needed (dry eyes).   Psyllium (METAMUCIL FIBER PO) Take 5 capsules by mouth daily.    rosuvastatin (CRESTOR) 10 MG tablet Take 1 tablet (10 mg total) by mouth daily.   SYNTHROID 75 MCG tablet Take 75 mcg by mouth daily before breakfast.                Objective:   Physical Exam   amb wf nad   04/10/2019        173  09/30/2016        169   11/21/15 176 lb 9.6 oz (80.105 kg)  11/19/15 174 lb (78.926 kg)  10/28/15 179 lb (81.194 kg)    Vital signs reviewed - Note on arrival 02 sats  98% on RA      HEENT: nl dentition, turbinates bilaterally, and oropharynx. Nl external ear canals without cough reflex   NECK :  without JVD/Nodes/TM/ nl carotid upstrokes bilaterally   LUNGS: no acc muscle use,  Nl contour chest which is clear to A and P bilaterally without cough on insp or exp maneuvers   CV:  RRR  no s3 or murmur or increase in P2, and no edema   ABD:  soft and nontender with nl inspiratory excursion in the supine position. No bruits or organomegaly appreciated, bowel sounds nl  MS:  Nl gait/ ext warm without deformities, calf tenderness, cyanosis or clubbing No obvious joint restrictions   SKIN: warm and dry without lesions    NEURO:  alert, approp, nl sensorium with  no motor or cerebellar deficits apparent.                Assessment & Plan:

## 2019-04-10 NOTE — Patient Instructions (Addendum)
To get the most out of exercise, you need to be continuously aware that you are short of breath, but never out of breath, for 30 minutes daily. As you improve, it will actually be easier for you to do the same amount of exercise  in  30 minutes so always push to the level where you are short of breath.     Please see patient coordinator before you leave today  to schedule ONO on 2lpm and humidify 02    Follow up as needed to requalify for 02  - late add:  Error: Needs ono on RA

## 2019-04-10 NOTE — Assessment & Plan Note (Addendum)
See PSG 12//2/16  37 min desats but nadir ws 88%  - 11/21/2015  Walked RA x 3 laps @ 185 ft each stopped due to  End of study, nl pace, no sob or desat   - ONO RA 11/26/2015 :  desat < 89% x  5 m 32 sec >  rec 2lpm and repeat on 2lpm > done 12/03/15 and no desat so rec continue 2lpm hs only  - 04/10/2019   Walked RA  2 laps @  approx 237ft each @ fast pace  stopped due to  End of study no sob    States can really tell it when not wearing 02 feels not rested and AM HA so rec recheck on RA to  recertify need/ in meantime humidify 02    Also rec more consistent aerobic ex daytime to improve conditioning, keep wt down   Pulmonary f/u can be prn    I had an extended discussion with the patient   reviewing all relevant studies completed to date and  lasting 15 to 20 minutes of a 25 minute visit  which included directly observing ambulatory 02 saturation study documented in a/p section of  today's  office note.  Each maintenance medication was reviewed in detail including most importantly the difference between maintenance and prns and under what circumstances the prns are to be triggered using an action plan format that is not reflected in the computer generated alphabetically organized AVS.     Please see AVS for specific instructions unique to this visit that I personally wrote and verbalized to the the pt in detail and then reviewed with pt  by my nurse highlighting any changes in therapy recommended at today's visit .

## 2019-04-14 ENCOUNTER — Telehealth: Payer: Self-pay | Admitting: Internal Medicine

## 2019-04-14 DIAGNOSIS — J9611 Chronic respiratory failure with hypoxia: Secondary | ICD-10-CM

## 2019-04-14 NOTE — Telephone Encounter (Signed)
ONO on RA done on 04/12/19 by [employer]  Per MW- confirm this was done on RA and if so ok to d/c o2  ATC the pt, line rings busy [facility]

## 2019-04-19 NOTE — Telephone Encounter (Signed)
Spoke with the pt  Test was done on RA  Spoke with pt and notified of results per Dr. Melvyn Novas. Pt verbalized understanding and denied any questions. Order sent to d/c o2

## 2019-05-18 NOTE — Progress Notes (Deleted)
Office Visit Note  Patient: Brooke Williams             Date of Birth: Feb 10, 1946           MRN: [id number]             PCP: Stephens Shire, MD Referring: Stephens Shire, MD Visit Date: 06/01/2019 Occupation: @GUAROCC @  Subjective:  No chief complaint on file.   History of Present Illness: Guillermo Difrancesco is a 73 y.o. female ***   Activities of Daily Living:  Patient reports morning stiffness for *** {minute/hour:19697}.   Patient {ACTIONS;DENIES/REPORTS:21021675::"Denies"} nocturnal pain.  Difficulty dressing/grooming: {ACTIONS;DENIES/REPORTS:21021675::"Denies"} Difficulty climbing stairs: {ACTIONS;DENIES/REPORTS:21021675::"Denies"} Difficulty getting out of chair: {ACTIONS;DENIES/REPORTS:21021675::"Denies"} Difficulty using hands for taps, buttons, cutlery, and/or writing: {ACTIONS;DENIES/REPORTS:21021675::"Denies"}  No Rheumatology ROS completed.   PMFS History:  Patient Active Problem List   Diagnosis Date Noted  . Status post total bilateral knee replacement 09/28/2018  . Primary osteoarthritis of both feet 09/28/2018  . Knee osteoarthritis 04/09/2018  . Fibrosis of left knee joint 04/09/2018  . Primary osteoarthritis of right knee 04/08/2018  . Osteoarthritis of right knee 04/06/2018  . Primary osteoarthritis of both knees 09/24/2016  . Primary osteoarthritis of both hands 09/24/2016  . DJD (degenerative joint disease), cervical 09/24/2016  . Osteopenia of multiple sites 09/24/2016  . Vitamin D deficiency 09/24/2016  . Positive ANA (antinuclear antibody) 09/24/2016  . Recurrent aphthous stomatitis 02/04/2016  . Benign neoplasm of skin of trunk 02/04/2016  . Loose total knee arthroplasty ([place]) Left 02/01/2016  . Chronic respiratory failure with hypoxia (HCC)/ noct hypoxemia 11/21/2015  . OSA (obstructive sleep apnea) 10/16/2015  . Chronic tension-type headache, not intractable 10/16/2015  . Atypical chest pain 08/28/2015  . Vasomotor flushing 07/05/2014   . Adult hypothyroidism 02/05/2014  . Hyperlipidemia 10/10/2013  . Endometrial ca ([place]) 10/10/2013  . Family history of premature CAD 10/10/2013  . Endometrial adenocarcinoma ([place]) 12/01/2012  . HEMORRHOIDS, INTERNAL 09/11/2010  . Migraine headache 03/19/2010  . ESOPHAGEAL REFLUX 03/19/2010  . FULL INCONTINENCE OF FECES 03/19/2010    Past Medical History:  Diagnosis Date  . Arthritis    arthritis-hands. cervical disc degeneration  . Back pain    6th vertebrae has collapse on 7th,pinched nerve feeling occ  . Dizziness    rarely,takes Meclizine if needed  . Endometrial cancer ([place]) 2014  . GERD (gastroesophageal reflux disease)    controls with Zantac  . Headache(784.0)    headaches frequently but states since placed on 2L/m via N/C headaches have improved  . History of colon polyps    benign  . History of staph infection   . Hyperlipidemia    takes krill oil daily  . Hypothyroidism    takes Synthroid daily  . Ileus ([place]) 10/2015   was treated  . Joint pain   . Joint swelling   . Lupus ([place])    remission since 2008  . Nocturia   . OSA (obstructive sleep apnea)    sleep study in epic from 11-01-15.Uses Oxygen  . Pneumonia    hx of-within the last 15 yrs  . PONV (postoperative nausea and vomiting)   . Restless leg syndrome    takes Mirapex nightly  . Seasonal allergies    takes Allegra daily and uses Astelin Nasal Spray as needed    Family History  Problem Relation Age of Onset  . Stroke Mother 9       arterial wall of artery in eye   . Endometrial cancer Mother 72  .  Dementia Father   . Stroke Father        "lots of many strokes"  . Aneurysm Father 67       AAA  . Multiple births Maternal Grandmother        died 5 days after delivery of unknown causes  . Colon cancer Maternal Aunt        late 55s  . Cancer Cousin        maternal cousin with an unknown form of cancer  . Breast cancer Neg Hx    Past Surgical History:  Procedure Laterality Date  .  ABDOMINAL HYSTERECTOMY    . BRAIN SURGERY     left cataract  . COLON SURGERY     12/2017  . COLONOSCOPY    . DILATION AND CURETTAGE OF UTERUS     2008-polyp removed  . HAND SURGERY  2006   left thumb surgery with pin  . KNEE ARTHROPLASTY    . KNEE ARTHROSCOPY Left 04/08/2018   Procedure: ARTHROSCOPY LEFT KNEE;  Surgeon: Frederik Pear, MD;  Location: [facility];  Service: Orthopedics;  Laterality: Left;  . lipomas removed from back   40 yrs ago  . MASS EXCISION N/A 10/05/2014   Procedure: EXCISION OF BILATERAL LOWER BACK MASSES;  Surgeon: Coralie Keens, MD;  Location: [facility];  Service: General;  Laterality: N/A;  . MEDIAL PARTIAL KNEE REPLACEMENT  2005   LT knee  . OTHER SURGICAL HISTORY     metal pin/screw to repair arthritis in LT thumb joint  . ROBOTIC ASSISTED TOTAL HYSTERECTOMY WITH BILATERAL SALPINGO OOPHERECTOMY  12/20/2012   Procedure: ROBOTIC ASSISTED TOTAL HYSTERECTOMY WITH BILATERAL SALPINGO OOPHORECTOMY;  Surgeon: Imagene Gurney A. Alycia Rossetti, MD;  Location: [facility];  Service: Gynecology;  Laterality: N/A;  WITH PELVIC PARAAORTIC   . ROBOTIC PELVIC AND PARA-AORTIC LYMPH NODE DISSECTION  12/20/2012   Procedure: ROBOTIC PELVIC AND PARA-AORTIC LYMPH NODE DISSECTION;  Surgeon: Imagene Gurney A. Alycia Rossetti, MD;  Location: [facility];  Service: Gynecology;  Laterality: Bilateral;  . TOTAL KNEE ARTHROPLASTY Right 04/08/2018   Procedure: RIGHT TOTAL KNEE ARTHROPLASTY;  Surgeon: Frederik Pear, MD;  Location: [facility];  Service: Orthopedics;  Laterality: Right;  Right TKA and Left Knee Arthroscopy  . TOTAL KNEE REVISION Left 02/03/2016   Procedure: TOTAL KNEE REVISION;  Surgeon: Frederik Pear, MD;  Location: [facility];  Service: Orthopedics;  Laterality: Left;   Social History   Social History Narrative   Patient does not have any biological children but has step children   Immunization History  Administered Date(s) Administered  . Tdap 04/04/2011     Objective: Vital Signs: There were no vitals taken for  this visit.   Physical Exam   Musculoskeletal Exam: ***  CDAI Exam: CDAI Score: - Patient Global: -; Provider Global: - Swollen: -; Tender: - Joint Exam   No joint exam has been documented for this visit   There is currently no information documented on the homunculus. Go to the Rheumatology activity and complete the homunculus joint exam.  Investigation: No additional findings.  Imaging: No results found.  Recent Labs: Lab Results  Component Value Date   WBC 9.4 04/09/2018   HGB 11.1 (L) 04/09/2018   PLT 184 04/09/2018   NA 142 01/31/2019   K 4.8 01/31/2019   CL 105 01/31/2019   CO2 23 01/31/2019   GLUCOSE 95 01/31/2019   BUN 17 01/31/2019   CREATININE 0.74 01/31/2019   BILITOT 0.3 01/31/2019   ALKPHOS 91 01/31/2019  AST 27 01/31/2019   ALT 30 01/31/2019   PROT 6.4 01/31/2019   ALBUMIN 4.5 01/31/2019   CALCIUM 9.6 01/31/2019   GFRAA 95 01/31/2019    Speciality Comments: No specialty comments available.  Procedures:  No procedures performed Allergies: Cefdinir, Penicillins, Tape, and Penicillamine   Assessment / Plan:     Visit Diagnoses: No diagnosis found.   Orders: No orders of the defined types were placed in this encounter.  No orders of the defined types were placed in this encounter.   Face-to-face time spent with patient was *** minutes. Greater than 50% of time was spent in counseling and coordination of care.  Follow-Up Instructions: No follow-ups on file.   Ofilia Neas, PA-C  Note - This record has been created using Dragon software.  Chart creation errors have been sought, but may not always  have been located. Such creation errors do not reflect on  the standard of medical care.

## 2019-06-01 ENCOUNTER — Ambulatory Visit: Payer: Self-pay | Admitting: Rheumatology

## 2019-06-06 ENCOUNTER — Institutional Professional Consult (permissible substitution): Payer: Medicare HMO | Admitting: Internal Medicine

## 2019-06-07 HISTORY — PX: WRIST SURGERY: SHX841

## 2019-06-08 NOTE — Progress Notes (Deleted)
Office Visit Note  Patient: Brooke Williams             Date of Birth: [birth year]           MRN: [id number]             PCP: Stephens Shire, MD Referring: Stephens Shire, MD Visit Date: 06/20/2019 Occupation: @GUAROCC @  Subjective:  No chief complaint on file.   History of Present Illness: Brooke Williams is a 73 y.o. female ***   Activities of Daily Living:  Patient reports morning stiffness for *** {minute/hour:19697}.   Patient {ACTIONS;DENIES/REPORTS:21021675::"Denies"} nocturnal pain.  Difficulty dressing/grooming: {ACTIONS;DENIES/REPORTS:21021675::"Denies"} Difficulty climbing stairs: {ACTIONS;DENIES/REPORTS:21021675::"Denies"} Difficulty getting out of chair: {ACTIONS;DENIES/REPORTS:21021675::"Denies"} Difficulty using hands for taps, buttons, cutlery, and/or writing: {ACTIONS;DENIES/REPORTS:21021675::"Denies"}  No Rheumatology ROS completed.   PMFS History:  Patient Active Problem List   Diagnosis Date Noted  . Status post total bilateral knee replacement 09/28/2018  . Primary osteoarthritis of both feet 09/28/2018  . Knee osteoarthritis 04/09/2018  . Fibrosis of left knee joint 04/09/2018  . Primary osteoarthritis of right knee 04/08/2018  . Osteoarthritis of right knee 04/06/2018  . Primary osteoarthritis of both knees 09/24/2016  . Primary osteoarthritis of both hands 09/24/2016  . DJD (degenerative joint disease), cervical 09/24/2016  . Osteopenia of multiple sites 09/24/2016  . Vitamin D deficiency 09/24/2016  . Positive ANA (antinuclear antibody) 09/24/2016  . Recurrent aphthous stomatitis 02/04/2016  . Benign neoplasm of skin of trunk 02/04/2016  . Loose total knee arthroplasty ([place]) Left 02/01/2016  . Chronic respiratory failure with hypoxia (HCC)/ noct hypoxemia 11/21/2015  . OSA (obstructive sleep apnea) 10/16/2015  . Chronic tension-type headache, not intractable 10/16/2015  . Atypical chest pain 08/28/2015  . Vasomotor flushing 07/05/2014   . Adult hypothyroidism 02/05/2014  . Hyperlipidemia 10/10/2013  . Endometrial ca ([place]) 10/10/2013  . Family history of premature CAD 10/10/2013  . Endometrial adenocarcinoma ([place]) 12/01/2012  . HEMORRHOIDS, INTERNAL 09/11/2010  . Migraine headache 03/19/2010  . ESOPHAGEAL REFLUX 03/19/2010  . FULL INCONTINENCE OF FECES 03/19/2010    Past Medical History:  Diagnosis Date  . Arthritis    arthritis-hands. cervical disc degeneration  . Back pain    6th vertebrae has collapse on 7th,pinched nerve feeling occ  . Dizziness    rarely,takes Meclizine if needed  . Endometrial cancer ([place]) 2014  . GERD (gastroesophageal reflux disease)    controls with Zantac  . Headache(784.0)    headaches frequently but states since placed on 2L/m via N/C headaches have improved  . History of colon polyps    benign  . History of staph infection   . Hyperlipidemia    takes krill oil daily  . Hypothyroidism    takes Synthroid daily  . Ileus ([place]) 10/2015   was treated  . Joint pain   . Joint swelling   . Lupus ([place])    remission since 2008  . Nocturia   . OSA (obstructive sleep apnea)    sleep study in epic from 11-01-15.Uses Oxygen  . Pneumonia    hx of-within the last 15 yrs  . PONV (postoperative nausea and vomiting)   . Restless leg syndrome    takes Mirapex nightly  . Seasonal allergies    takes Allegra daily and uses Astelin Nasal Spray as needed    Family History  Problem Relation Age of Onset  . Stroke Mother 32       arterial wall of artery in eye   . Endometrial cancer Mother 62  .  Dementia Father   . Stroke Father        "lots of many strokes"  . Aneurysm Father 26       AAA  . Multiple births Maternal Grandmother        died 5 days after delivery of unknown causes  . Colon cancer Maternal Aunt        late 9s  . Cancer Cousin        maternal cousin with an unknown form of cancer  . Breast cancer Neg Hx    Past Surgical History:  Procedure Laterality Date  .  ABDOMINAL HYSTERECTOMY    . BRAIN SURGERY     left cataract  . COLON SURGERY     12/2017  . COLONOSCOPY    . DILATION AND CURETTAGE OF UTERUS     2008-polyp removed  . HAND SURGERY  2006   left thumb surgery with pin  . KNEE ARTHROPLASTY    . KNEE ARTHROSCOPY Left 04/08/2018   Procedure: ARTHROSCOPY LEFT KNEE;  Surgeon: Frederik Pear, MD;  Location: [facility];  Service: Orthopedics;  Laterality: Left;  . lipomas removed from back   40 yrs ago  . MASS EXCISION N/A 10/05/2014   Procedure: EXCISION OF BILATERAL LOWER BACK MASSES;  Surgeon: Coralie Keens, MD;  Location: [facility];  Service: General;  Laterality: N/A;  . MEDIAL PARTIAL KNEE REPLACEMENT  2005   LT knee  . OTHER SURGICAL HISTORY     metal pin/screw to repair arthritis in LT thumb joint  . ROBOTIC ASSISTED TOTAL HYSTERECTOMY WITH BILATERAL SALPINGO OOPHERECTOMY  12/20/2012   Procedure: ROBOTIC ASSISTED TOTAL HYSTERECTOMY WITH BILATERAL SALPINGO OOPHORECTOMY;  Surgeon: Imagene Gurney A. Alycia Rossetti, MD;  [facility];  Service: Gynecology;  Laterality: N/A;  WITH PELVIC PARAAORTIC   . ROBOTIC PELVIC AND PARA-AORTIC LYMPH NODE DISSECTION  12/20/2012   Procedure: ROBOTIC PELVIC AND PARA-AORTIC LYMPH NODE DISSECTION;  Surgeon: Imagene Gurney A. Alycia Rossetti, MD;  [facility];  Service: Gynecology;  Laterality: Bilateral;  . TOTAL KNEE ARTHROPLASTY Right 04/08/2018   Procedure: RIGHT TOTAL KNEE ARTHROPLASTY;  Surgeon: Frederik Pear, MD;  Location: [facility];  Service: Orthopedics;  Laterality: Right;  Right TKA and Left Knee Arthroscopy  . TOTAL KNEE REVISION Left 02/03/2016   Procedure: TOTAL KNEE REVISION;  Surgeon: Frederik Pear, MD;  Location: [facility];  Service: Orthopedics;  Laterality: Left;   Social History   Social History Narrative   Patient does not have any biological children but has step children   Immunization History  Administered Date(s) Administered  . Tdap 04/04/2011     Objective: Vital Signs: There were no vitals taken for  this visit.   Physical Exam   Musculoskeletal Exam: ***  CDAI Exam: CDAI Score: - Patient Global: -; Provider Global: - Swollen: -; Tender: - Joint Exam   No joint exam has been documented for this visit   There is currently no information documented on the homunculus. Go to the Rheumatology activity and complete the homunculus joint exam.  Investigation: No additional findings.  Imaging: No results found.  Recent Labs: Lab Results  Component Value Date   WBC 9.4 04/09/2018   HGB 11.1 (L) 04/09/2018   PLT 184 04/09/2018   NA 142 01/31/2019   K 4.8 01/31/2019   CL 105 01/31/2019   CO2 23 01/31/2019   GLUCOSE 95 01/31/2019   BUN 17 01/31/2019   CREATININE 0.74 01/31/2019   BILITOT 0.3 01/31/2019   ALKPHOS 91 01/31/2019  AST 27 01/31/2019   ALT 30 01/31/2019   PROT 6.4 01/31/2019   ALBUMIN 4.5 01/31/2019   CALCIUM 9.6 01/31/2019   GFRAA 95 01/31/2019    Speciality Comments: No specialty comments available.  Procedures:  No procedures performed Allergies: Cefdinir, Penicillins, Tape, and Penicillamine   Assessment / Plan:     Visit Diagnoses: No diagnosis found.  Orders: No orders of the defined types were placed in this encounter.  No orders of the defined types were placed in this encounter.   Face-to-face time spent with patient was *** minutes. Greater than 50% of time was spent in counseling and coordination of care.  Follow-Up Instructions: No follow-ups on file.   Earnestine Mealing, CMA  Note - This record has been created using Editor, commissioning.  Chart creation errors have been sought, but may not always  have been located. Such creation errors do not reflect on  the standard of medical care.

## 2019-06-20 ENCOUNTER — Ambulatory Visit: Payer: Medicare HMO | Admitting: Rheumatology

## 2019-07-12 NOTE — Progress Notes (Deleted)
Office Visit Note  Patient: Brooke Williams             Date of Birth: Dec 24, 1945           MRN: [id number]             PCP: Stephens Shire, MD Referring: Stephens Shire, MD Visit Date: 07/26/2019 Occupation: @GUAROCC @  Subjective:  No chief complaint on file.   History of Present Illness: Brooke Williams is a 73 y.o. female ***   Activities of Daily Living:  Patient reports morning stiffness for *** {minute/hour:19697}.   Patient {ACTIONS;DENIES/REPORTS:21021675::"Denies"} nocturnal pain.  Difficulty dressing/grooming: {ACTIONS;DENIES/REPORTS:21021675::"Denies"} Difficulty climbing stairs: {ACTIONS;DENIES/REPORTS:21021675::"Denies"} Difficulty getting out of chair: {ACTIONS;DENIES/REPORTS:21021675::"Denies"} Difficulty using hands for taps, buttons, cutlery, and/or writing: {ACTIONS;DENIES/REPORTS:21021675::"Denies"}  No Rheumatology ROS completed.   PMFS History:  Patient Active Problem List   Diagnosis Date Noted  . Status post total bilateral knee replacement 09/28/2018  . Primary osteoarthritis of both feet 09/28/2018  . Knee osteoarthritis 04/09/2018  . Fibrosis of left knee joint 04/09/2018  . Primary osteoarthritis of right knee 04/08/2018  . Osteoarthritis of right knee 04/06/2018  . Primary osteoarthritis of both knees 09/24/2016  . Primary osteoarthritis of both hands 09/24/2016  . DJD (degenerative joint disease), cervical 09/24/2016  . Osteopenia of multiple sites 09/24/2016  . Vitamin D deficiency 09/24/2016  . Positive ANA (antinuclear antibody) 09/24/2016  . Recurrent aphthous stomatitis 02/04/2016  . Benign neoplasm of skin of trunk 02/04/2016  . Loose total knee arthroplasty ([place]) Left 02/01/2016  . Chronic respiratory failure with hypoxia (HCC)/ noct hypoxemia 11/21/2015  . OSA (obstructive sleep apnea) 10/16/2015  . Chronic tension-type headache, not intractable 10/16/2015  . Atypical chest pain 08/28/2015  . Vasomotor flushing 07/05/2014   . Adult hypothyroidism 02/05/2014  . Hyperlipidemia 10/10/2013  . Endometrial ca ([place]) 10/10/2013  . Family history of premature CAD 10/10/2013  . Endometrial adenocarcinoma ([place]) 12/01/2012  . HEMORRHOIDS, INTERNAL 09/11/2010  . Migraine headache 03/19/2010  . ESOPHAGEAL REFLUX 03/19/2010  . FULL INCONTINENCE OF FECES 03/19/2010    Past Medical History:  Diagnosis Date  . Arthritis    arthritis-hands. cervical disc degeneration  . Back pain    6th vertebrae has collapse on 7th,pinched nerve feeling occ  . Dizziness    rarely,takes Meclizine if needed  . Endometrial cancer ([place]) 2014  . GERD (gastroesophageal reflux disease)    controls with Zantac  . Headache(784.0)    headaches frequently but states since placed on 2L/m via N/C headaches have improved  . History of colon polyps    benign  . History of staph infection   . Hyperlipidemia    takes krill oil daily  . Hypothyroidism    takes Synthroid daily  . Ileus ([place]) 10/2015   was treated  . Joint pain   . Joint swelling   . Lupus ([place])    remission since 2008  . Nocturia   . OSA (obstructive sleep apnea)    sleep study in epic from 11-01-15.Uses Oxygen  . Pneumonia    hx of-within the last 15 yrs  . PONV (postoperative nausea and vomiting)   . Restless leg syndrome    takes Mirapex nightly  . Seasonal allergies    takes Allegra daily and uses Astelin Nasal Spray as needed    Family History  Problem Relation Age of Onset  . Stroke Mother 25       arterial wall of artery in eye   . Endometrial cancer Mother 2  .  Dementia Father   . Stroke Father        "lots of many strokes"  . Aneurysm Father 74       AAA  . Multiple births Maternal Grandmother        died 5 days after delivery of unknown causes  . Colon cancer Maternal Aunt        late 29s  . Cancer Cousin        maternal cousin with an unknown form of cancer  . Breast cancer Neg Hx    Past Surgical History:  Procedure Laterality Date  .  ABDOMINAL HYSTERECTOMY    . BRAIN SURGERY     left cataract  . COLON SURGERY     12/2017  . COLONOSCOPY    . DILATION AND CURETTAGE OF UTERUS     2008-polyp removed  . HAND SURGERY  2006   left thumb surgery with pin  . KNEE ARTHROPLASTY    . KNEE ARTHROSCOPY Left 04/08/2018   Procedure: ARTHROSCOPY LEFT KNEE;  Surgeon: Frederik Pear, MD;  Location: [facility];  Service: Orthopedics;  Laterality: Left;  . lipomas removed from back   40 yrs ago  . MASS EXCISION N/A 10/05/2014   Procedure: EXCISION OF BILATERAL LOWER BACK MASSES;  Surgeon: Coralie Keens, MD;  Location: [facility];  Service: General;  Laterality: N/A;  . MEDIAL PARTIAL KNEE REPLACEMENT  2005   LT knee  . OTHER SURGICAL HISTORY     metal pin/screw to repair arthritis in LT thumb joint  . ROBOTIC ASSISTED TOTAL HYSTERECTOMY WITH BILATERAL SALPINGO OOPHERECTOMY  12/20/2012   Procedure: ROBOTIC ASSISTED TOTAL HYSTERECTOMY WITH BILATERAL SALPINGO OOPHORECTOMY;  Surgeon: Imagene Gurney A. Alycia Rossetti, MD;  Location: [facility];  Service: Gynecology;  Laterality: N/A;  WITH PELVIC PARAAORTIC   . ROBOTIC PELVIC AND PARA-AORTIC LYMPH NODE DISSECTION  12/20/2012   Procedure: ROBOTIC PELVIC AND PARA-AORTIC LYMPH NODE DISSECTION;  Surgeon: Imagene Gurney A. Alycia Rossetti, MD;  Location: [facility];  Service: Gynecology;  Laterality: Bilateral;  . TOTAL KNEE ARTHROPLASTY Right 04/08/2018   Procedure: RIGHT TOTAL KNEE ARTHROPLASTY;  Surgeon: Frederik Pear, MD;  Location: [facility];  Service: Orthopedics;  Laterality: Right;  Right TKA and Left Knee Arthroscopy  . TOTAL KNEE REVISION Left 02/03/2016   Procedure: TOTAL KNEE REVISION;  Surgeon: Frederik Pear, MD;  Location: [facility];  Service: Orthopedics;  Laterality: Left;   Social History   Social History Narrative   Patient does not have any biological children but has step children   Immunization History  Administered Date(s) Administered  . Tdap 04/04/2011     Objective: Vital Signs: There were no vitals taken for  this visit.   Physical Exam   Musculoskeletal Exam: ***  CDAI Exam: CDAI Score: - Patient Global: -; Provider Global: - Swollen: -; Tender: - Joint Exam   No joint exam has been documented for this visit   There is currently no information documented on the homunculus. Go to the Rheumatology activity and complete the homunculus joint exam.  Investigation: No additional findings.  Imaging: No results found.  Recent Labs: Lab Results  Component Value Date   WBC 9.4 04/09/2018   HGB 11.1 (L) 04/09/2018   PLT 184 04/09/2018   NA 142 01/31/2019   K 4.8 01/31/2019   CL 105 01/31/2019   CO2 23 01/31/2019   GLUCOSE 95 01/31/2019   BUN 17 01/31/2019   CREATININE 0.74 01/31/2019   BILITOT 0.3 01/31/2019   ALKPHOS 91 01/31/2019  AST 27 01/31/2019   ALT 30 01/31/2019   PROT 6.4 01/31/2019   ALBUMIN 4.5 01/31/2019   CALCIUM 9.6 01/31/2019   GFRAA 95 01/31/2019    Speciality Comments: No specialty comments available.  Procedures:  No procedures performed Allergies: Cefdinir, Penicillins, Tape, and Penicillamine   Assessment / Plan:     Visit Diagnoses: No diagnosis found.  Orders: No orders of the defined types were placed in this encounter.  No orders of the defined types were placed in this encounter.   Face-to-face time spent with patient was *** minutes. Greater than 50% of time was spent in counseling and coordination of care.  Follow-Up Instructions: No follow-ups on file.   Ofilia Neas, PA-C  Note - This record has been created using Dragon software.  Chart creation errors have been sought, but may not always  have been located. Such creation errors do not reflect on  the standard of medical care.

## 2019-07-26 ENCOUNTER — Ambulatory Visit: Payer: Medicare HMO | Admitting: Rheumatology

## 2019-08-14 NOTE — Progress Notes (Signed)
Office Visit Note  Patient: Brooke Williams             Date of Birth: February 01, 1946           MRN: [id number]             PCP: Stephens Shire, MD Referring: Stephens Shire, MD Visit Date: 08/28/2019 Occupation: @GUAROCC @  Subjective:  Neck and back pain.   History of Present Illness: Brooke Williams is a 73 y.o. female with history of osteoarthritis, degenerative disc disease and positive ANA.  She states she continues to have discomfort in the cervical area which she describes mostly in the left trapezius area.  She also had some midthoracic pain.  She had recent CT scan of her abdomen which was stable.  She denies any history of joint swelling.  Activities of Daily Living:  Patient reports morning stiffness for 5 minutes.   Patient Reports nocturnal pain.  Difficulty dressing/grooming: Denies Difficulty climbing stairs: Denies Difficulty getting out of chair: Denies Difficulty using hands for taps, buttons, cutlery, and/or writing: Denies  Review of Systems  Constitutional: Positive for fatigue. Negative for night sweats, weight gain and weight loss.  HENT: Positive for mouth dryness and nose dryness. Negative for mouth sores, trouble swallowing and trouble swallowing.   Eyes: Positive for dryness. Negative for pain, redness and visual disturbance.  Respiratory: Negative for cough, shortness of breath, wheezing and difficulty breathing.   Cardiovascular: Negative for chest pain, palpitations, hypertension, irregular heartbeat and swelling in legs/feet.  Gastrointestinal: Negative for blood in stool, constipation, diarrhea, nausea and vomiting.  Endocrine: Negative for increased urination.  Genitourinary: Negative for difficulty urinating, painful urination and vaginal dryness.  Musculoskeletal: Positive for arthralgias, joint pain and morning stiffness. Negative for joint swelling, myalgias, muscle weakness, muscle tenderness and myalgias.  Skin: Negative for color change,  rash, hair loss, skin tightness, ulcers and sensitivity to sunlight.  Allergic/Immunologic: Negative for susceptible to infections.  Neurological: Negative for dizziness, headaches, memory loss, night sweats and weakness.  Hematological: Negative for bruising/bleeding tendency and swollen glands.  Psychiatric/Behavioral: Positive for sleep disturbance. Negative for depressed mood and confusion. The patient is not nervous/anxious.     PMFS History:  Patient Active Problem List   Diagnosis Date Noted  . Status post total bilateral knee replacement 09/28/2018  . Primary osteoarthritis of both feet 09/28/2018  . Knee osteoarthritis 04/09/2018  . Fibrosis of left knee joint 04/09/2018  . Primary osteoarthritis of right knee 04/08/2018  . Osteoarthritis of right knee 04/06/2018  . Primary osteoarthritis of both knees 09/24/2016  . Primary osteoarthritis of both hands 09/24/2016  . DJD (degenerative joint disease), cervical 09/24/2016  . Osteopenia of multiple sites 09/24/2016  . Vitamin D deficiency 09/24/2016  . Positive ANA (antinuclear antibody) 09/24/2016  . Recurrent aphthous stomatitis 02/04/2016  . Benign neoplasm of skin of trunk 02/04/2016  . Loose total knee arthroplasty ([place]) Left 02/01/2016  . Chronic respiratory failure with hypoxia (HCC)/ noct hypoxemia 11/21/2015  . OSA (obstructive sleep apnea) 10/16/2015  . Chronic tension-type headache, not intractable 10/16/2015  . Atypical chest pain 08/28/2015  . Vasomotor flushing 07/05/2014  . Adult hypothyroidism 02/05/2014  . Hyperlipidemia 10/10/2013  . Endometrial ca ([place]) 10/10/2013  . Family history of premature CAD 10/10/2013  . Endometrial adenocarcinoma ([place]) 12/01/2012  . HEMORRHOIDS, INTERNAL 09/11/2010  . Migraine headache 03/19/2010  . ESOPHAGEAL REFLUX 03/19/2010  . FULL INCONTINENCE OF FECES 03/19/2010    Past Medical History:  Diagnosis Date  .  Arthritis    arthritis-hands. cervical disc degeneration  .  Back pain    6th vertebrae has collapse on 7th,pinched nerve feeling occ  . Dizziness    rarely,takes Meclizine if needed  . Endometrial cancer ([place]) 2014  . GERD (gastroesophageal reflux disease)    controls with Zantac  . Headache(784.0)    headaches frequently but states since placed on 2L/m via N/C headaches have improved  . History of colon polyps    benign  . History of staph infection   . Hyperlipidemia    takes krill oil daily  . Hypothyroidism    takes Synthroid daily  . Ileus ([place]) 10/2015   was treated  . Joint pain   . Joint swelling   . Lupus ([place])    remission since 2008  . Nocturia   . OSA (obstructive sleep apnea)    sleep study in epic from 11-01-15.Uses Oxygen  . Pneumonia    hx of-within the last 15 yrs  . PONV (postoperative nausea and vomiting)   . Restless leg syndrome    takes Mirapex nightly  . Seasonal allergies    takes Allegra daily and uses Astelin Nasal Spray as needed    Family History  Problem Relation Age of Onset  . Stroke Mother 20       arterial wall of artery in eye   . Endometrial cancer Mother 46  . Dementia Father   . Stroke Father        "lots of many strokes"  . Aneurysm Father 56       AAA  . Multiple births Maternal Grandmother        died 5 days after delivery of unknown causes  . Colon cancer Maternal Aunt        late 23s  . Cancer Cousin        maternal cousin with an unknown form of cancer  . Breast cancer Neg Hx    Past Surgical History:  Procedure Laterality Date  . ABDOMINAL HYSTERECTOMY    . BRAIN SURGERY     left cataract  . COLON SURGERY     12/2017  . COLONOSCOPY    . DILATION AND CURETTAGE OF UTERUS     2008-polyp removed  . HAND SURGERY  2006   left thumb surgery with pin  . KNEE ARTHROPLASTY    . KNEE ARTHROSCOPY Left 04/08/2018   Procedure: ARTHROSCOPY LEFT KNEE;  Surgeon: Frederik Pear, MD;  Location: [facility];  Service: Orthopedics;  Laterality: Left;  . lipomas removed from back   40 yrs ago  .  MASS EXCISION N/A 10/05/2014   Procedure: EXCISION OF BILATERAL LOWER BACK MASSES;  Surgeon: Coralie Keens, MD;  Location: [facility];  Service: General;  Laterality: N/A;  . MEDIAL PARTIAL KNEE REPLACEMENT  2005   LT knee  . OTHER SURGICAL HISTORY     metal pin/screw to repair arthritis in LT thumb joint  . ROBOTIC ASSISTED TOTAL HYSTERECTOMY WITH BILATERAL SALPINGO OOPHERECTOMY  12/20/2012   Procedure: ROBOTIC ASSISTED TOTAL HYSTERECTOMY WITH BILATERAL SALPINGO OOPHORECTOMY;  Surgeon: Imagene Gurney A. Alycia Rossetti, MD;  [facility];  Service: Gynecology;  Laterality: N/A;  WITH PELVIC PARAAORTIC   . ROBOTIC PELVIC AND PARA-AORTIC LYMPH NODE DISSECTION  12/20/2012   Procedure: ROBOTIC PELVIC AND PARA-AORTIC LYMPH NODE DISSECTION;  Surgeon: Imagene Gurney A. Alycia Rossetti, MD;  [facility];  Service: Gynecology;  Laterality: Bilateral;  . TOTAL KNEE ARTHROPLASTY Right 04/08/2018   Procedure: RIGHT TOTAL KNEE  ARTHROPLASTY;  Surgeon: Frederik Pear, MD;  Location: [facility];  Service: Orthopedics;  Laterality: Right;  Right TKA and Left Knee Arthroscopy  . TOTAL KNEE REVISION Left 02/03/2016   Procedure: TOTAL KNEE REVISION;  Surgeon: Frederik Pear, MD;  Location: [facility];  Service: Orthopedics;  Laterality: Left;  . WRIST SURGERY Left 06/07/2019   Dr. [place] Silos    Social History   Social History Narrative   Patient does not have any biological children but has step children   Immunization History  Administered Date(s) Administered  . Tdap 04/04/2011     Objective: Vital Signs: BP 113/76 (BP Location: Right Arm, Patient Position: Sitting, Cuff Size: Normal)   Pulse 83   Resp 13   Ht 5\' 7"  (1.702 m)   Wt 168 lb 6.4 oz (76.4 kg)   BMI 26.38 kg/m    Physical Exam Vitals signs and nursing note reviewed.  Constitutional:      Appearance: She is well-developed.  HENT:     Head: Normocephalic and atraumatic.  Eyes:     Conjunctiva/sclera: Conjunctivae normal.  Neck:     Musculoskeletal: Normal range  of motion.  Cardiovascular:     Rate and Rhythm: Normal rate and regular rhythm.     Heart sounds: Normal heart sounds.  Pulmonary:     Effort: Pulmonary effort is normal.     Breath sounds: Normal breath sounds.  Abdominal:     General: Bowel sounds are normal.     Palpations: Abdomen is soft.  Lymphadenopathy:     Cervical: No cervical adenopathy.  Skin:    General: Skin is warm and dry.     Capillary Refill: Capillary refill takes less than 2 seconds.  Neurological:     Mental Status: She is alert and oriented to person, place, and time.  Psychiatric:        Behavior: Behavior normal.      Musculoskeletal Exam: Patient had good range of motion of her cervical spine with left trapezius spasm.  She has mild thoracic kyphosis with midthoracic tenderness.  Lumbar spine had limited range of motion.  Shoulder joints, elbow joints with good range of motion.  She has PIP and DIP thickening in her hands and feet consistent with osteoarthritis.  Hip joints with good range of motion.  She has bilateral total knee replacement with limited extension.  CDAI Exam: CDAI Score: - Patient Global: -; Provider Global: - Swollen: -; Tender: - Joint Exam   No joint exam has been documented for this visit   There is currently no information documented on the homunculus. Go to the Rheumatology activity and complete the homunculus joint exam.  Investigation: No additional findings.  Imaging: No results found.  Recent Labs: Lab Results  Component Value Date   WBC 9.4 04/09/2018   HGB 11.1 (L) 04/09/2018   PLT 184 04/09/2018   NA 142 01/31/2019   K 4.8 01/31/2019   CL 105 01/31/2019   CO2 23 01/31/2019   GLUCOSE 95 01/31/2019   BUN 17 01/31/2019   CREATININE 0.74 01/31/2019   BILITOT 0.3 01/31/2019   ALKPHOS 91 01/31/2019   AST 27 01/31/2019   ALT 30 01/31/2019   PROT 6.4 01/31/2019   ALBUMIN 4.5 01/31/2019   CALCIUM 9.6 01/31/2019   GFRAA 95 01/31/2019    Speciality Comments:  No specialty comments available.  Procedures:  No procedures performed Allergies: Cefdinir, Penicillins, Tape, and Penicillamine   Assessment / Plan:     Visit Diagnoses: Primary osteoarthritis  of both hands-joint protection muscle strengthening was discussed.  Trochanteric bursitis of right hip-she currently have mild discomfort.  Status post total bilateral knee replacement - March 2017 left, 05/19 right by Dr. Mayer Camel.  She has limited extension but not discomfort.  Primary osteoarthritis of both feet-proper fitting shoes were discussed.  DDD (degenerative disc disease), cervical-she has chronic cervical discomfort.  The pain is mostly in the left trapezius region.  I offered trigger point injection which she declined.  Have given her a handout on C-spine exercises.  She was also having some thoracic discomfort.  I have given her a handout on back exercises.  Osteopenia of multiple sites-I reviewed her bone density today.  It was consistent with osteopenia.  Use of calcium, vitamin D and resistive exercises was discussed.  Positive ANA (antinuclear antibody) - history of, negative later.  She has no clinical features of autoimmune disease.  History of endometrial cancer-she had recent CT scan of her abdomen which she brought for review.  History of hyperlipidemia  History of hypothyroidism  History of vitamin D deficiency  History of sleep apnea - uses oxygen at night  History of migraine  Orders: No orders of the defined types were placed in this encounter.  No orders of the defined types were placed in this encounter.   Face-to-face time spent with patient was 25 minutes. Greater than 50% of time was spent in counseling and coordination of care.  Follow-Up Instructions: Return in about 6 months (around 02/25/2020) for Osteoarthritis.   Bo Merino, MD  Note - This record has been created using Editor, commissioning.  Chart creation errors have been sought, but may not  always  have been located. Such creation errors do not reflect on  the standard of medical care.

## 2019-08-28 ENCOUNTER — Other Ambulatory Visit: Payer: Self-pay

## 2019-08-28 ENCOUNTER — Encounter: Payer: Self-pay | Admitting: Rheumatology

## 2019-08-28 ENCOUNTER — Ambulatory Visit (INDEPENDENT_AMBULATORY_CARE_PROVIDER_SITE_OTHER): Payer: Medicare HMO | Admitting: Rheumatology

## 2019-08-28 VITALS — BP 113/76 | HR 83 | Resp 13 | Ht 67.0 in | Wt 168.4 lb

## 2019-08-28 DIAGNOSIS — M7061 Trochanteric bursitis, right hip: Secondary | ICD-10-CM | POA: Diagnosis not present

## 2019-08-28 DIAGNOSIS — Z8542 Personal history of malignant neoplasm of other parts of uterus: Secondary | ICD-10-CM

## 2019-08-28 DIAGNOSIS — M19071 Primary osteoarthritis, right ankle and foot: Secondary | ICD-10-CM

## 2019-08-28 DIAGNOSIS — M19072 Primary osteoarthritis, left ankle and foot: Secondary | ICD-10-CM

## 2019-08-28 DIAGNOSIS — M5030 Other cervical disc degeneration, unspecified cervical region: Secondary | ICD-10-CM

## 2019-08-28 DIAGNOSIS — Z96653 Presence of artificial knee joint, bilateral: Secondary | ICD-10-CM | POA: Diagnosis not present

## 2019-08-28 DIAGNOSIS — M8589 Other specified disorders of bone density and structure, multiple sites: Secondary | ICD-10-CM

## 2019-08-28 DIAGNOSIS — M19041 Primary osteoarthritis, right hand: Secondary | ICD-10-CM | POA: Diagnosis not present

## 2019-08-28 DIAGNOSIS — Z8639 Personal history of other endocrine, nutritional and metabolic disease: Secondary | ICD-10-CM

## 2019-08-28 DIAGNOSIS — Z8669 Personal history of other diseases of the nervous system and sense organs: Secondary | ICD-10-CM

## 2019-08-28 DIAGNOSIS — M19042 Primary osteoarthritis, left hand: Secondary | ICD-10-CM

## 2019-08-28 DIAGNOSIS — R768 Other specified abnormal immunological findings in serum: Secondary | ICD-10-CM

## 2019-08-28 NOTE — Patient Instructions (Signed)
Cervical Strain and Sprain Rehab Ask your health care provider which exercises are safe for you. Do exercises exactly as told by your health care provider and adjust them as directed. It is normal to feel mild stretching, pulling, tightness, or discomfort as you do these exercises. Stop right away if you feel sudden pain or your pain gets worse. Do not begin these exercises until told by your health care provider. Stretching and range-of-motion exercises Cervical side bending  1. Using good posture, sit on a stable chair or stand up. 2. Without moving your shoulders, slowly tilt your left / right ear to your shoulder until you feel a stretch in the opposite side neck muscles. You should be looking straight ahead. 3. Hold for __________ seconds. 4. Repeat with the other side of your neck. Repeat __________ times. Complete this exercise __________ times a day. Cervical rotation  1. Using good posture, sit on a stable chair or stand up. 2. Slowly turn your head to the side as if you are looking over your left / right shoulder. ? Keep your eyes level with the ground. ? Stop when you feel a stretch along the side and the back of your neck. 3. Hold for __________ seconds. 4. Repeat this by turning to your other side. Repeat __________ times. Complete this exercise __________ times a day. Thoracic extension and pectoral stretch 1. Roll a towel or a small blanket so it is about 4 inches (10 cm) in diameter. 2. Lie down on your back on a firm surface. 3. Put the towel lengthwise, under your spine in the middle of your back. It should not be under your shoulder blades. The towel should line up with your spine from your middle back to your lower back. 4. Put your hands behind your head and let your elbows fall out to your sides. 5. Hold for __________ seconds. Repeat __________ times. Complete this exercise __________ times a day. Strengthening exercises Isometric upper cervical flexion 1. Lie on  your back with a thin pillow behind your head and a small rolled-up towel under your neck. 2. Gently tuck your chin toward your chest and nod your head down to look toward your feet. Do not lift your head off the pillow. 3. Hold for __________ seconds. 4. Release the tension slowly. Relax your neck muscles completely before you repeat this exercise. Repeat __________ times. Complete this exercise __________ times a day. Isometric cervical extension  1. Stand about 6 inches (15 cm) away from a wall, with your back facing the wall. 2. Place a soft object, about 6-8 inches (15-20 cm) in diameter, between the back of your head and the wall. A soft object could be a small pillow, a ball, or a folded towel. 3. Gently tilt your head back and press into the soft object. Keep your jaw and forehead relaxed. 4. Hold for __________ seconds. 5. Release the tension slowly. Relax your neck muscles completely before you repeat this exercise. Repeat __________ times. Complete this exercise __________ times a day. Posture and body mechanics Body mechanics refers to the movements and positions of your body while you do your daily activities. Posture is part of body mechanics. Good posture and healthy body mechanics can help to relieve stress in your body's tissues and joints. Good posture means that your spine is in its natural S-curve position (your spine is neutral), your shoulders are pulled back slightly, and your head is not tipped forward. The following are general guidelines for applying improved   posture and body mechanics to your everyday activities. Sitting  1. When sitting, keep your spine neutral and keep your feet flat on the floor. Use a footrest, if necessary, and keep your thighs parallel to the floor. Avoid rounding your shoulders, and avoid tilting your head forward. 2. When working at a desk or a computer, keep your desk at a height where your hands are slightly lower than your elbows. Slide your  chair under your desk so you are close enough to maintain good posture. 3. When working at a computer, place your monitor at a height where you are looking straight ahead and you do not have to tilt your head forward or downward to look at the screen. Standing   When standing, keep your spine neutral and keep your feet about hip-width apart. Keep a slight bend in your knees. Your ears, shoulders, and hips should line up.  When you do a task in which you stand in one place for a long time, place one foot up on a stable object that is 2-4 inches (5-10 cm) high, such as a footstool. This helps keep your spine neutral. Resting When lying down and resting, avoid positions that are most painful for you. Try to support your neck in a neutral position. You can use a contour pillow or a small rolled-up towel. Your pillow should support your neck but not push on it. This information is not intended to replace advice given to you by your health care provider. Make sure you discuss any questions you have with your health care provider. Document Released: 11/16/2005 Document Revised: 03/08/2019 Document Reviewed: 08/17/2018 [employer] Patient Education  2020 [employer]. Back Exercises The following exercises strengthen the muscles that help to support the trunk and back. They also help to keep the lower back flexible. Doing these exercises can help to prevent back pain or lessen existing pain.  If you have back pain or discomfort, try doing these exercises 2-3 times each day or as told by your health care provider.  As your pain improves, do them once each day, but increase the number of times that you repeat the steps for each exercise (do more repetitions).  To prevent the recurrence of back pain, continue to do these exercises once each day or as told by your health care provider. Do exercises exactly as told by your health care provider and adjust them as directed. It is normal to feel mild stretching,  pulling, tightness, or discomfort as you do these exercises, but you should stop right away if you feel sudden pain or your pain gets worse. Exercises Single knee to chest Repeat these steps 3-5 times for each leg: 1. Lie on your back on a firm bed or the floor with your legs extended. 2. Bring one knee to your chest. Your other leg should stay extended and in contact with the floor. 3. Hold your knee in place by grabbing your knee or thigh with both hands and hold. 4. Pull on your knee until you feel a gentle stretch in your lower back or buttocks. 5. Hold the stretch for 10-30 seconds. 6. Slowly release and straighten your leg. Pelvic tilt Repeat these steps 5-10 times: 1. Lie on your back on a firm bed or the floor with your legs extended. 2. Bend your knees so they are pointing toward the ceiling and your feet are flat on the floor. 3. Tighten your lower abdominal muscles to press your lower back against the floor. This  motion will tilt your pelvis so your tailbone points up toward the ceiling instead of pointing to your feet or the floor. 4. With gentle tension and even breathing, hold this position for 5-10 seconds. Cat-cow Repeat these steps until your lower back becomes more flexible: 1. Get into a hands-and-knees position on a firm surface. Keep your hands under your shoulders, and keep your knees under your hips. You may place padding under your knees for comfort. 2. Let your head hang down toward your chest. Contract your abdominal muscles and point your tailbone toward the floor so your lower back becomes rounded like the back of a cat. 3. Hold this position for 5 seconds. 4. Slowly lift your head, let your abdominal muscles relax and point your tailbone up toward the ceiling so your back forms a sagging arch like the back of a cow. 5. Hold this position for 5 seconds.  Press-ups Repeat these steps 5-10 times: 1. Lie on your abdomen (face-down) on the floor. 2. Place your palms  near your head, about shoulder-width apart. 3. Keeping your back as relaxed as possible and keeping your hips on the floor, slowly straighten your arms to raise the top half of your body and lift your shoulders. Do not use your back muscles to raise your upper torso. You may adjust the placement of your hands to make yourself more comfortable. 4. Hold this position for 5 seconds while you keep your back relaxed. 5. Slowly return to lying flat on the floor.  Bridges Repeat these steps 10 times: 1. Lie on your back on a firm surface. 2. Bend your knees so they are pointing toward the ceiling and your feet are flat on the floor. Your arms should be flat at your sides, next to your body. 3. Tighten your buttocks muscles and lift your buttocks off the floor until your waist is at almost the same height as your knees. You should feel the muscles working in your buttocks and the back of your thighs. If you do not feel these muscles, slide your feet 1-2 inches farther away from your buttocks. 4. Hold this position for 3-5 seconds. 5. Slowly lower your hips to the starting position, and allow your buttocks muscles to relax completely. If this exercise is too easy, try doing it with your arms crossed over your chest. Abdominal crunches Repeat these steps 5-10 times: 1. Lie on your back on a firm bed or the floor with your legs extended. 2. Bend your knees so they are pointing toward the ceiling and your feet are flat on the floor. 3. Cross your arms over your chest. 4. Tip your chin slightly toward your chest without bending your neck. 5. Tighten your abdominal muscles and slowly raise your trunk (torso) high enough to lift your shoulder blades a tiny bit off the floor. Avoid raising your torso higher than that because it can put too much stress on your low back and does not help to strengthen your abdominal muscles. 6. Slowly return to your starting position. Back lifts Repeat these steps 5-10 times:  1. Lie on your abdomen (face-down) with your arms at your sides, and rest your forehead on the floor. 2. Tighten the muscles in your legs and your buttocks. 3. Slowly lift your chest off the floor while you keep your hips pressed to the floor. Keep the back of your head in line with the curve in your back. Your eyes should be looking at the floor. 4. Hold this  position for 3-5 seconds. 5. Slowly return to your starting position. Contact a health care provider if:  Your back pain or discomfort gets much worse when you do an exercise.  Your worsening back pain or discomfort does not lessen within 2 hours after you exercise. If you have any of these problems, stop doing these exercises right away. Do not do them again unless your health care provider says that you can. Get help right away if:  You develop sudden, severe back pain. If this happens, stop doing the exercises right away. Do not do them again unless your health care provider says that you can. This information is not intended to replace advice given to you by your health care provider. Make sure you discuss any questions you have with your health care provider. Document Released: 12/24/2004 Document Revised: 03/23/2019 Document Reviewed: 08/18/2018 [employer] Patient Education  2020 [employer].

## 2019-08-29 LAB — COMPREHENSIVE METABOLIC PANEL
ALT: 30 IU/L (ref 0–32)
AST: 28 IU/L (ref 0–40)
Albumin/Globulin Ratio: 2.1 (ref 1.2–2.2)
Albumin: 4.4 g/dL (ref 3.7–4.7)
Alkaline Phosphatase: 103 IU/L (ref 39–117)
BUN/Creatinine Ratio: 19 (ref 12–28)
BUN: 15 mg/dL (ref 8–27)
Bilirubin Total: 0.3 mg/dL (ref 0.0–1.2)
CO2: 23 mmol/L (ref 20–29)
Calcium: 9.6 mg/dL (ref 8.7–10.3)
Chloride: 106 mmol/L (ref 96–106)
Creatinine, Ser: 0.8 mg/dL (ref 0.57–1.00)
GFR calc Af Amer: 86 mL/min/{1.73_m2} (ref >59)
GFR calc non Af Amer: 74 mL/min/{1.73_m2} (ref >59)
Globulin, Total: 2.1 g/dL (ref 1.5–4.5)
Glucose: 96 mg/dL (ref 65–99)
Potassium: 5.1 mmol/L (ref 3.5–5.2)
Sodium: 143 mmol/L (ref 134–144)
Total Protein: 6.5 g/dL (ref 6.0–8.5)

## 2019-08-29 LAB — LIPID PANEL
Chol/HDL Ratio: 3.3 ratio (ref 0.0–4.4)
Cholesterol, Total: 181 mg/dL (ref 100–199)
HDL: 55 mg/dL (ref >39)
LDL Chol Calc (NIH): 104 mg/dL — ABNORMAL HIGH (ref 0–99)
Triglycerides: 127 mg/dL (ref 0–149)
VLDL Cholesterol Cal: 22 mg/dL (ref 5–40)

## 2019-08-29 LAB — CBC
Hematocrit: 42.4 % (ref 34.0–46.6)
Hemoglobin: 14.3 g/dL (ref 11.1–15.9)
MCH: 31.1 pg (ref 26.6–33.0)
MCHC: 33.7 g/dL (ref 31.5–35.7)
MCV: 92 fL (ref 79–97)
Platelets: 239 10*3/uL (ref 150–450)
RBC: 4.6 x10E6/uL (ref 3.77–5.28)
RDW: 13 % (ref 11.7–15.4)
WBC: 7.3 10*3/uL (ref 3.4–10.8)

## 2019-08-29 LAB — TSH: TSH: 2.46 u[IU]/mL (ref 0.450–4.500)

## 2019-08-31 ENCOUNTER — Ambulatory Visit: Payer: Medicare HMO | Admitting: Cardiovascular Disease

## 2019-08-31 ENCOUNTER — Other Ambulatory Visit: Payer: Self-pay

## 2019-08-31 ENCOUNTER — Encounter: Payer: Self-pay | Admitting: Cardiovascular Disease

## 2019-08-31 DIAGNOSIS — Z79899 Other long term (current) drug therapy: Secondary | ICD-10-CM | POA: Diagnosis not present

## 2019-08-31 DIAGNOSIS — G43719 Chronic migraine without aura, intractable, without status migrainosus: Secondary | ICD-10-CM | POA: Diagnosis not present

## 2019-08-31 DIAGNOSIS — E039 Hypothyroidism, unspecified: Secondary | ICD-10-CM

## 2019-08-31 DIAGNOSIS — Z8249 Family history of ischemic heart disease and other diseases of the circulatory system: Secondary | ICD-10-CM

## 2019-08-31 DIAGNOSIS — E785 Hyperlipidemia, unspecified: Secondary | ICD-10-CM

## 2019-08-31 DIAGNOSIS — M329 Systemic lupus erythematosus, unspecified: Secondary | ICD-10-CM

## 2019-08-31 MED ORDER — EZETIMIBE 10 MG PO TABS: 10.0000 mg | ORAL_TABLET | Freq: Every day | ORAL | 3 refills | Status: DC

## 2019-08-31 NOTE — Progress Notes (Signed)
Patient ID: Brooke Williams, female   DOB: [birth year], 73 y.o.   MRN: [id number]     HPI: Brooke Williams is a 73 y.o. female who presents for a 7 month follow-up cardiology evaluation.   [name] has a history of lupus erythematosus, hyperlipidemia, and strong family history for premature coronary disease. 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 In the past I tried initiating therapy with Simcor 500/20 due to her lipid parameters but she was unable to tolerate this. Subsequently, we tried low-dose Crestor but again she stopped it secondary to development of vague myalgias.  She was diagnosed with endometrial cancer and underwent complete hysterectomy in January 2014.  Initially she did not require chemotherapy or radiation.  However, since I last saw her , she develop recurrence in 2015  and required surgery in February 2015.  She received paclitaxel and carboplatin chemotherapy.  She also has had several procedures for removal of lipoma from her lower back.  She is involved in clinical research study at [facility] evaluating the use of metformin for recurrent cancer.  When I  saw her in December 2016 her sister had recently undergone placement of 2 coronary stents and her mother's twin sister had just undergone CABG surgery. She had undergone previous left knee replacement several years ago.  She continues to be followed closely at [facility] and she brought with her recent results of her CT scans where she was found to have a stable right lower lobe 1.1 cm nodule.  There was no evidence for recurrent or metastatic disease in the abdomen or pelvis  I last saw her in May 2019 for preoperative clearance prior to undergoing right knee replacement with Dr. Frederik Pear. A  preoperative clearance  ECG showed small Q waves in 3 and nondiagnostic diminutive Q wave in aVF.  Because of her ECG she was referred to me today prior to undergoing surgery the following day.  I felt she was stable from a cardiac standpoint.  She underwent surgery successfully without cardiovascular compromise.  During that evaluation I noticed that she had had laboratory from her primary physician in November 2018 and her cholesterol was elevated at 226 with an LDL of 148.  She apparently was given a prescription for Crestor 5 mg but she never started this.  She developed the flu in January and then required surgery in February for rectal prolapse.    When I saw her in May 2019 I recommended initiation of Crestor. She started at 5 mg with plans to titrate to 10 as tolerated.  If she was unable to tolerate Crestor then Zetia was to be added and potentially she could be a candidate for PCSK9 inhibition.  She has tolerated Crestor 5 mg but never titrated this to 10 mg.  Most recent lipid studies this past week has shown marked improvement in LDL to 89.  However, she states that she stopped taking Crestor around the time of her surgery but several weeks ago reinstituted this and as result she believes her levels may actually be further improved.  She was recently seen by her primary physician and she is to start Fosamax.  She denies chest pain.  She denies palpitations.   I  saw her in September 2019.  Apparently she  had not started the Crestor initially and waited until completed healing from knee surgery prior to instituting therapy.  I recommended increased activity.  She had repeat laboratory on January 31, 2019.  Total cholesterol was 166 triglycerides had improved from 150 down to 72 HDL was 57 and LDL 95.  She was tolerant she is tolerating Crestor 5 mg but never titrated up to 10 mg.  She denies any palpitations.  She has been on levothyroxine 75 mcg for hypothyroidism.   I last saw her in March 2020.   Since that time, she is been doing well and denies any chest pain or shortness of breath.  She did not tolerate trial of increasing Crestor to 10 mg, and therefore reduced her dose back to 5 mg.  She was still noticing some joint discomfort and as result in early August completely discontinued statin therapy.  She has had issues with headaches.  In the past she has seen Dr. Lajean Manes and is felt to have longstanding migraine headaches.  She sees Dr. Leslie Dales for her systemic lupus.  She underwent repeat laboratory on August 28, 2019.  Renal function was stable.  TSH was normal at 246.  Total cholesterol was 181, triglycerides 127, HDL 55, LDL 104.  She presents for evaluation.  Past Medical History:  Diagnosis Date  . Arthritis    arthritis-hands. cervical disc degeneration  . Back pain    6th vertebrae has collapse on 7th,pinched nerve feeling occ  . Dizziness    rarely,takes Meclizine if needed  . Endometrial cancer ([place]) 2014  . GERD (gastroesophageal reflux disease)    controls with Zantac  . Headache(784.0)    headaches frequently but states since placed on 2L/m via N/C headaches have improved  . History of colon polyps    benign  . History of staph infection   . Hyperlipidemia    takes krill oil daily  . Hypothyroidism    takes Synthroid daily  . Ileus ([place]) 10/2015   was treated  . Joint pain   . Joint swelling   . Lupus ([place])    remission since 2008  . Nocturia   . OSA (obstructive sleep apnea)    sleep study in epic from 11-01-15.Uses Oxygen  . Pneumonia    hx of-within the last 15 yrs  . PONV (postoperative nausea and vomiting)   . Restless leg syndrome    takes Mirapex nightly  . Seasonal allergies    takes Allegra daily and uses Astelin Nasal Spray as needed    Past Surgical History:  Procedure Laterality Date  . ABDOMINAL HYSTERECTOMY    . BRAIN SURGERY     left cataract  . COLON SURGERY     12/2017  . COLONOSCOPY    . DILATION AND CURETTAGE OF UTERUS      2008-polyp removed  . HAND SURGERY  2006   left thumb surgery with pin  . KNEE ARTHROPLASTY    . KNEE ARTHROSCOPY Left 04/08/2018   Procedure: ARTHROSCOPY LEFT KNEE;  Surgeon: Frederik Pear, MD;  Location: [place];  Service: Orthopedics;  Laterality: Left;  . lipomas removed from back   40 yrs ago  . MASS EXCISION N/A 10/05/2014   Procedure: EXCISION OF BILATERAL LOWER BACK MASSES;  Surgeon: Coralie Keens, MD;  Location: [place];  Service: General;  Laterality: N/A;  . MEDIAL PARTIAL KNEE REPLACEMENT  2005   LT knee  . OTHER SURGICAL HISTORY     metal pin/screw to  repair arthritis in LT thumb joint  . ROBOTIC ASSISTED TOTAL HYSTERECTOMY WITH BILATERAL SALPINGO OOPHERECTOMY  12/20/2012   Procedure: ROBOTIC ASSISTED TOTAL HYSTERECTOMY WITH BILATERAL SALPINGO OOPHORECTOMY;  Surgeon: Imagene Gurney A. Alycia Rossetti, MD;  Location: [facility];  Service: Gynecology;  Laterality: N/A;  WITH PELVIC PARAAORTIC   . ROBOTIC PELVIC AND PARA-AORTIC LYMPH NODE DISSECTION  12/20/2012   Procedure: ROBOTIC PELVIC AND PARA-AORTIC LYMPH NODE DISSECTION;  Surgeon: Imagene Gurney A. Alycia Rossetti, MD;  Location: [facility];  Service: Gynecology;  Laterality: Bilateral;  . TOTAL KNEE ARTHROPLASTY Right 04/08/2018   Procedure: RIGHT TOTAL KNEE ARTHROPLASTY;  Surgeon: Frederik Pear, MD;  Location: [place];  Service: Orthopedics;  Laterality: Right;  Right TKA and Left Knee Arthroscopy  . TOTAL KNEE REVISION Left 02/03/2016   Procedure: TOTAL KNEE REVISION;  Surgeon: Frederik Pear, MD;  Location: [place];  Service: Orthopedics;  Laterality: Left;  . WRIST SURGERY Left 06/07/2019   Dr. Grandville Silos     Allergies  Allergen Reactions  . Cefdinir Hives, Itching and Rash  . Penicillins Hives    CHILDHOOD REACTION Has patient had a PCN reaction causing immediate rash, facial/tongue/throat swelling, SOB or lightheadedness with hypotension: #  #  NO  #  #  Has patient had a PCN reaction occurring within the last 10 years:#  #  #  YES  #  #  #   If all of  the above answers are "NO", then may proceed with Cephalosporin use.  . Tape Itching and Rash    Skin turns red Bandaid  . Penicillamine Rash    Current Outpatient Medications  Medication Sig Dispense Refill  . Alpha-Lipoic Acid 600 MG CAPS Take by mouth daily.    . ASTRAGALUS PO Take 1 capsule by mouth 2 (two) times daily.     Marland Kitchen azelastine (ASTELIN) 0.1 % nasal spray Place 2 sprays into both nostrils daily as needed for rhinitis or allergies. Use in each nostril as directed    . BLACK COHOSH PO Take 1 capsule by mouth daily.     Marland Kitchen CALCIUM PO Take 743 mg by mouth daily.    Marland Kitchen doxycycline (VIBRA-TABS) 100 MG tablet Take 1 tablet by mouth daily.    . famotidine (PEPCID) 20 MG tablet Take 20 mg by mouth as needed for heartburn or indigestion.    . fluocinonide gel (LIDEX) 0.05 % APPLY TO AFFECTED AREA twice a day as needed for canker sores.  1  . fluticasone (FLONASE) 50 MCG/ACT nasal spray Place into both nostrils 2 (two) times daily.    Marland Kitchen loratadine-pseudoephedrine (CLARITIN-D 12-HOUR) 5-120 MG tablet Take 1 tablet by mouth daily as needed for allergies.    . Multiple Vitamin (MULTIVITAMIN WITH MINERALS) TABS Take 1 tablet by mouth daily.    . OXYGEN 2lpm with sleep only    . Polyethyl Glycol-Propyl Glycol (SYSTANE OP) Apply 1 drop to eye every 8 (eight) hours as needed (dry eyes).    . Psyllium (METAMUCIL FIBER PO) Take 5 capsules by mouth daily.     Marland Kitchen SYNTHROID 75 MCG tablet Take 75 mcg by mouth daily before breakfast.     . ezetimibe (ZETIA) 10 MG tablet Take 1 tablet (10 mg total) by mouth daily. 90 tablet 3   No current facility-administered medications for this visit.     Social History   Socioeconomic History  . Marital status: Married    Spouse name: Not on file  . Number of children: 0  . Years of education: Not  on file  . Highest education level: Not on file  Occupational History  . Not on file  Social Needs  . Financial resource strain: Not on file  . Food insecurity     Worry: Not on file    Inability: Not on file  . Transportation needs    Medical: Not on file    Non-medical: Not on file  Tobacco Use  . Smoking status: Never Smoker  . Smokeless tobacco: Never Used  Substance and Sexual Activity  . Alcohol use: Yes    Comment: rarely  . Drug use: No  . Sexual activity: Not Currently    Birth control/protection: Post-menopausal  Lifestyle  . Physical activity    Days per week: Not on file    Minutes per session: Not on file  . Stress: Not on file  Relationships  . Social Herbalist on phone: Not on file    Gets together: Not on file    Attends religious service: Not on file    Active member of club or organization: Not on file    Attends meetings of clubs or organizations: Not on file    Relationship status: Not on file  . Intimate partner violence    Fear of current or ex partner: Not on file    Emotionally abused: Not on file    Physically abused: Not on file    Forced sexual activity: Not on file  Other Topics Concern  . Not on file  Social History Narrative   Patient does not have any biological children but has step children    Family History  Problem Relation Age of Onset  . Stroke Mother 63       arterial wall of artery in eye   . Endometrial cancer Mother 37  . Dementia Father   . Stroke Father        "lots of many strokes"  . Aneurysm Father 22       AAA  . Multiple births Maternal Grandmother        died 5 days after delivery of unknown causes  . Colon cancer Maternal Aunt        late 25s  . Cancer Cousin        maternal cousin with an unknown form of cancer  . Breast cancer Neg Hx    Additional social history is that I take care of her husband Mr. Mabell Esguerra. There is no history of ever smoking tobacco. Occasional alcohol use.   ROS General: Negative; No fevers, chills, or night sweats;  HEENT: Negative; No changes in vision or hearing, sinus congestion, difficulty swallowing Pulmonary:  Negative; No cough, wheezing, shortness of breath, hemoptysis Cardiovascular: Negative; No chest pain, presyncope, syncope, palpitations GI: Negative; No nausea, vomiting, diarrhea, or abdominal pain GU: Negative; No dysuria, hematuria, or difficulty voiding Musculoskeletal: status post successful knee surgery Hematologic/Oncology: Positive for endometrial cancer Endocrine: Negative; no heat/cold intolerance; no diabetes Neuro: Positive for history of migraine headaches Skin: Negative; No rashes or skin lesions Psychiatric: Negative; No behavioral problems, depression Sleep: Negative; No snoring, daytime sleepiness, hypersomnolence, bruxism, restless legs, hypnogognic hallucinations, no cataplexy Other comprehensive 14 point system review is negative.  PE BP 125/84   Pulse (!) 102   Temp (!) 97.2 F (36.2 C)   Ht _0  (1.702 m)   Wt 170 lb (77.1 kg)   SpO2 97%   BMI 26.63 kg/m    Repeat blood pressure by me 120/74  Wt Readings from Last 3 Encounters:  08/31/19 170 lb (77.1 kg)  08/28/19 168 lb 6.4 oz (76.4 kg)  04/10/19 173 lb (78.5 kg)   General: Alert, oriented, no distress.  Skin: normal turgor, no rashes, warm and dry HEENT: Normocephalic, atraumatic. Pupils equal round and reactive to light; sclera anicteric; extraocular muscles intact;  Nose without nasal septal hypertrophy Mouth/Parynx benign; Mallinpatti scale 2 Neck: No JVD, no carotid bruits; normal carotid upstroke Lungs: clear to ausculatation and percussion; no wheezing or rales Chest wall: without tenderness to palpitation Heart: PMI not displaced, RRR, s1 s2 normal, 1/6 systolic murmur, no diastolic murmur, no rubs, gallops, thrills, or heaves Abdomen: soft, nontender; no hepatosplenomehaly, BS+; abdominal aorta nontender and not dilated by palpation. Back: no CVA tenderness Pulses 2+ Musculoskeletal: full range of motion, normal strength, no joint deformities Extremities: no clubbing cyanosis or edema,  Homan's sign negative  Neurologic: grossly nonfocal; Cranial nerves grossly wnl Psychologic: Normal mood and affect   ECG (independently read by me): Normal sinus rhythm at 96 bpm.  Nonspecific ST changes.  Normal intervals.  No ectopy.  March 2020 ECG (independently read by me): Sinus rhythm at 85 bpm.  No ectopy.  Normal intervals.  September 2019 ECG (independently read by me): Normal sinus rhythm at 91 bpm.  Normal intervals.  No ectopy.  May 2019 ECG (independently read by me): Normal sinus rhythm at 84 bpm.  Possible borderline left atrial enlargement.  No ectopy. Normal intervals.  No evidence for prior MI.  Q waves in lead III.  Normal R waves in lead II and aVF  September 2016 ECG (independently read by me): Normal sinus rhythm at 74 bpm.  No ectopy.  Normal intervals.  ECG: Sinus rhythm at 69 beats per minute. No ectopy. Normal intervals.  LABS: BMP Latest Ref Rng & Units 08/28/2019 01/31/2019 08/22/2018  Glucose 65 - 99 mg/dL 96 95 89  BUN 8 - 27 mg/dL _0 Creatinine 0.57 - 1.00 mg/dL 0.80 0.74 0.77  BUN/Creat Ratio 12 - _1 Sodium 134 - 144 mmol/L 143 142 143  Potassium 3.5 - 5.2 mmol/L 5.1 4.8 4.8  Chloride 96 - 106 mmol/L 106 105 105  CO2 20 - 29 mmol/L _2 Calcium 8.7 - 10.3 mg/dL 9.6 9.6 10.0   Hepatic Function Latest Ref Rng & Units 08/28/2019 01/31/2019 08/22/2018  Total Protein 6.0 - 8.5 g/dL 6.5 6.4 6.4  Albumin 3.7 - 4.7 g/dL 4.4 4.5 4.4  AST 0 - 40 IU/L _3 ALT 0 - 32 IU/L _4 Alk Phosphatase 39 - 117 IU/L 103 91 97  Total Bilirubin 0.0 - 1.2 mg/dL 0.3 0.3 0.4   CBC Latest Ref Rng & Units 08/28/2019 04/09/2018 03/31/2018  WBC 3.4 - 10.8 x10E3/uL 7.3 9.4 7.8  Hemoglobin 11.1 - 15.9 g/dL 14.3 11.1(L) 14.2  Hematocrit 34.0 - 46.6 % 42.4 34.1(L) 43.2  Platelets 150 - 450 x10E3/uL 239 184 257   Lab Results  Component Value Date   MCV 92 08/28/2019   MCV 92.9 04/09/2018   MCV 92.9 03/31/2018   Lab Results  Component Value Date    TSH 2.460 08/28/2019    No results found for: HGBA1C   Lipid Panel     Component Value Date/Time   CHOL 181 08/28/2019 0922   TRIG 127 08/28/2019 0922   HDL 55 08/28/2019 0922   CHOLHDL 3.3 08/28/2019 0922   CHOLHDL 3.7 09/06/2015  0914   VLDL 32 (H) 09/06/2015 0914   LDLCALC 104 (H) 08/28/2019 6999     RADIOLOGY: No results found.  Prior  2016 NMR lipoprotein: significant increase LDL particle #2251, LDL calculated 152, HDL 54, triglycerides 180, total cholesterol 242, small LDL particle #1427, and insulin resistance coronary increased at 49.   IMPRESSION:  1. Hyperlipidemia LDL goal <70   2. Family history of premature CAD   3. Intractable chronic migraine without aura and without status migrainosus   4. Hypothyroidism, unspecified type   5. Medication management   6. Lupus ([place])     ASSESSMENT AND PLAN: Mrs. Makylah Bossard is a 73 year-old female who has a strong family history for premature coronary artery disease with several family members having undergone cardiovascular interventions.  She has a history of hyperlipidemia and has had issues of statin induced myalgia.  She was able to be reinstituted and tolerated Crestor 5 mg but apparently did not seem to tolerate the increased regimen to 10 mg.  As result subsequently she had reduced her dose back to 5 mg and since August 2020 discontinue therapy altogether.  I have recommended initiation of Zetia 10 mg to her medical regimen which should provide at least 20% reduction in her current total cholesterol particularly with her very strong family history for CAD.  She inquired about omega-3 fatty acids.  I discussed some of the cardiovascular benefits associated with this.  Her triglycerides are not significantly elevated but have suggested over-the-counter omega-3 fatty acid with EPA/DEA as the predominant ingredients.  She has been having issues with headaches which seem to be more prominent than previously.  In the past she  has seen Dr. Erlinda Hong.  I have suggested that she continues to experience headaches that she undergo a follow-up neurologic evaluation.  Remotely an MRI had been normal.  She continues to see Dr. Kingsley Plan for her systemic lupus erythematosus which seems to be stable.  She is on levothyroxine for hypothyroidism and currently is taking 75 mcg daily.  I have recommending repeat laboratory including a chemistry profile lipid studies be obtained in 6 6 months and I will see her in the office for follow-up evaluation.   Time spent: 25 minutes  Troy Sine, MD, [facility]  08/31/2019 8:34 PM

## 2019-08-31 NOTE — Patient Instructions (Addendum)
Medication Instructions:  Start Zetia 10 mg daily. And over the counter omega 3.  If you need a refill on your cardiac medications before your next appointment, please call your pharmacy.   Lab work: CMET, LIPID in 5-6 months- before follow up visit. Please come in anytime to have your labs drawn, no appointment needed.  They are fasting labs, so nothing to eat or drink after midnight.  Lab hours: 8:00-4:00 lunch hours 12:45-1:45  If you have labs (blood work) drawn today and your tests are completely normal, you will receive your results only by: Marland Kitchen MyChart Message (if you have MyChart) OR . A paper copy in the mail If you have any lab test that is abnormal or we need to change your treatment, we will call you to review the results.  Follow-Up: At [facility], you and your health needs are our priority.  As part of our continuing mission to provide you with exceptional heart care, we have created designated Provider Care Teams.  These Care Teams include your primary Cardiologist (physician) and Advanced Practice Providers (APPs -  Physician Assistants and Nurse Practitioners) who all work together to provide you with the care you need, when you need it. You will need a follow up appointment in 6 months.  Please call our office 2 months in advance to schedule this appointment.  You may see Dr.Kelly or one of the following Advanced Practice Providers on your designated Care Team: Almyra Deforest, [place] . Fabian Sharp, PA-C

## 2019-10-12 ENCOUNTER — Encounter: Payer: Self-pay | Admitting: Genetic Counselor

## 2019-10-12 DIAGNOSIS — Z1379 Encounter for other screening for genetic and chromosomal anomalies: Secondary | ICD-10-CM | POA: Insufficient documentation

## 2020-02-12 ENCOUNTER — Other Ambulatory Visit: Payer: Self-pay

## 2020-02-12 ENCOUNTER — Other Ambulatory Visit (INDEPENDENT_AMBULATORY_CARE_PROVIDER_SITE_OTHER): Payer: Medicare HMO

## 2020-02-12 ENCOUNTER — Encounter: Payer: Self-pay | Admitting: Neurology

## 2020-02-12 ENCOUNTER — Ambulatory Visit: Payer: Medicare HMO | Admitting: Neurology

## 2020-02-12 VITALS — BP 116/73 | HR 92 | Ht 67.0 in | Wt 169.0 lb

## 2020-02-12 DIAGNOSIS — H532 Diplopia: Secondary | ICD-10-CM

## 2020-02-12 DIAGNOSIS — R519 Headache, unspecified: Secondary | ICD-10-CM

## 2020-02-12 NOTE — Progress Notes (Signed)
[facility] Clinic Note - Initial Visit   Date: 02/12/20  Brooke Williams MRN: [id number] DOB: 14-May-1946   Dear Dr. Delman Cheadle:  Thank you for your kind referral of Brooke Williams for consultation of diplopia. Although her history is well known to you, please allow [place] to reiterate it for the purpose of our medical record. The patient was accompanied to the clinic by self.    History of Present Illness: Brooke Williams is a 74 y.o. right-handed female with lupus, endometrial cancer s/p hysterectomy and chemotherapy, nocturnal hypoxemia presenting for evaluation of intermittent diplopia.   In October, she was having eye irritation and redness and treated with eye drops, which helped, but then she developed symptoms again in December. In addition to eye irritation, she began having double vision when she watches TV.  Double vision is side-by-side and always in the evening. She does not notice double vision during the day time or with any other activities, such as using her computer.  She denies droopy eyelids, difficulty swallowing/talking, or limb weakness.   She complains tension headaches for the past year, which is constant.  Pain is worse in the morning and improves some as they day continues.  Coughing and sneezing does not exacerbate her pain.  She has been diagnosed with nocturnal hypoxemia and was on night oxygen for several years, but repeat study in the summer of 2019 was normal and oxygen was stopped.  Since this time, she has noticed that morning headaches are much worse.    Out-side paper records, electronic medical record, and images have been reviewed where available and summarized as:  Lab Results  Component Value Date   TSH 2.460 08/28/2019    Past Medical History:  Diagnosis Date  . Arthritis    arthritis-hands. cervical disc degeneration  . Back pain    6th vertebrae has collapse on 7th,pinched nerve feeling occ  . Dizziness    rarely,takes Meclizine if needed  . Endometrial cancer ([place]) 2014  . GERD (gastroesophageal reflux disease)    controls with Zantac  . Headache(784.0)    headaches frequently but states since placed on 2L/m via N/C headaches have improved  . History of colon polyps    benign  . History of staph infection   . Hyperlipidemia    takes krill oil daily  . Hypothyroidism    takes Synthroid daily  . Ileus ([place]) 10/2015   was treated  . Joint pain   . Joint swelling   . Lupus ([place])    remission since 2008  . Nocturia   . OSA (obstructive sleep apnea)    sleep study in epic from 11-01-15.Uses Oxygen  . Pneumonia    hx of-within the last 15 yrs  . PONV (postoperative nausea and vomiting)   . Restless leg syndrome    takes Mirapex nightly  . Seasonal allergies    takes Allegra daily and uses Astelin Nasal Spray as needed    Past Surgical History:  Procedure Laterality Date  . ABDOMINAL HYSTERECTOMY    . BRAIN SURGERY     left cataract  . COLON SURGERY     12/2017  . COLONOSCOPY    . DILATION AND CURETTAGE OF UTERUS     2008-polyp removed  . HAND SURGERY  2006   left thumb surgery with pin  . KNEE ARTHROPLASTY    . KNEE ARTHROSCOPY Left 04/08/2018   Procedure: ARTHROSCOPY LEFT KNEE;  Surgeon: Frederik Pear, MD;  Location: [facility];  Service:  Orthopedics;  Laterality: Left;  . lipomas removed from back   40 yrs ago  . MASS EXCISION N/A 10/05/2014   Procedure: EXCISION OF BILATERAL LOWER BACK MASSES;  Surgeon: Coralie Keens, MD;  Location: [facility];  Service: General;  Laterality: N/A;  . MEDIAL PARTIAL KNEE REPLACEMENT  2005   LT knee  . OTHER SURGICAL HISTORY     metal pin/screw to repair arthritis in LT thumb joint  . ROBOTIC ASSISTED TOTAL HYSTERECTOMY WITH BILATERAL SALPINGO OOPHERECTOMY  12/20/2012   Procedure: ROBOTIC ASSISTED TOTAL HYSTERECTOMY WITH BILATERAL SALPINGO OOPHORECTOMY;  Surgeon: Imagene Gurney A. Alycia Rossetti, MD;  Location: [facility];  Service: Gynecology;   Laterality: N/A;  WITH PELVIC PARAAORTIC   . ROBOTIC PELVIC AND PARA-AORTIC LYMPH NODE DISSECTION  12/20/2012   Procedure: ROBOTIC PELVIC AND PARA-AORTIC LYMPH NODE DISSECTION;  Surgeon: Imagene Gurney A. Alycia Rossetti, MD;  Location: [facility];  Service: Gynecology;  Laterality: Bilateral;  . TOTAL KNEE ARTHROPLASTY Right 04/08/2018   Procedure: RIGHT TOTAL KNEE ARTHROPLASTY;  Surgeon: Frederik Pear, MD;  Location: [facility];  Service: Orthopedics;  Laterality: Right;  Right TKA and Left Knee Arthroscopy  . TOTAL KNEE REVISION Left 02/03/2016   Procedure: TOTAL KNEE REVISION;  Surgeon: Frederik Pear, MD;  Location: [facility];  Service: Orthopedics;  Laterality: Left;  . WRIST SURGERY Left 06/07/2019   Dr. Grandville Silos      Medications:  Outpatient Encounter Medications as of 02/12/2020  Medication Sig  . Alpha-Lipoic Acid 600 MG CAPS Take by mouth daily.  . ASTRAGALUS PO Take 1 capsule by mouth 2 (two) times daily.   Marland Kitchen azelastine (ASTELIN) 0.1 % nasal spray Place 2 sprays into both nostrils daily as needed for rhinitis or allergies. Use in each nostril as directed  . BLACK COHOSH PO Take 1 capsule by mouth daily.   Marland Kitchen CALCIUM PO Take 743 mg by mouth daily.  Marland Kitchen doxycycline (VIBRA-TABS) 100 MG tablet Take 1 tablet by mouth daily.  Marland Kitchen ezetimibe (ZETIA) 10 MG tablet Take 1 tablet (10 mg total) by mouth daily.  . famotidine (PEPCID) 20 MG tablet Take 20 mg by mouth as needed for heartburn or indigestion.  . fluocinonide gel (LIDEX) 0.05 % APPLY TO AFFECTED AREA twice a day as needed for canker sores.  . fluticasone (FLONASE) 50 MCG/ACT nasal spray Place into both nostrils 2 (two) times daily.  Marland Kitchen loratadine-pseudoephedrine (CLARITIN-D 12-HOUR) 5-120 MG tablet Take 1 tablet by mouth daily as needed for allergies.  . Multiple Vitamin (MULTIVITAMIN WITH MINERALS) TABS Take 1 tablet by mouth daily.  . OXYGEN 2lpm with sleep only  . Polyethyl Glycol-Propyl Glycol (SYSTANE OP) Apply 1 drop to eye every 8 (eight) hours as needed (dry eyes).   . Psyllium (METAMUCIL FIBER PO) Take 5 capsules by mouth daily.   Marland Kitchen SYNTHROID 75 MCG tablet Take 75 mcg by mouth daily before breakfast.    No facility-administered encounter medications on file as of 02/12/2020.    Allergies:  Allergies  Allergen Reactions  . Cefdinir Hives, Itching and Rash  . Penicillins Hives    CHILDHOOD REACTION Has patient had a PCN reaction causing immediate rash, facial/tongue/throat swelling, SOB or lightheadedness with hypotension: #  #  NO  #  #  Has patient had a PCN reaction occurring within the last 10 years:#  #  #  YES  #  #  #   If all of the above answers are "NO", then may proceed with Cephalosporin use.  . Tape Itching and  Rash    Skin turns red Bandaid  . Penicillamine Rash    Family History: Family History  Problem Relation Age of Onset  . Stroke Mother 37       arterial wall of artery in eye   . Endometrial cancer Mother 20  . Dementia Father   . Stroke Father        "lots of many strokes"  . Aneurysm Father 10       AAA  . Multiple births Maternal Grandmother        died 5 days after delivery of unknown causes  . Colon cancer Maternal Aunt        late 57s  . Cancer Cousin        maternal cousin with an unknown form of cancer  . Breast cancer Neg Hx     Social History: Social History   Tobacco Use  . Smoking status: Never Smoker  . Smokeless tobacco: Never Used  Substance Use Topics  . Alcohol use: Not Currently    Comment: rarely  . Drug use: No   Social History   Social History Narrative   Patient does not have any biological children but has step children    Vital Signs:  Ht 5\' 7"  (1.702 m)   Wt 169 lb (76.7 kg)   BMI 26.47 kg/m    Neurological Exam: MENTAL STATUS including orientation to time, place, person, recent and remote memory, attention span and concentration, language, and fund of knowledge is normal.  Speech is not dysarthric.  CRANIAL NERVES: II:  No visual field defects.   III-IV-VI: Pupils  equal round and reactive to light.  Normal conjugate, extra-ocular eye movements in all directions of gaze.  No nystagmus.  No ptosis at rest or with sustained upgaze.   V:  Normal facial sensation.    VII:  Normal facial symmetry and movements.  Facial muscle strength is 5/5 throughout VIII:  Normal hearing and vestibular function.   IX-X:  Normal palatal movement.   XI:  Normal shoulder shrug and head rotation.   XII:  Normal tongue strength and range of motion, no deviation or fasciculation.  MOTOR:  No atrophy, fasciculations or abnormal movements.  No pronator drift.   Upper Extremity:  Right  Left  Deltoid  5/5   5/5   Biceps  5/5   5/5   Triceps  5/5   5/5   Infraspinatus 5/5  5/5  Medial pectoralis 5/5  5/5  Wrist extensors  5/5   5/5   Wrist flexors  5/5   5/5   Finger extensors  5/5   5/5   Finger flexors  5/5   5/5   Dorsal interossei  5/5   5/5   Abductor pollicis  5/5   5/5   Tone (Ashworth scale)  0  0   Lower Extremity:  Right  Left  Hip flexors  5/5   5/5   Hip extensors  5/5   5/5   Adductor 5/5  5/5  Abductor 5/5  5/5  Knee flexors  5/5   5/5   Knee extensors  5/5   5/5   Dorsiflexors  5/5   5/5   Plantarflexors  5/5   5/5   Toe extensors  5/5   5/5   Toe flexors  5/5   5/5   Tone (Ashworth scale)  0  0   MSRs:  Right        Left  brachioradialis 2+  2+  biceps 2+  2+  triceps 2+  2+  patellar 2+  2+  ankle jerk 2+  2+  Hoffman no  no  plantar response down  down   SENSORY:  Normal and symmetric perception of light touch, pinprick, vibration, and proprioception.  Romberg's sign absent.   COORDINATION/GAIT: Normal finger-to- nose-finger and heel-to-shin.  Intact rapid alternating movements bilaterally.  Able to rise from a chair without using arms.  Gait narrow based and stable. Tandem gait intact.    IMPRESSION: 1.  Intermittent diplopia.  Normal neurological exam with no ophthalmoplegia or bulbar weakness appreciated on exam.  -  Check AChR antibodies to evaluate for myasthenia gravis  - If negative, can offer a trial of mestinon 60mg  at 4pm and see if this helps.  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  Morning headaches, worsening over the past year since stopped night time oxygen, which is the likely reason for her symptoms.  - To be complete and given history of cancer, MRI brain wwo contrast will be ordered  - If imaging is unremarkable, patient will follow-up with pulmonologist regarding nocturnal oxygen    Thank you for allowing me to participate in patient's care.  If I can answer any additional questions, I would be pleased to do so.    Sincerely,     K. Posey Pronto, DO

## 2020-02-12 NOTE — Patient Instructions (Addendum)
1.  We will order MRI brain wwo contrast to evaluate your headaches  2.  Check labs for double vision  We will call you with the results

## 2020-02-15 ENCOUNTER — Ambulatory Visit: Payer: Medicare HMO | Admitting: Internal Medicine

## 2020-02-15 ENCOUNTER — Other Ambulatory Visit: Payer: Self-pay

## 2020-02-15 ENCOUNTER — Encounter: Payer: Self-pay | Admitting: Internal Medicine

## 2020-02-15 ENCOUNTER — Ambulatory Visit (INDEPENDENT_AMBULATORY_CARE_PROVIDER_SITE_OTHER): Payer: Medicare HMO

## 2020-02-15 DIAGNOSIS — R0600 Dyspnea, unspecified: Secondary | ICD-10-CM | POA: Diagnosis not present

## 2020-02-15 DIAGNOSIS — R0609 Other forms of dyspnea: Secondary | ICD-10-CM

## 2020-02-15 DIAGNOSIS — J9611 Chronic respiratory failure with hypoxia: Secondary | ICD-10-CM

## 2020-02-15 NOTE — Progress Notes (Signed)
Subjective:    Patient ID: Brooke Williams, female    DOB: 07-28-1946,   MRN: [id number]   Brief patient profile:  34 yowf never smoker with dx of sle 1996 rx plaquenil stopped in 2008 and no recurrence underwent sleep eval for freq disruption and fatigue since June 2016 and neg PSG 11/01/15  except for restless legs and decreased sats to a nadir of 88% so referred to pulmonary clinic 11/21/2015 by Dr Dohmeier       History of Present Illness  11/21/2015 1st [facility] office visit/ Nasif Bos   Chief Complaint  Patient presents with  . PULMONARY CONSULT    Referred by Dr. Brett Fairy. Pt c/o waking though the night, waking with headaches in the mornings, excessive fatigue and daytime somnolence. Pt has had sleep study done that showed nocturnal hypoxia.   finished chemo for endometrial ca July 2015 taxol and carboplat  And not aware of any sign doe/ cough hs or any other time  rec ono RA 11/26/15 desat x 60min 32 sec rec 2lpm > much better noct symptoms   09/30/2016  f/u ov/Rogerick Baldwin re: noct hypoxemia Chief Complaint  Patient presents with  . Follow-up    pt doing well, no complaints today.  pt scheduled appt because she is needing 02 qualification sats per DME company.  Pt only wears 02 qhs.    wearing 02 2lpm q hs/ sleeping better, wakes up feeling refreshed vs without it and no HA  Riding ex bike up to 20 min s sob  rec You should continue 02 at 2lpm indefinitely Continue to exercise regularly and let me know if you are losing ground in terms of exercise tolerance at any point    04/10/2019  f/u ov/Allisha Harter re: noct hypoxemia ? Etiology / needs recert for 02  Chief Complaint  Patient presents with  . Follow-up    Pt states here to recertify for her nocturnal o2. She states has occ SOB when she wakes up in the morning- has had issues with mold at home recently.   Dyspnea:  Ex bike x 20 min x 5 days a week on #4 resistance (was on #1) on 1 mile / 4 min  Cough: none Sleeping: on side  bed is flat/ sev pillows / ha and fatigue if not wearing 02 all night  SABA use: none  02: 2lpm not humidity  rec To get the most out of exercise, you need to be continuously aware that you are short of breath, but never out of breath, for 30 minutes daily.     Please see patient coordinator before you leave today  to schedule ONO on 2lpm and humidify 02  late add:  Needs ono on RA > no desats/ d/c 02     02/15/2020  f/u ov/Shankar Silber re: new sob x 6 m noct/ daytime fatigue / rides stationery bike but not since first of year  Chief Complaint  Patient presents with  . Acute Visit    having HA in the morning for at lease the past 6 months- wonders if stopping o2 summer 2020. She states that she feels SOB when she wakes up in the morning.  Dyspnea:  New am sob x 30 min assoc with HA  Cough: none  Sleeping: on side bed flat wakes w/u 4 h x 6 months q night sob / has not reclliner  SABA use: none  02: none  Am generlized but mostly bilateral frontal ha with some stuffy nose improves  as day goes on  No obvious day to day or daytime variability or assoc excess/ purulent sputum or mucus plugs or hemoptysis or cp or chest tightness, subjective wheeze or overt sinus or hb symptoms.    . Also denies any obvious fluctuation of symptoms with weather or environmental changes or other aggravating or alleviating factors except as outlined above   No unusual exposure hx or h/o childhood pna/ asthma or knowledge of premature birth.  Current Allergies, Complete Past Medical History, Past Surgical History, Family History, and Social History were reviewed in [employer] record.  ROS  The following are not active complaints unless bolded Hoarseness, sore throat, dysphagia, dental problems, itching, sneezing,  nasal congestion or discharge of excess mucus or purulent secretions, ear ache,   fever, chills, sweats, unintended wt loss or wt gain, classically pleuritic or exertional cp,   orthopnea pnd or arm/hand swelling  or leg swelling, presyncope, palpitations, abdominal pain, anorexia, nausea, vomiting, diarrhea  or change in bowel habits or change in bladder habits, change in stools or change in urine, dysuria, hematuria,  rash, arthralgias, visual complaints= diplopia , headache, numbness, weakness or ataxia or problems with walking or coordination,  change in mood or  memory.        Current Meds  Medication Sig  . Alpha-Lipoic Acid 600 MG CAPS Take by mouth daily.  Marland Kitchen CALCIUM PO Take 743 mg by mouth daily.  Marland Kitchen doxycycline (VIBRA-TABS) 100 MG tablet Take 1 tablet by mouth daily.  . famotidine (PEPCID) 20 MG tablet Take 20 mg by mouth as needed for heartburn or indigestion.  . fluocinonide gel (LIDEX) 0.05 % APPLY TO AFFECTED AREA twice a day as needed for canker sores.  . fluticasone (FLONASE) 50 MCG/ACT nasal spray Place into both nostrils 2 (two) times daily.  Marland Kitchen loratadine-pseudoephedrine (CLARITIN-D 12-HOUR) 5-120 MG tablet Take 1 tablet by mouth daily as needed for allergies.  . Multiple Vitamin (MULTIVITAMIN WITH MINERALS) TABS Take 1 tablet by mouth daily.  . prednisoLONE acetate (PRED FORTE) 1 % ophthalmic suspension Place 1 drop into both eyes daily.  . Psyllium (METAMUCIL FIBER PO) Take 5 capsules by mouth daily.   Marland Kitchen SYNTHROID 75 MCG tablet Take 75 mcg by mouth daily before breakfast.                        Objective:   Physical Exam        02/15/2020        169 04/10/2019        173  09/30/2016        169   11/21/15 176 lb 9.6 oz (80.105 kg)  11/19/15 174 lb (78.926 kg)  10/28/15 179 lb (81.194 kg)      Vital signs reviewed  02/15/2020  - Note at rest 02 sats  97% on RA    HEENT : pt wearing mask not removed for exam due to covid -19 concerns.    NECK :  without JVD/Nodes/TM/ nl carotid upstrokes bilaterally   LUNGS: no acc muscle use,  Nl contour chest which is clear to A and P bilaterally without cough on insp or exp maneuvers   CV:   RRR  no s3 or murmur or increase in P2, and no edema   ABD:  soft and nontender with nl inspiratory excursion in the supine position. No bruits or organomegaly appreciated, bowel sounds nl  MS:  Nl gait/ ext warm without deformities, calf tenderness,  cyanosis or clubbing No obvious joint restrictions   SKIN: warm and dry without lesions    NEURO:  alert, approp, nl sensorium with  no motor or cerebellar deficits apparent.        CXR PA and Lateral:   02/15/2020 :    I personally reviewed images and   impression as follows:   Minimal cm/ no change R nodule/ no chf           Assessment & Plan:

## 2020-02-15 NOTE — Patient Instructions (Addendum)
Try sleeping in reclined position  - bed blocks  Please remember to go to the lab and x-ray department   for your tests - we will call you with the results when they are available.     We will repeat your ONO on Room air

## 2020-02-16 ENCOUNTER — Encounter: Payer: Self-pay | Admitting: Internal Medicine

## 2020-02-16 LAB — BASIC METABOLIC PANEL
BUN: 22 mg/dL (ref 6–23)
CO2: 29 mEq/L (ref 19–32)
Calcium: 9.8 mg/dL (ref 8.4–10.5)
Chloride: 104 mEq/L (ref 96–112)
Creatinine, Ser: 1.03 mg/dL (ref 0.40–1.20)
GFR: 52.73 mL/min — ABNORMAL LOW (ref >60.00)
Glucose, Bld: 95 mg/dL (ref 70–99)
Potassium: 4.3 mEq/L (ref 3.5–5.1)
Sodium: 140 mEq/L (ref 135–145)

## 2020-02-16 LAB — CBC WITH DIFFERENTIAL/PLATELET
Basophils Absolute: 0 10*3/uL (ref 0.0–0.1)
Basophils Relative: 0.4 % (ref 0.0–3.0)
Eosinophils Absolute: 0.3 10*3/uL (ref 0.0–0.7)
Eosinophils Relative: 4 % (ref 0.0–5.0)
HCT: 41.9 % (ref 36.0–46.0)
Hemoglobin: 14.1 g/dL (ref 12.0–15.0)
Lymphocytes Relative: 20.3 % (ref 12.0–46.0)
Lymphs Abs: 1.4 10*3/uL (ref 0.7–4.0)
MCHC: 33.6 g/dL (ref 30.0–36.0)
MCV: 91.3 fl (ref 78.0–100.0)
Monocytes Absolute: 0.6 10*3/uL (ref 0.1–1.0)
Monocytes Relative: 9.3 % (ref 3.0–12.0)
Neutro Abs: 4.4 10*3/uL (ref 1.4–7.7)
Neutrophils Relative %: 66 % (ref 43.0–77.0)
Platelets: 258 10*3/uL (ref 150.0–400.0)
RBC: 4.59 Mil/uL (ref 3.87–5.11)
RDW: 13.6 % (ref 11.5–15.5)
WBC: 6.7 10*3/uL (ref 4.0–10.5)

## 2020-02-16 LAB — SARS-COV-2 IGG: SARS-COV-2 IgG: 0.05

## 2020-02-16 LAB — TSH: TSH: 1.46 u[IU]/mL (ref 0.35–4.50)

## 2020-02-16 LAB — BRAIN NATRIURETIC PEPTIDE: Pro B Natriuretic peptide (BNP): 52 pg/mL (ref 0.0–100.0)

## 2020-02-16 NOTE — Assessment & Plan Note (Signed)
See PSG 12//2/16  37 min desats but nadir was 88%  - 11/21/2015  Walked RA x 3 laps @ 185 ft each stopped due to  End of study, nl pace, no sob or desat   - ONO RA 11/26/2015 :  desat < 89% x  5 m 32 sec >  rec 2lpm and repeat on 2lpm > done 12/03/15 and no desat so rec continue 2lpm hs only  - 04/10/2019   Walked RA  2 laps @  approx 250ft each @ fast pace  stopped due to  End of study no sob  / no desats - ONO RA 04/11/19 no significant desats > dc 02  - 02/15/2020 no desats walking 500 ft - repeat ONO on RA rec 02/15/2020   Doubt noct desats but note assoc am ha better as day goes on is not typical of a tension pattern and may suggest noct desats or hypercarbia and given h/o diplopia the concern for MG has been raised which is a possible explanation for sob supine if affecting the diaphragm > w/u in progress.   Medical decision making was a high level of complexity in this case because of a chronic condition/diagnosis with   Progression   requiring extra time for  H and P, chart review, counseling,  directly observing portions of ambulatory 02 saturation study/  and generating customized AVS unique to this office visit and charting.   Each maintenance medication was reviewed in detail including emphasizing most importantly the difference between maintenance and prns and under what circumstances the prns are to be triggered using an action plan format where appropriate. Please see avs for details which were reviewed in writing by both me and my nurse and patient given a written copy highlighted where appropriate with yellow highlighter for the patient's continued care at home along with an updated version of their medications.  Patient was asked to maintain medication reconciliation by comparing this list to the actual medications being used at home and to contact this office right away if there is a conflict or discrepancy.

## 2020-02-16 NOTE — Assessment & Plan Note (Signed)
Onset 08/2019 mostly nocturnal - 02/15/2020   Walked RA x two laps =  approx 569ft @ moderate  pace - stopped due to end of study sob  with sats of 97 % at the end of the study.  Symptoms of rest/ noct sob  are markedly disproportionate to objective findings and not clear to what extent this is actually a pulmonary  problem but pt does appear to have difficult to sort out respiratory symptoms of unknown origin for which  DDX  = almost all start with A and  include Adherence, Ace Inhibitors, Acid Reflux, Active Sinus Disease, Alpha 1 Antitripsin deficiency, Anxiety masquerading as Airways dz,  ABPA,  Allergy(esp in young), Aspiration (esp in elderly), Adverse effects of meds,  Active smoking or Vaping, A bunch of PE's/clot burden (a few small clots can't cause this syndrome unless there is already severe underlying pulm or vascular dz with poor reserve),  Anemia or thyroid disorder, plus two Bs  = Bronchiectasis and Beta blocker use..and one C= CHF   Adherence is always the initial "prime suspect" and is a multilayered concern that requires a "trust but verify" approach in every patient - starting with knowing how to use medications, especially inhalers, correctly, keeping up with refills and understanding the fundamental difference between maintenance and prns vs those medications only taken for a very short course and then stopped and not refilled.  - return with all meds in hand using a trust but verify approach to confirm accurate Medication  Reconciliation The principal here is that until we are certain that the  patients are doing what we've asked, it makes no sense to ask them to do more.    ? Acid (or non-acid) GERD > always difficult to exclude as up to 75% of pts in some series report no assoc GI/ Heartburn symptoms> rec pepcid 20 mg automatically at hs x 6 week trial/ bed blocks/diet reviewed  ? Allergy/ asthma > no assoc cough or rhinitis to suggest  ? Active sinus dz > assoc earche/ am ha may  suggest > for MRI anyway soon so advised to wait and look at this issue then   ? Adverse effect of meds > nothing to suggest  ? chf >

## 2020-02-16 NOTE — Progress Notes (Signed)
Spoke with pt and notified of results per Dr. Wert. Pt verbalized understanding and denied any questions. 

## 2020-02-19 LAB — MYASTHENIA GRAVIS PANEL 2
A CHR BINDING ABS: <0.3 nmol/L
ACHR Blocking Abs: <15 % Inhibition (ref <15)
Acetylchol Modul Ab: 12 % Inhibition

## 2020-02-19 NOTE — Progress Notes (Signed)
LMTCB

## 2020-02-20 ENCOUNTER — Telehealth: Payer: Self-pay | Admitting: Internal Medicine

## 2020-02-20 NOTE — Progress Notes (Signed)
Spoke with pt and notified of results per Dr. Wert. Pt verbalized understanding and denied any questions. 

## 2020-02-20 NOTE — Telephone Encounter (Signed)
Tanda Rockers, MD sent to [facility], [place]  Call patient : Studies are unremarkable, only change I rec for now is add pepcid 20 mg every night until next ov (not prn)      Spoke with pt and notified of results per Dr. Melvyn Novas. Pt verbalized understanding and denied any questions.

## 2020-02-22 ENCOUNTER — Telehealth: Payer: Self-pay

## 2020-02-22 ENCOUNTER — Other Ambulatory Visit: Payer: Self-pay

## 2020-02-22 MED ORDER — PYRIDOSTIGMINE BROMIDE 60 MG PO TABS: 60.0000 mg | ORAL_TABLET | Freq: Every day | ORAL | 0 refills | Status: DC

## 2020-02-22 NOTE — Progress Notes (Signed)
Dr. Patel's patient 

## 2020-02-22 NOTE — Telephone Encounter (Signed)
Patient aware of results and mestinon rx sent to pharmacy.

## 2020-02-22 NOTE — Telephone Encounter (Signed)
-----   Message from Alda Berthold, DO sent at 02/22/2020 11:46 AM EDT ----- Please call and inform patient that labs are negative, but we can offer a trial of mestinon 60mg  at 4pm and see if this improves her double vision.  If she is agreeable, please send 14-day supply of mestinon 60mg  - 1 tab daily at 4 pm, 0 refills.  Call with update in 10-14 days. Thanks.

## 2020-02-23 ENCOUNTER — Telehealth: Payer: Self-pay | Admitting: *Deleted

## 2020-02-23 MED ORDER — PYRIDOSTIGMINE BROMIDE 60 MG PO TABS: ORAL_TABLET | ORAL | 0 refills | Status: DC

## 2020-02-23 NOTE — Telephone Encounter (Signed)
ONO on RA dated 02/19/20 was reviewed by Dr Melvyn Novas  Per Dr Melvyn Novas this looks ok and pt does not need o2 with sleep  Spoke with pt and notified of results per Dr. Melvyn Novas. Pt verbalized understanding.  She is asking what could be causing her to feel so SOB in the mornings when she wakes up  She has been taking the pepcid at night as suggested and has not seen any improvement  Please advise thanks

## 2020-02-25 NOTE — Telephone Encounter (Signed)
If she is already using recliner or bed blocks with same problem then rec   1) repeat a formal sleep eval either with one of our sleep docs or Dohmeir  2) full pfts/ ov same day

## 2020-02-26 NOTE — Telephone Encounter (Signed)
Called and spoke with pt to see if she has been able to try bed blocks due to pepcid not showing any improvements. Pt stated she has ordered the bed blocks and they should arrive today. Stated to pt if after trying the bed blocks and if not noticing any improvement after that to call our office to let us know and we would do recommendations per Dr. Melvyn Novas. Pt verbalized understanding. Nothing further needed.

## 2020-02-27 ENCOUNTER — Ambulatory Visit: Payer: Medicare HMO | Admitting: Rheumatology

## 2020-03-08 ENCOUNTER — Ambulatory Visit
Admission: RE | Admit: 2020-03-08 | Discharge: 2020-03-08 | Disposition: A | Discharge status: Home / Self Care | Payer: Medicare HMO | Source: Ambulatory Visit | Attending: Neurology | Admitting: Neurology

## 2020-03-08 MED ORDER — GADOBENATE DIMEGLUMINE 529 MG/ML IV SOLN
15.0000 mL | Freq: Once | INTRAVENOUS | Status: AC | PRN
  Administered 2020-03-08: 15 mL via INTRAVENOUS

## 2020-03-14 ENCOUNTER — Other Ambulatory Visit: Payer: Self-pay

## 2020-03-14 DIAGNOSIS — Z79899 Other long term (current) drug therapy: Secondary | ICD-10-CM

## 2020-03-14 DIAGNOSIS — E785 Hyperlipidemia, unspecified: Secondary | ICD-10-CM

## 2020-03-14 LAB — COMPREHENSIVE METABOLIC PANEL
ALT: 31 IU/L (ref 0–32)
AST: 28 IU/L (ref 0–40)
Albumin/Globulin Ratio: 1.9 (ref 1.2–2.2)
Albumin: 4.2 g/dL (ref 3.7–4.7)
Alkaline Phosphatase: 113 IU/L (ref 39–117)
BUN/Creatinine Ratio: 20 (ref 12–28)
BUN: 15 mg/dL (ref 8–27)
Bilirubin Total: 0.2 mg/dL (ref 0.0–1.2)
CO2: 21 mmol/L (ref 20–29)
Calcium: 9.3 mg/dL (ref 8.7–10.3)
Chloride: 106 mmol/L (ref 96–106)
Creatinine, Ser: 0.74 mg/dL (ref 0.57–1.00)
GFR calc Af Amer: 94 mL/min/{1.73_m2} (ref >59)
GFR calc non Af Amer: 82 mL/min/{1.73_m2} (ref >59)
Globulin, Total: 2.2 g/dL (ref 1.5–4.5)
Glucose: 88 mg/dL (ref 65–99)
Potassium: 4.3 mmol/L (ref 3.5–5.2)
Sodium: 143 mmol/L (ref 134–144)
Total Protein: 6.4 g/dL (ref 6.0–8.5)

## 2020-03-14 LAB — LIPID PANEL
Chol/HDL Ratio: 3.1 ratio (ref 0.0–4.4)
Cholesterol, Total: 154 mg/dL (ref 100–199)
HDL: 50 mg/dL (ref >39)
LDL Chol Calc (NIH): 83 mg/dL (ref 0–99)
Triglycerides: 118 mg/dL (ref 0–149)
VLDL Cholesterol Cal: 21 mg/dL (ref 5–40)

## 2020-03-19 ENCOUNTER — Encounter: Payer: Self-pay | Admitting: Cardiovascular Disease

## 2020-03-19 ENCOUNTER — Telehealth (INDEPENDENT_AMBULATORY_CARE_PROVIDER_SITE_OTHER): Payer: Medicare HMO | Admitting: Cardiovascular Disease

## 2020-03-19 VITALS — Ht 67.0 in | Wt 165.0 lb

## 2020-03-19 DIAGNOSIS — Z8249 Family history of ischemic heart disease and other diseases of the circulatory system: Secondary | ICD-10-CM

## 2020-03-19 DIAGNOSIS — E039 Hypothyroidism, unspecified: Secondary | ICD-10-CM

## 2020-03-19 DIAGNOSIS — Z8669 Personal history of other diseases of the nervous system and sense organs: Secondary | ICD-10-CM

## 2020-03-19 DIAGNOSIS — M329 Systemic lupus erythematosus, unspecified: Secondary | ICD-10-CM

## 2020-03-19 DIAGNOSIS — E785 Hyperlipidemia, unspecified: Secondary | ICD-10-CM

## 2020-03-19 DIAGNOSIS — G43719 Chronic migraine without aura, intractable, without status migrainosus: Secondary | ICD-10-CM

## 2020-03-19 DIAGNOSIS — Z8541 Personal history of malignant neoplasm of cervix uteri: Secondary | ICD-10-CM

## 2020-03-19 MED ORDER — EZETIMIBE 10 MG PO TABS: 10.0000 mg | ORAL_TABLET | Freq: Every day | ORAL | 3 refills | Status: DC

## 2020-03-19 NOTE — Progress Notes (Signed)
Virtual Visit via Telephone Note   This visit type was conducted due to national recommendations for restrictions regarding the COVID-19 Pandemic (e.g. social distancing) in an effort to limit this patient's exposure and mitigate transmission in our community.  Due to her co-morbid illnesses, this patient is at least at moderate risk for complications without adequate follow up.  This format is felt to be most appropriate for this patient at this time.  The patient did not have access to video technology/had technical difficulties with video requiring transitioning to audio format only (telephone).  All issues noted in this document were discussed and addressed.  No physical exam could be performed with this format.  Please refer to the patient's chart for her  consent to telehealth for [facility].   The patient was identified using 2 identifiers.  Date:  03/19/2020   ID:  Brooke Williams, DOB 1946-07-17, MRN [id number]  Patient Location: Home Provider Location: Office  PCP:  Nickola Major, MD  Cardiologist:  Shelva Majestic, MD Electrophysiologist:  None   Evaluation Performed:  Follow-Up Visit  Chief Complaint:  6 month F/U  History of Present Illness:    Brooke Williams is a 74 y.o. female who has a history of lupus erythematosus, hyperlipidemia, and strong family history for premature coronary disease. 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 In the past I tried initiating therapy with Simcor 500/20 due to her lipid parameters but she was unable to tolerate this. Subsequently, we tried low-dose Crestor but again she stopped it secondary to development of vague myalgias.  She was diagnosed with endometrial cancer and underwent complete  hysterectomy in January 2014.  Initially she did not require chemotherapy or radiation.  However, since I last saw her , she develop recurrence in 2015  and required surgery in February 2015.  She received paclitaxel and carboplatin chemotherapy.  She also has had several procedures for removal of lipoma from her lower back.  She is involved in clinical research study at [facility] evaluating the use of metformin for recurrent cancer.  When I  saw her in December 2016 her sister had recently undergone placement of 2 coronary stents and her mother's twin sister had just undergone CABG surgery. She had undergone previous left knee replacement several years ago.  She continues to be followed closely at [facility] and she brought with her recent results of her CT scans where she was found to have a stable right lower lobe 1.1 cm nodule.  There was no evidence for recurrent or metastatic disease in the abdomen or pelvis  I saw her in May 2019 for preoperative clearance prior to undergoing right knee replacement with Dr. Frederik Pear. A preoperative clearance  ECG showed small Q waves in 3 and nondiagnostic diminutive Q wave in aVF.  Because of her ECG she was referred to me today prior to undergoing surgery the following day.  I felt she was stable from a cardiac standpoint.  She underwent surgery successfully without cardiovascular compromise.  During that evaluation I noticed that she had had laboratory from her primary physician in November 2018 and her cholesterol was elevated at 226 with an LDL of 148.  She apparently was given a prescription for Crestor 5 mg but she never started  this.  She developed the flu in January and then required surgery in February for rectal prolapse.    When I saw her in May 2019 I recommended initiation of Crestor. She started at 5 mg with plans to titrate to 10 as tolerated.  If she was unable to tolerate Crestor then Zetia was to be added and potentially she could be a candidate for PCSK9  inhibition.  She has tolerated Crestor 5 mg but never titrated this to 10 mg.  Most recent lipid studies this past week has shown marked improvement in LDL to 89.  However, she states that she stopped taking Crestor around the time of her surgery but several weeks ago reinstituted this and as result she believes her levels may actually be further improved.  She was recently seen by her primary physician and she is to start Fosamax.  She denies chest pain.  She denies palpitations.   I saw her in September 2019.  Apparently she had not started the Crestor initially and waited until completed healing from knee surgery prior to instituting therapy.  I recommended increased activity.  She had repeat laboratory on January 31, 2019.  Total cholesterol was 166 triglycerides had improved from 150 down to 72 HDL was 57 and LDL 95.  She was tolerant she is tolerating Crestor 5 mg but never titrated up to 10 mg.  She denies any palpitations.  She has been on levothyroxine 75 mcg for hypothyroidism.   I saw her in March 2020 and last saw her in October 2020.  Since her prior evaluation she continued to do well and denied chest pain or shortness of breath.  However she did not tolerate the increased trial of Crestor to 10 mg and therefore reduced her dose back to 5 mg. She was still noticing some joint discomfort and as result in early August completely discontinued statin therapy.  She has had issues with headaches.  In the past she has seen Dr. Lajean Manes and is felt to have longstanding migraine headaches.  She sees Dr. Leslie Dales for her systemic lupus.  She underwent repeat laboratory on August 28, 2019.  Renal function was stable.  TSH was normal at 246.  Total cholesterol was 181, triglycerides 127, HDL 55, LDL 104.  I recommended initiation of Zetia 10 mg to provide an additional 20% reduction in her cholesterol particularly with a very strong family history for premature CAD.  She was also inquiring about omega-3 fatty  acids.  Her triglycerides were not significantly elevated but suggested she may have some fish oil above and beyond lipid-lowering treatment.     Since her last evaluation in October 2020, she has continued to do well.  She specifically denies any chest pain or shortness of breath.  She recently had an inner eye inflammation resulting in diplopia.  She had recent follow-up laboratory on Zetia which showed a total cholesterol of 154 improved from 181, LDL 83, improved from 104, and triglycerides of 118.  Glucose was 88.  Creatinine 0.4.  She has not been exercising regularly and in the past was riding an exercise bike up to 20 minutes at a time.  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  She is unaware of any recent palpitations.  She presents for telemedicine evaluation   The patient does not have symptoms concerning for COVID-19 infection (fever, chills, cough, or new shortness of breath).    Past Medical History:  Diagnosis Date  . Arthritis  arthritis-hands. cervical disc degeneration  . Back pain    6th vertebrae has collapse on 7th,pinched nerve feeling occ  . Dizziness    rarely,takes Meclizine if needed  . Endometrial cancer ([place]) 2014  . GERD (gastroesophageal reflux disease)    controls with Zantac  . Headache(784.0)    headaches frequently but states since placed on 2L/m via N/C headaches have improved  . History of colon polyps    benign  . History of staph infection   . Hyperlipidemia    takes krill oil daily  . Hypothyroidism    takes Synthroid daily  . Ileus ([place]) 10/2015   was treated  . Joint pain   . Joint swelling   . Lupus ([place])    remission since 2008  . Nocturia   . OSA (obstructive sleep apnea)    sleep study in epic from 11-01-15.Uses Oxygen  . Pneumonia    hx of-within the last 15 yrs  . PONV (postoperative nausea and vomiting)   . Restless leg syndrome    takes Mirapex nightly  . Seasonal allergies    takes Allegra daily and uses Astelin  Nasal Spray as needed   Past Surgical History:  Procedure Laterality Date  . ABDOMINAL HYSTERECTOMY    . BRAIN SURGERY     left cataract  . COLON SURGERY     12/2017  . COLONOSCOPY    . DILATION AND CURETTAGE OF UTERUS     2008-polyp removed  . HAND SURGERY  2006   left thumb surgery with pin  . KNEE ARTHROPLASTY    . KNEE ARTHROSCOPY Left 04/08/2018   Procedure: ARTHROSCOPY LEFT KNEE;  Surgeon: Frederik Pear, MD;  Location: [place];  Service: Orthopedics;  Laterality: Left;  . lipomas removed from back   40 yrs ago  . MASS EXCISION N/A 10/05/2014   Procedure: EXCISION OF BILATERAL LOWER BACK MASSES;  Surgeon: Coralie Keens, MD;  Location: [place];  Service: General;  Laterality: N/A;  . MEDIAL PARTIAL KNEE REPLACEMENT  2005   LT knee  . OTHER SURGICAL HISTORY     metal pin/screw to repair arthritis in LT thumb joint  . ROBOTIC ASSISTED TOTAL HYSTERECTOMY WITH BILATERAL SALPINGO OOPHERECTOMY  12/20/2012   Procedure: ROBOTIC ASSISTED TOTAL HYSTERECTOMY WITH BILATERAL SALPINGO OOPHORECTOMY;  Surgeon: Imagene Gurney A. Alycia Rossetti, MD;  Location: [facility];  Service: Gynecology;  Laterality: N/A;  WITH PELVIC PARAAORTIC   . ROBOTIC PELVIC AND PARA-AORTIC LYMPH NODE DISSECTION  12/20/2012   Procedure: ROBOTIC PELVIC AND PARA-AORTIC LYMPH NODE DISSECTION;  Surgeon: Imagene Gurney A. Alycia Rossetti, MD;  Location: [facility];  Service: Gynecology;  Laterality: Bilateral;  . TOTAL KNEE ARTHROPLASTY Right 04/08/2018   Procedure: RIGHT TOTAL KNEE ARTHROPLASTY;  Surgeon: Frederik Pear, MD;  Location: [place];  Service: Orthopedics;  Laterality: Right;  Right TKA and Left Knee Arthroscopy  . TOTAL KNEE REVISION Left 02/03/2016   Procedure: TOTAL KNEE REVISION;  Surgeon: Frederik Pear, MD;  Location: [place];  Service: Orthopedics;  Laterality: Left;  . WRIST SURGERY Left 06/07/2019   Dr. Grandville Silos      Current Meds  Medication Sig  . Alpha-Lipoic Acid 600 MG CAPS Take by mouth daily.  Marland Kitchen CALCIUM PO Take 743 mg by mouth  daily.  Marland Kitchen doxycycline (VIBRA-TABS) 100 MG tablet Take 1 tablet by mouth daily.  . famotidine (PEPCID) 20 MG tablet Take 20 mg by mouth as needed for heartburn or indigestion.  . fluocinonide gel (LIDEX) 0.05 % APPLY TO AFFECTED  AREA twice a day as needed for canker sores.  . fluticasone (FLONASE) 50 MCG/ACT nasal spray Place into both nostrils 2 (two) times daily.  Marland Kitchen loratadine-pseudoephedrine (CLARITIN-D 12-HOUR) 5-120 MG tablet Take 1 tablet by mouth daily as needed for allergies.  . Multiple Vitamin (MULTIVITAMIN WITH MINERALS) TABS Take 1 tablet by mouth daily.  . Psyllium (METAMUCIL FIBER PO) Take 5 capsules by mouth daily.   Marland Kitchen pyridostigmine (MESTINON) 60 MG tablet Take once daily at 4pm.  Call with update in 10-14 days to determine if we medication needs to be continued.  Marland Kitchen SYNTHROID 75 MCG tablet Take 75 mcg by mouth daily before breakfast.   . [DISCONTINUED] prednisoLONE acetate (PRED FORTE) 1 % ophthalmic suspension Place 1 drop into both eyes daily.     Allergies:   Cefdinir, Penicillins, Tape, and Penicillamine   Social History   Tobacco Use  . Smoking status: Never Smoker  . Smokeless tobacco: Never Used  Substance Use Topics  . Alcohol use: Not Currently    Comment: rarely  . Drug use: No     Family Hx: The patient's family history includes Aneurysm (age of onset: 12) in her father; Cancer in her cousin; Colon cancer in her maternal aunt; Dementia in her father; Endometrial cancer (age of onset: 62) in her mother; Multiple births in her maternal grandmother; Stroke in her father; Stroke (age of onset: 19) in her mother. There is no history of Breast cancer.  ROS:   Please see the history of present illness.    No fevers chills night sweats.  Recent inner eye inflammation leading to diplopia.  History of migraine headaches.  No chest pain or palpitations.  Positive for lupus erythematosus remotely treated with Plaquenil.  History of restless legs. Previous sleep  evaluations at [facility] neurology All other systems reviewed and are negative.   Prior CV studies:   The following studies were reviewed today:  I reviewed records from [facility] neurology.  PSG negative for sleep apnea with oxygen nadir at 88%.  Labs/Other Tests and Data Reviewed:    EKG: Personally reviewed from August 31, 2019 shows normal sinus rhythm at 96 bpm with nonspecific ST changes.  Normal intervals.  No ectopy.  Recent Labs: 02/15/2020: Hemoglobin 14.1; Platelets 258.0; Pro B Natriuretic peptide (BNP) 52.0; TSH 1.46 03/14/2020: ALT 31; BUN 15; Creatinine, Ser 0.74; Potassium 4.3; Sodium 143   Recent Lipid Panel Lab Results  Component Value Date/Time   CHOL 154 03/14/2020 09:02 AM   TRIG 118 03/14/2020 09:02 AM   HDL 50 03/14/2020 09:02 AM   CHOLHDL 3.1 03/14/2020 09:02 AM   CHOLHDL 3.7 09/06/2015 09:14 AM   LDLCALC 83 03/14/2020 09:02 AM    Wt Readings from Last 3 Encounters:  03/19/20 165 lb (74.8 kg)  02/15/20 169 lb (76.7 kg)  02/12/20 169 lb (76.7 kg)     Objective:    Vital Signs:  Ht 5\' 7"  (1.702 m)   Wt 165 lb (74.8 kg)   BMI 25.84 kg/m    Since this was a telemedicine visit I could not physically examine the patient. Breathing was normal and not labored.  There was no audible wheezing.  She was unaware of any palpitations.  There was no chest pain. Nontender. History of hypothyroidism  No tremors. No swelling. Normal affect and mood   ASSESSMENT & PLAN:    1. Hyperlipidemia: History of statin induced myalgia now on Zetia and no longer taking low-dose rosuvastatin.  Most recent LDL improved to  83 from 104.  Needs renewal for Zetia and will refill. 2. History of hypothyroidism: Continues to be on levothyroxine 3. Strong family history for CAD: No current anginal symptoms consider future cardiac calcium score 4. Systemic lupus erythematosus, currently controlled; remotely on Plaquenil 5. Endometrial cancer status post complete hysterectomy  January 2014 6. Lifestyle: Discussed resumption of exercise with stationary bike for cardiovascular benefit if unable to walk routinely. 7. History of migraine headaches: Followed by Dr. Erlinda Hong  COVID-19 Education: The signs and symptoms of COVID-19 were discussed with the patient and how to seek care for testing (follow up with PCP or arrange E-visit).  The importance of social distancing was discussed today.  Time:   Today, I have spent 15 minutes with the patient with telehealth technology discussing the above problems.     Medication Adjustments/Labs and Tests Ordered: Current medicines are reviewed at length with the patient today.  Concerns regarding medicines are outlined above.   Tests Ordered: No orders of the defined types were placed in this encounter.   Medication Changes: No orders of the defined types were placed in this encounter.   Follow Up:    Signed, Shelva Majestic, MD  03/19/2020 2:53 PM    [place]

## 2020-03-19 NOTE — Patient Instructions (Signed)

## 2020-03-26 ENCOUNTER — Encounter: Payer: Self-pay | Admitting: Cardiovascular Disease

## 2020-10-10 ENCOUNTER — Other Ambulatory Visit: Payer: Self-pay | Admitting: Family Medicine

## 2020-10-10 DIAGNOSIS — M8589 Other specified disorders of bone density and structure, multiple sites: Secondary | ICD-10-CM

## 2020-10-15 ENCOUNTER — Other Ambulatory Visit: Payer: Self-pay | Admitting: Family Medicine

## 2020-10-15 DIAGNOSIS — Z1231 Encounter for screening mammogram for malignant neoplasm of breast: Secondary | ICD-10-CM

## 2020-10-16 ENCOUNTER — Ambulatory Visit
Admission: RE | Admit: 2020-10-16 | Discharge: 2020-10-16 | Disposition: A | Discharge status: Home / Self Care | Payer: Medicare HMO | Source: Ambulatory Visit | Attending: Family Medicine | Admitting: Family Medicine

## 2020-10-16 ENCOUNTER — Other Ambulatory Visit: Payer: Self-pay

## 2020-10-16 DIAGNOSIS — Z1231 Encounter for screening mammogram for malignant neoplasm of breast: Secondary | ICD-10-CM

## 2020-10-21 ENCOUNTER — Other Ambulatory Visit: Payer: Self-pay | Admitting: Family Medicine

## 2020-10-21 DIAGNOSIS — R928 Other abnormal and inconclusive findings on diagnostic imaging of breast: Secondary | ICD-10-CM

## 2020-10-30 IMAGING — MR MR HEAD WO/W CM
13 series · 48 of 48 positions shown · IV contrast (multihance)
Comparison: None.

CLINICAL DATA: Headache, diplopia. History of the material cancer.

EXAM:
MRI HEAD WITHOUT AND WITH CONTRAST
TECHNIQUE: Multiplanar, multiecho pulse sequences of the brain and surrounding
structures were obtained without and with intravenous contrast.
CONTRAST:  15mL MULTIHANCE GADOBENATE DIMEGLUMINE 529 MG/ML IV SOLN

[Series 2: t1_se_sag · sagittal · 5.0mm · 0.45mm/px · 1 of 19 slices shown (1 of 2)]
[im 1/19]
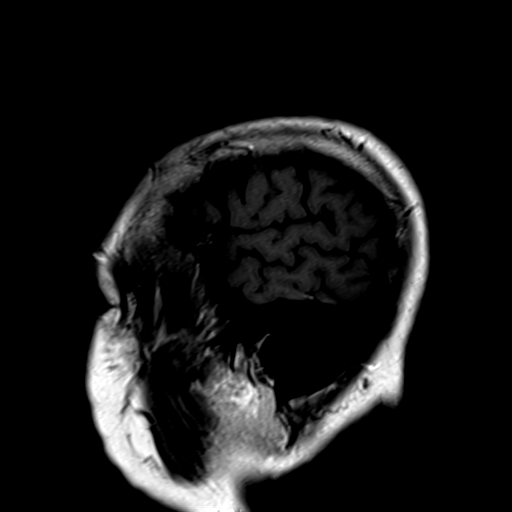

[Series 3: ep2d_diff_ · axial · 3.0mm · 1.80mm/px · z∈[-38,+102]mm · 7 of 96 slices shown]
[im 1/96]
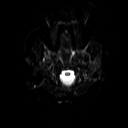
[im 16/96]
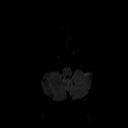
[im 32/96]
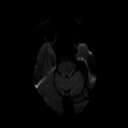
[im 48/96]
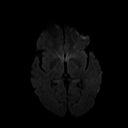
[im 64/96]
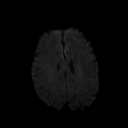
[im 80/96]
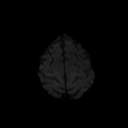
[im 96/96]
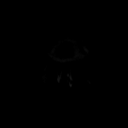

[Series 4: ep2d_diff__adc · axial · 3.0mm · 1.80mm/px · z∈[-38,+102]mm · 3 of 47 slices shown]
[im 1/47]
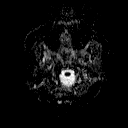
[im 24/47]
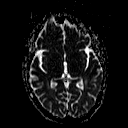
[im 47/47]
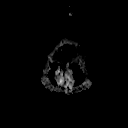

[Series 5: ep2d_diff_cor · coronal · 5.0mm · 1.77mm/px · 3 of 46 slices shown]
[im 1/46]
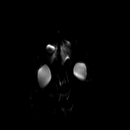
[im 23/46]
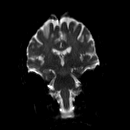
[im 46/46]
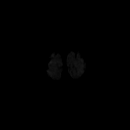

[Series 6: ep2d_diff_cor_adc · coronal · 5.0mm · 1.77mm/px · 2 of 25 slices shown]
[im 1/25]
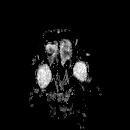
[im 25/25]
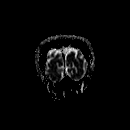

[Series 8: swi_images · axial · 4.0mm · 0.90mm/px · z∈[-36,+103]mm · 3 of 36 slices shown]
[im 1/36]
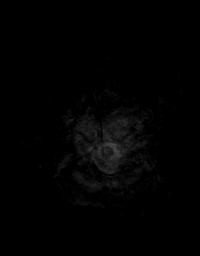
[im 18/36]
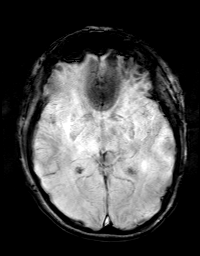
[im 36/36]
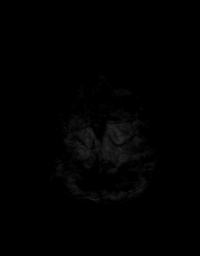

[Series 9: FLAIR · axial · 3.0mm · 0.43mm/px · z∈[-46,+110]mm · 2 of 27 slices shown]
[im 1/27]
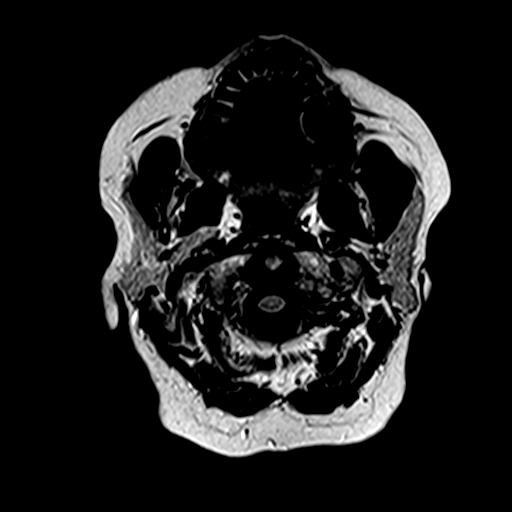
[im 27/27]
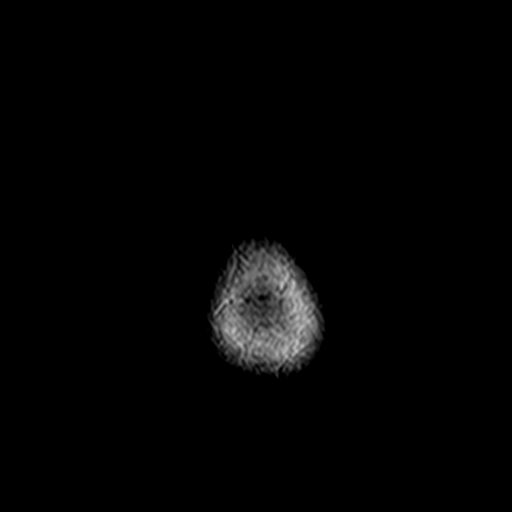

[Series 10: t1_mpr_tra · axial · 1.0mm · 0.72mm/px · z∈[-38,+104]mm · 10 of 144 slices shown (1 of 2)]
[im 1/144]
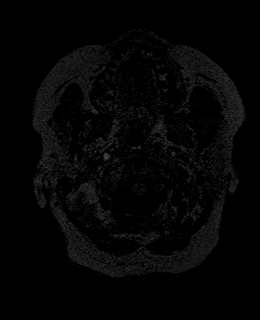
[im 16/144]
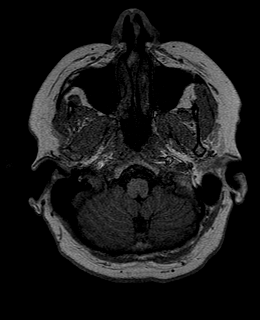
[im 32/144]
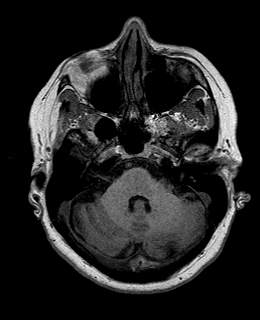
[im 48/144]
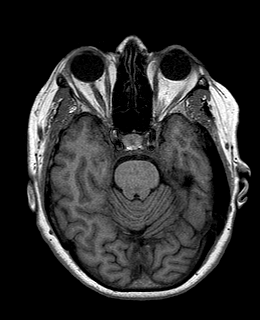
[im 64/144]
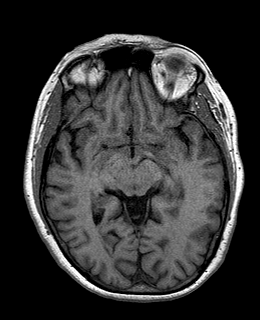
[im 80/144]
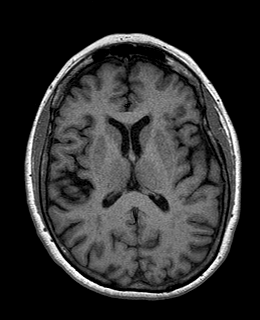
[im 96/144]
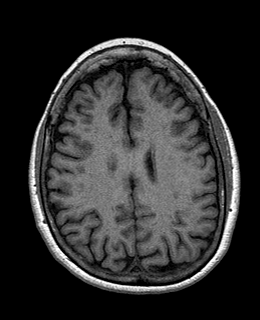
[im 112/144]
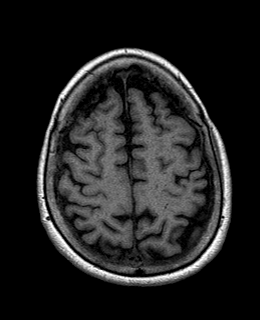
[im 128/144]
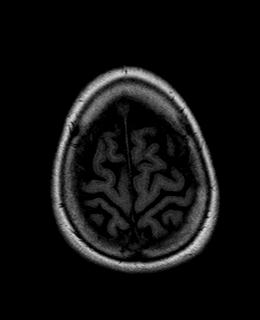
[im 144/144]
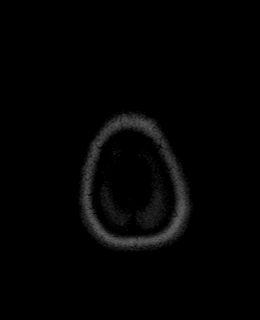

[Series 11: t2_tse_tra · axial · 5.0mm · 0.72mm/px · z∈[-41,+108]mm · 2 of 26 slices shown]
[im 1/26]
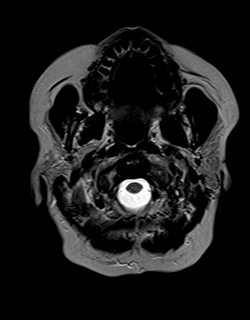
[im 26/26]
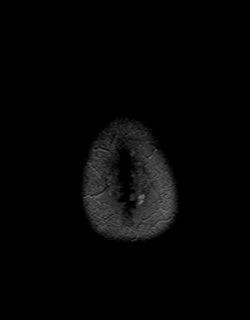

[Series 12: T2 · coronal · 5.0mm · 0.45mm/px · 2 of 26 slices shown]
[im 1/26]
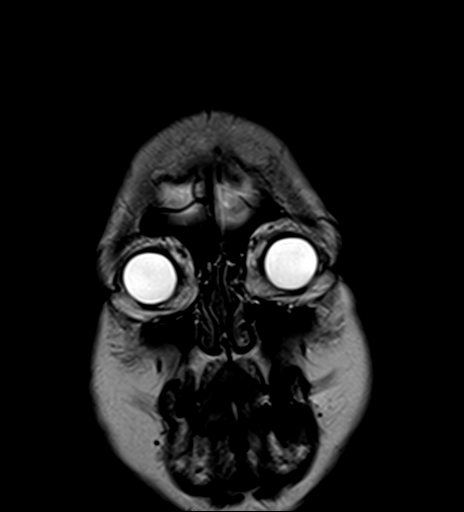
[im 26/26]
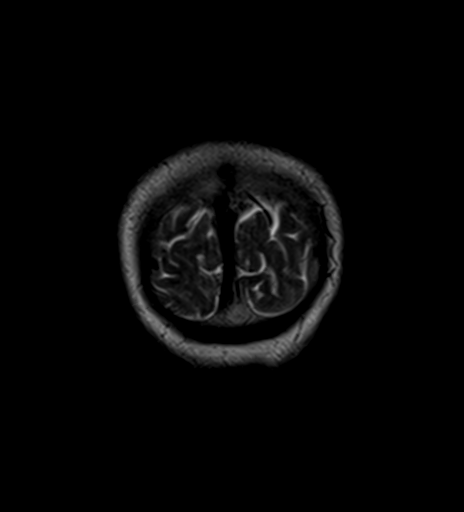

[Series 13: t1_mpr_tra · axial · 1.0mm · 0.72mm/px · z∈[-38,+104]mm · 10 of 144 slices shown (2 of 2)]
[im 1/144]
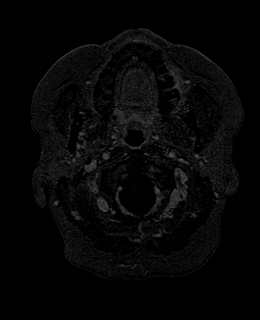
[im 16/144]
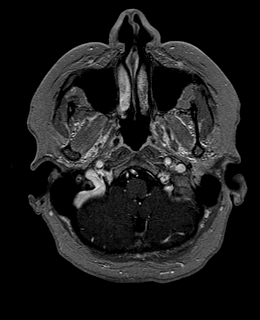
[im 32/144]
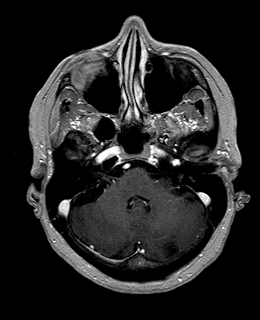
[im 48/144]
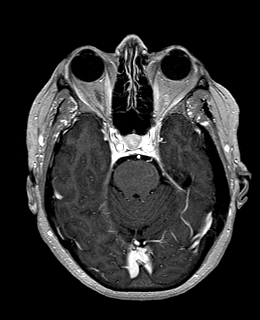
[im 64/144]
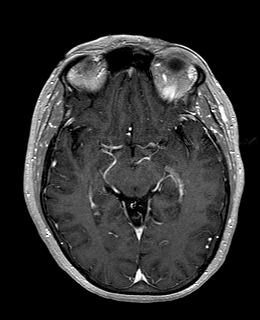
[im 80/144]
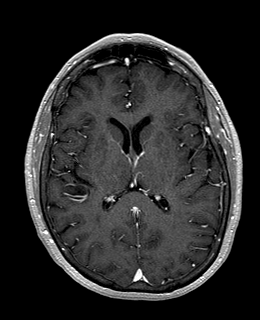
[im 96/144]
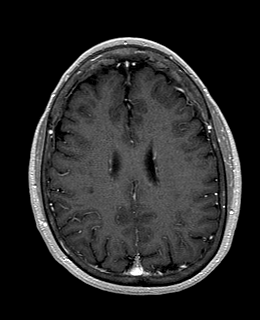
[im 112/144]
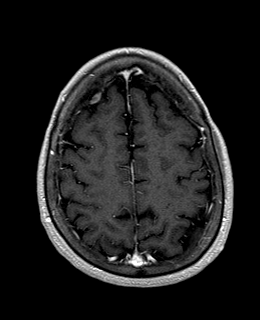
[im 128/144]
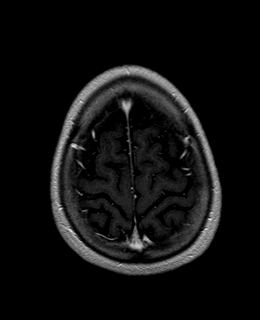
[im 144/144]
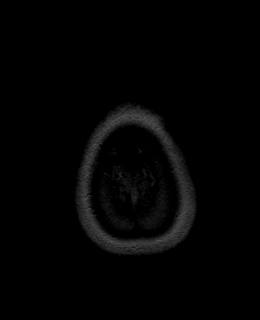

[Series 14: T1 post-contrast · coronal · 5.0mm · 0.72mm/px · 2 of 30 slices shown]
[im 1/30]
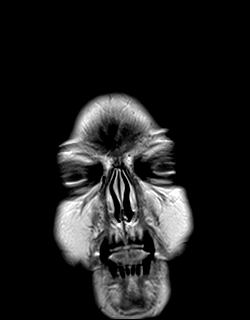
[im 30/30]
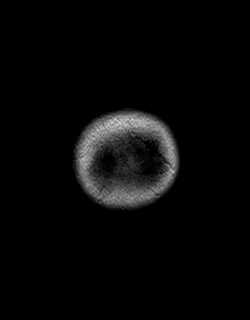

[Series 15: t1_se_sag · sagittal · 5.0mm · 0.45mm/px · 1 of 19 slices shown (2 of 2)]
[im 1/19]
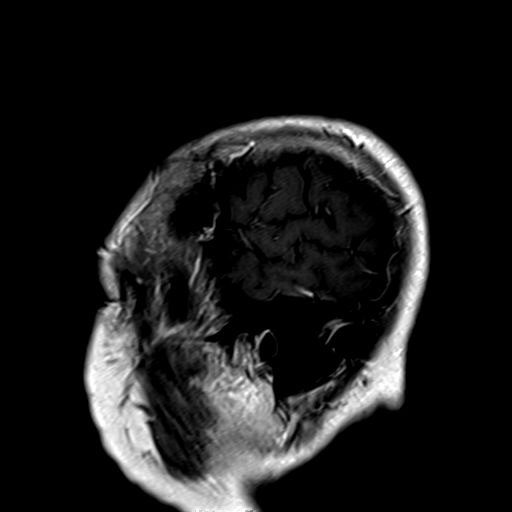

[48 of 48 positions shown; findings below may reference images not displayed]

FINDINGS: Brain: No acute infarction, hemorrhage, hydrocephalus, extra-axial
collection or mass lesion.

Scattered foci of T2 hyperintensity are seen within the white matter
of the cerebral hemispheres and within the pons, nonspecific, most
likely related to chronic small vessel ischemia.

No focus of abnormal contrast enhancement.

Vascular: Normal flow voids.

Skull and upper cervical spine: Normal marrow signal.

Sinuses/Orbits: Bilateral lens surgery. Paranasal sinuses are clear.
IMPRESSION: 1. No acute intracranial abnormality or abnormal contrast
enhancement.
2. Mild chronic small vessel ischemia.

## 2020-10-31 ENCOUNTER — Ambulatory Visit
Admission: RE | Admit: 2020-10-31 | Discharge: 2020-10-31 | Disposition: A | Discharge status: Home / Self Care | Payer: Medicare HMO | Source: Ambulatory Visit | Attending: Family Medicine | Admitting: Family Medicine

## 2020-10-31 ENCOUNTER — Other Ambulatory Visit: Payer: Self-pay

## 2020-10-31 DIAGNOSIS — R928 Other abnormal and inconclusive findings on diagnostic imaging of breast: Secondary | ICD-10-CM

## 2021-01-06 ENCOUNTER — Ambulatory Visit: Payer: Medicare HMO | Admitting: Cardiovascular Disease

## 2021-01-06 ENCOUNTER — Encounter: Payer: Self-pay | Admitting: Cardiovascular Disease

## 2021-01-06 ENCOUNTER — Other Ambulatory Visit: Payer: Self-pay

## 2021-01-06 VITALS — BP 122/70 | HR 70 | Ht 67.0 in | Wt 154.0 lb

## 2021-01-06 DIAGNOSIS — G43719 Chronic migraine without aura, intractable, without status migrainosus: Secondary | ICD-10-CM

## 2021-01-06 DIAGNOSIS — M329 Systemic lupus erythematosus, unspecified: Secondary | ICD-10-CM

## 2021-01-06 DIAGNOSIS — E785 Hyperlipidemia, unspecified: Secondary | ICD-10-CM | POA: Diagnosis not present

## 2021-01-06 DIAGNOSIS — Z8249 Family history of ischemic heart disease and other diseases of the circulatory system: Secondary | ICD-10-CM

## 2021-01-06 DIAGNOSIS — E039 Hypothyroidism, unspecified: Secondary | ICD-10-CM

## 2021-01-06 MED ORDER — ROSUVASTATIN CALCIUM 5 MG PO TABS: 5.0000 mg | ORAL_TABLET | Freq: Every day | ORAL | 3 refills | Status: DC

## 2021-01-06 NOTE — Patient Instructions (Signed)
Medication Instructions:  Your physician recommends that you continue on your current medications as directed. Please refer to the Current Medication list given to you today.  *If you need a refill on your cardiac medications before your next appointment, please call your pharmacy*   Lab Work: Please return for FASTING labs in 3 weeks (CMET, CBC, Lipid)  Our in office lab hours are Monday-Friday 8:00-4:00, closed for lunch 12:45-1:45 pm.  No appointment needed.  Follow-Up: At [facility], you and your health needs are our priority.  As part of our continuing mission to provide you with exceptional heart care, we have created designated Provider Care Teams.  These Care Teams include your primary Cardiologist (physician) and Advanced Practice Providers (APPs -  Physician Assistants and Nurse Practitioners) who all work together to provide you with the care you need, when you need it.  We recommend signing up for the patient portal called "MyChart".  Sign up information is provided on this After Visit Summary.  MyChart is used to connect with patients for Virtual Visits (Telemedicine).  Patients are able to view lab/test results, encounter notes, upcoming appointments, etc.  Non-urgent messages can be sent to your provider as well.   To learn more about what you can do with MyChart, go to [email or web address].    Your next appointment:   12 month(s)  The format for your next appointment:   In Person  Provider:   Shelva Majestic, MD

## 2021-01-06 NOTE — Progress Notes (Signed)
Patient ID: Brooke Williams, female   DOB: [birth year], 75 y.o.   MRN: [id number]     HPI: Brooke Williams is a 75 y.o. female who presents for a 10 month follow-up cardiology evaluation.   Brooke Williams has a history of lupus erythematosus, hyperlipidemia, and strong family history for premature coronary disease. 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 In the past I tried initiating therapy with Simcor 500/20 due to her lipid parameters but she was unable to tolerate this. Subsequently, we tried low-dose Crestor but again she stopped it secondary to development of vague myalgias.  She was diagnosed with endometrial cancer and underwent complete hysterectomy in January 2014.  Initially she did not require chemotherapy or radiation.  However, since I last saw her , she develop recurrence in 2015  and required surgery in February 2015.  She received paclitaxel and carboplatin chemotherapy.  She also has had several procedures for removal of lipoma from her lower back.  She is involved in clinical research study at [facility] evaluating the use of metformin for recurrent cancer.  When I  saw her in December 2016 her sister had recently undergone placement of 2 coronary stents and her mother's twin sister had just undergone CABG surgery. She had undergone previous left knee replacement several years ago.  She continues to be followed closely at [facility] and she brought with her recent results of her CT scans where she was found to have a stable right lower lobe 1.1 cm nodule.  There was no evidence for recurrent or metastatic disease in the abdomen or pelvis  I last saw her in May 2019 for preoperative clearance prior to undergoing right knee replacement with Dr. Frederik Pear. A  preoperative clearance  ECG showed small Q waves in 3 and nondiagnostic diminutive Q wave in aVF.  Because of her ECG she was referred to me today prior to undergoing surgery the following day.  I felt she was stable from a cardiac standpoint.  She underwent surgery successfully without cardiovascular compromise.  During that evaluation I noticed that she had had laboratory from her primary physician in November 2018 and her cholesterol was elevated at 226 with an LDL of 148.  She apparently was given a prescription for Crestor 5 mg but she never started this.  She developed the flu in January and then required surgery in February for rectal prolapse.    When I saw her in May 2019 I recommended initiation of Crestor. She started at 5 mg with plans to titrate to 10 as tolerated.  If she was unable to tolerate Crestor then Zetia was to be added and potentially she could be a candidate for PCSK9 inhibition.  She has tolerated Crestor 5 mg but never titrated this to 10 mg.  Most recent lipid studies this past week has shown marked improvement in LDL to 89.  However, she states that she stopped taking Crestor around the time of her surgery but several weeks ago reinstituted this and as result she believes her levels may actually be further improved.  She was recently seen by her primary physician and she is to start Fosamax.  She denies chest pain.  She denies palpitations.   I saw her in September 2019.  Apparently she had  not started the Crestor initially and waited until completed healing from knee surgery prior to instituting therapy.  I recommended increased activity.  She had repeat laboratory on January 31, 2019.  Total cholesterol was 166 triglycerides had improved from 150 down to 72 HDL was 57 and LDL 95.  She was tolerant she is tolerating Crestor 5 mg but never titrated up to 10 mg.  She denies any palpitations.  She has been on levothyroxine 75 mcg for hypothyroidism.   I saw her in March 2020 and last  evaluated her in October 2020 at which time she continued to do well and denied any chest pain or shortness of breath.  She did not tolerate trial of increasing Crestor to 10 mg, and therefore reduced her dose back to 5 mg.  She was still noticing some joint discomfort and as result in early August completely discontinued statin therapy.  She has had issues with headaches.  In the past she has seen Dr. Lajean Manes and is felt to have longstanding migraine headaches.  She sees Dr. Leslie Dales for her systemic lupus.  She underwent repeat laboratory on August 28, 2019.  Renal function was stable.  TSH was normal at 246.  Total cholesterol was 181, triglycerides 127, HDL 55, LDL 104.    I last evaluated her in a telemedicine visit in April 2021.  She had developed inner eye inflammation resulting in transient diplopia which had resolved.  She was unaware of any palpitations or chest pain.  With her history of statin induced myalgias she was only on Zetia and was no longer taking rosuvastatin.  LDL had improved to 83 from 114.  She continues to be on levothyroxine for hypothyroidism.  Her lupus was controlled.  I discussed resumption of exercise.  Since I last evaluated her, she states that she stopped taking Zetia because it made her feel tired.  For the last 2 months she resumed taking low-dose rosuvastatin at 5 mg daily and is seem to tolerate this.  She is no longer on Plaquenil for her lupus which has been in remission.  She has been eating gluten-free.  She is not had recent laboratory.  She presents for evaluation.  Past Medical History:  Diagnosis Date  . Arthritis    arthritis-hands. cervical disc degeneration  . Back pain    6th vertebrae has collapse on 7th,pinched nerve feeling occ  . Dizziness    rarely,takes Meclizine if needed  . Endometrial cancer ([place]) 2014  . GERD (gastroesophageal reflux disease)    controls with Zantac  . Headache(784.0)    headaches frequently but states since placed on  2L/m via N/C headaches have improved  . History of colon polyps    benign  . History of staph infection   . Hyperlipidemia    takes krill oil daily  . Hypothyroidism    takes Synthroid daily  . Ileus ([place]) 10/2015   was treated  . Joint pain   . Joint swelling   . Lupus ([place])    remission since 2008  . Nocturia   . OSA (obstructive sleep apnea)    sleep study in epic from 11-01-15.Uses Oxygen  . Pneumonia    hx of-within the last 15 yrs  . PONV (postoperative nausea and vomiting)   . Restless leg syndrome    takes Mirapex nightly  . Seasonal allergies    takes Allegra daily and uses Astelin Nasal Spray as needed    Past Surgical History:  Procedure Laterality  Date  . ABDOMINAL HYSTERECTOMY    . BRAIN SURGERY     left cataract  . COLON SURGERY     12/2017  . COLONOSCOPY    . DILATION AND CURETTAGE OF UTERUS     2008-polyp removed  . HAND SURGERY  2006   left thumb surgery with pin  . KNEE ARTHROPLASTY    . KNEE ARTHROSCOPY Left 04/08/2018   Procedure: ARTHROSCOPY LEFT KNEE;  Surgeon: Frederik Pear, MD;  Location: [facility];  Service: Orthopedics;  Laterality: Left;  . lipomas removed from back   40 yrs ago  . MASS EXCISION N/A 10/05/2014   Procedure: EXCISION OF BILATERAL LOWER BACK MASSES;  Surgeon: Coralie Keens, MD;  Location: [facility];  Service: General;  Laterality: N/A;  . MEDIAL PARTIAL KNEE REPLACEMENT  2005   LT knee  . OTHER SURGICAL HISTORY     metal pin/screw to repair arthritis in LT thumb joint  . ROBOTIC ASSISTED TOTAL HYSTERECTOMY WITH BILATERAL SALPINGO OOPHERECTOMY  12/20/2012   Procedure: ROBOTIC ASSISTED TOTAL HYSTERECTOMY WITH BILATERAL SALPINGO OOPHORECTOMY;  Surgeon: Imagene Gurney A. Alycia Rossetti, MD;  Location: [facility];  Service: Gynecology;  Laterality: N/A;  WITH PELVIC PARAAORTIC   . ROBOTIC PELVIC AND PARA-AORTIC LYMPH NODE DISSECTION  12/20/2012   Procedure: ROBOTIC PELVIC AND PARA-AORTIC LYMPH NODE DISSECTION;  Surgeon: Imagene Gurney A. Alycia Rossetti, MD;   Location: [facility];  Service: Gynecology;  Laterality: Bilateral;  . TOTAL KNEE ARTHROPLASTY Right 04/08/2018   Procedure: RIGHT TOTAL KNEE ARTHROPLASTY;  Surgeon: Frederik Pear, MD;  Location: [facility];  Service: Orthopedics;  Laterality: Right;  Right TKA and Left Knee Arthroscopy  . TOTAL KNEE REVISION Left 02/03/2016   Procedure: TOTAL KNEE REVISION;  Surgeon: Frederik Pear, MD;  Location: [facility];  Service: Orthopedics;  Laterality: Left;  . WRIST SURGERY Left 06/07/2019   Dr. Grandville Silos     Allergies  Allergen Reactions  . Cefdinir Hives, Itching and Rash  . Penicillins Hives    CHILDHOOD REACTION Has patient had a PCN reaction causing immediate rash, facial/tongue/throat swelling, SOB or lightheadedness with hypotension: #  #  NO  #  #  Has patient had a PCN reaction occurring within the last 10 years:#  #  #  YES  #  #  #   If all of the above answers are "NO", then may proceed with Cephalosporin use.  . Tape Itching and Rash    Skin turns red Bandaid  . Penicillamine Rash    Current Outpatient Medications  Medication Sig Dispense Refill  . Alpha-Lipoic Acid 600 MG CAPS Take by mouth daily.    Marland Kitchen CALCIUM PO Take 743 mg by mouth daily.    . diclofenac Sodium (VOLTAREN) 1 % GEL Apply topically.    . dicyclomine (BENTYL) 20 MG tablet Take by mouth.    . doxycycline (VIBRA-TABS) 100 MG tablet Take 1 tablet by mouth daily.    . famotidine (PEPCID) 20 MG tablet Take 20 mg by mouth as needed for heartburn or indigestion.    . fluocinonide gel (LIDEX) 0.05 % APPLY TO AFFECTED AREA twice a day as needed for canker sores.  1  . fluticasone (FLONASE) 50 MCG/ACT nasal spray Place into both nostrils 2 (two) times daily.    Marland Kitchen loratadine-pseudoephedrine (CLARITIN-D 12-HOUR) 5-120 MG tablet Take 1 tablet by mouth daily as needed for allergies.    . Multiple Vitamin (MULTIVITAMIN WITH MINERALS) TABS Take 1 tablet by mouth daily.    . Psyllium (METAMUCIL  FIBER PO) Take 5 capsules by mouth daily.     Marland Kitchen  SYNTHROID 75 MCG tablet Take 75 mcg by mouth daily before breakfast.     . triamcinolone (KENALOG) 0.1 % Apply topically.    . rosuvastatin (CRESTOR) 5 MG tablet Take 1 tablet (5 mg total) by mouth daily. 90 tablet 3   No current facility-administered medications for this visit.    Social History   Socioeconomic History  . Marital status: Married    Spouse name: Josph Macho  . Number of children: 0  . Years of education: 74  . Highest education level: High school graduate  Occupational History  . Occupation: retired  Tobacco Use  . Smoking status: Never Smoker  . Smokeless tobacco: Never Used  Vaping Use  . Vaping Use: Never used  Substance and Sexual Activity  . Alcohol use: Not Currently    Comment: rarely  . Drug use: No  . Sexual activity: Not Currently    Birth control/protection: Post-menopausal  Other Topics Concern  . Not on file  Social History Narrative   Patient does not have any biological children but has step children      Right handed   Two story    Lives with spouse   Social Determinants of Health   Financial Resource Strain: Not on file  Food Insecurity: Not on file  Transportation Needs: Not on file  Physical Activity: Not on file  Stress: Not on file  Social Connections: Not on file  Intimate Partner Violence: Not on file    Family History  Problem Relation Age of Onset  . Stroke Mother 66       arterial wall of artery in eye   . Endometrial cancer Mother 63  . Dementia Father   . Stroke Father        "lots of many strokes"  . Aneurysm Father 3       AAA  . Multiple births Maternal Grandmother        died 5 days after delivery of unknown causes  . Colon cancer Maternal Aunt        late 66s  . Cancer Cousin        maternal cousin with an unknown form of cancer  . Breast cancer Neg Hx    Additional social history is that I take care of her husband Mr. Felicia Bloomquist. There is no history of ever smoking tobacco. Occasional alcohol use.    ROS General: Negative; No fevers, chills, or night sweats;  HEENT: Negative; No changes in vision or hearing, sinus congestion, difficulty swallowing Pulmonary: Negative; No cough, wheezing, shortness of breath, hemoptysis Cardiovascular: Negative; No chest pain, presyncope, syncope, palpitations GI: Negative; No nausea, vomiting, diarrhea, or abdominal pain GU: Negative; No dysuria, hematuria, or difficulty voiding Musculoskeletal: status post successful knee surgery Hematologic/Oncology: Positive for endometrial cancer Endocrine: Negative; no heat/cold intolerance; no diabetes Neuro: Positive for history of migraine headaches Skin: Negative; No rashes or skin lesions Psychiatric: Negative; No behavioral problems, depression Sleep: Negative; No snoring, daytime sleepiness, hypersomnolence, bruxism, restless legs, hypnogognic hallucinations, no cataplexy Other comprehensive 14 point system review is negative.  PE BP 122/70 (BP Location: Left Arm, Patient Position: Sitting, Cuff Size: Normal)   Williams 70   Ht 5' 7"  (1.702 m)   Wt 154 lb (69.9 kg)   SpO2 98%   BMI 24.12 kg/m    Repeat blood pressure by me was 114/70  Wt Readings from Last 3 Encounters:  01/06/21 154  lb (69.9 kg)  03/19/20 165 lb (74.8 kg)  02/15/20 169 lb (76.7 kg)   General: Alert, oriented, no distress.  Skin: normal turgor, no rashes, warm and dry HEENT: Normocephalic, atraumatic. Pupils equal round and reactive to light; sclera anicteric; extraocular muscles intact;  Nose without nasal septal hypertrophy Mouth/Parynx benign; Mallinpatti scale 2 Neck: No JVD, no carotid bruits; normal carotid upstroke Lungs: clear to ausculatation and percussion; no wheezing or rales Chest wall: without tenderness to palpitation Heart: PMI not displaced, RRR, s1 s2 normal, 1/6 systolic murmur, no diastolic murmur, no rubs, gallops, thrills, or heaves Abdomen: soft, nontender; no hepatosplenomehaly, BS+; abdominal aorta  nontender and not dilated by palpation. Back: no CVA tenderness Pulses 2+ Musculoskeletal: full range of motion, normal strength, no joint deformities Extremities: no clubbing cyanosis or edema, Homan's sign negative  Neurologic: grossly nonfocal; Cranial nerves grossly wnl Psychologic: Normal mood and affect  ECG (independently read by me): NSR at 70; Q in III; no ectopy  October 2020  ECG (independently read by me): Normal sinus rhythm at 96 bpm.  Nonspecific ST changes.  Normal intervals.  No ectopy.  March 2020 ECG (independently read by me): Sinus rhythm at 85 bpm.  No ectopy.  Normal intervals.  September 2019 ECG (independently read by me): Normal sinus rhythm at 91 bpm.  Normal intervals.  No ectopy.  May 2019 ECG (independently read by me): Normal sinus rhythm at 84 bpm.  Possible borderline left atrial enlargement.  No ectopy. Normal intervals.  No evidence for prior MI.  Q waves in lead III.  Normal R waves in lead II and aVF  September 2016 ECG (independently read by me): Normal sinus rhythm at 74 bpm.  No ectopy.  Normal intervals.  ECG: Sinus rhythm at 69 beats per minute. No ectopy. Normal intervals.  LABS: BMP Latest Ref Rng & Units 03/14/2020 02/15/2020 08/28/2019  Glucose 65 - 99 mg/dL 88 95 96  BUN 8 - 27 mg/dL 15 22 15   Creatinine 0.57 - 1.00 mg/dL 0.74 1.03 0.80  BUN/Creat Ratio 12 - 28 20 - 19  Sodium 134 - 144 mmol/L 143 140 143  Potassium 3.5 - 5.2 mmol/L 4.3 4.3 5.1  Chloride 96 - 106 mmol/L 106 104 106  CO2 20 - 29 mmol/L 21 29 23   Calcium 8.7 - 10.3 mg/dL 9.3 9.8 9.6   Hepatic Function Latest Ref Rng & Units 03/14/2020 08/28/2019 01/31/2019  Total Protein 6.0 - 8.5 g/dL 6.4 6.5 6.4  Albumin 3.7 - 4.7 g/dL 4.2 4.4 4.5  AST 0 - 40 IU/L 28 28 27   ALT 0 - 32 IU/L 31 30 30   Alk Phosphatase 39 - 117 IU/L 113 103 91  Total Bilirubin 0.0 - 1.2 mg/dL 0.2 0.3 0.3   CBC Latest Ref Rng & Units 02/15/2020 08/28/2019 04/09/2018  WBC 4.0 - 10.5 K/uL 6.7 7.3 9.4   Hemoglobin 12.0 - 15.0 g/dL 14.1 14.3 11.1(L)  Hematocrit 36.0 - 46.0 % 41.9 42.4 34.1(L)  Platelets 150.0 - 400.0 K/uL 258.0 239 184   Lab Results  Component Value Date   MCV 91.3 02/15/2020   MCV 92 08/28/2019   MCV 92.9 04/09/2018   Lab Results  Component Value Date   TSH 1.46 02/15/2020    No results found for: HGBA1C   Lipid Panel     Component Value Date/Time   CHOL 154 03/14/2020 0902   TRIG 118 03/14/2020 0902   HDL 50 03/14/2020 0902   CHOLHDL 3.1 03/14/2020 0902  CHOLHDL 3.7 09/06/2015 0914   VLDL 32 (H) 09/06/2015 0914   LDLCALC 83 03/14/2020 0902     RADIOLOGY: No results found.  Prior  2016 NMR lipoprotein: significant increase LDL particle #2251, LDL calculated 152, HDL 54, triglycerides 180, total cholesterol 242, small LDL particle #1427, and insulin resistance coronary increased at 49.   IMPRESSION:  1. Family history of premature CAD   2. Hyperlipidemia LDL goal <70   3. Hypothyroidism, unspecified type   4. Intractable chronic migraine without aura and without status migrainosus   5. Lupus ([place])     ASSESSMENT AND PLAN: Brooke Williams is a 75 year-old female who has a strong family history for premature coronary artery disease with several family members having undergone cardiovascular interventions.  She has a history of hyperlipidemia and has had issues of statin induced myalgia.  She was able to be reinstituted and tolerated Crestor 5 mg but apparently did not seem to tolerate the increased regimen to 10 mg.  As result subsequently she had reduced her dose back to 5 mg and since August 2020 discontinue therapy altogether.  On subsequent evaluation she had been started on Zetia which she initially tolerated.  Apparently since I last saw her over the past several months she felt that Zetia was contributing to fatigability.  She has since gone back on low-dose rosuvastatin 5 mg which she is tolerated.  She has not had recent laboratory.  Her  blood pressure today is stable.  She is on levothyroxine 75 mcg for hypothyroidism.  She also was recently started on tizanidine 2 mg by Dr. Mayer Camel for muscle tension headaches.  Her lupus has been in remission since 2008.  She feels better since having a gluten-free diet.  I have recommended follow-up laboratory with a comprehensive metabolic panel, CBC and lipid studies.  I will contact her regarding results and if adjustments need to be made to her medical regimen.  As long as she is stable, I will see her in 1 year for reevaluation.   Troy Sine, MD, [facility]  01/12/2021 3:19 PM

## 2021-01-12 ENCOUNTER — Encounter: Payer: Self-pay | Admitting: Cardiovascular Disease

## 2021-01-28 ENCOUNTER — Ambulatory Visit
Admission: RE | Admit: 2021-01-28 | Discharge: 2021-01-28 | Disposition: A | Discharge status: Home / Self Care | Payer: Medicare HMO | Source: Ambulatory Visit | Attending: Family Medicine | Admitting: Family Medicine

## 2021-01-28 ENCOUNTER — Other Ambulatory Visit: Payer: Self-pay | Admitting: Family Medicine

## 2021-01-28 ENCOUNTER — Other Ambulatory Visit: Payer: Self-pay

## 2021-01-28 DIAGNOSIS — N6489 Other specified disorders of breast: Secondary | ICD-10-CM

## 2021-01-28 DIAGNOSIS — N63 Unspecified lump in unspecified breast: Secondary | ICD-10-CM

## 2021-01-28 DIAGNOSIS — M8589 Other specified disorders of bone density and structure, multiple sites: Secondary | ICD-10-CM

## 2021-02-10 LAB — LIPID PANEL
Chol/HDL Ratio: 3 ratio (ref 0.0–4.4)
Cholesterol, Total: 175 mg/dL (ref 100–199)
HDL: 59 mg/dL (ref >39)
LDL Chol Calc (NIH): 98 mg/dL (ref 0–99)
Triglycerides: 100 mg/dL (ref 0–149)
VLDL Cholesterol Cal: 18 mg/dL (ref 5–40)

## 2021-02-10 LAB — CBC
Hematocrit: 41.9 % (ref 34.0–46.6)
Hemoglobin: 14.1 g/dL (ref 11.1–15.9)
MCH: 30.9 pg (ref 26.6–33.0)
MCHC: 33.7 g/dL (ref 31.5–35.7)
MCV: 92 fL (ref 79–97)
Platelets: 211 10*3/uL (ref 150–450)
RBC: 4.57 x10E6/uL (ref 3.77–5.28)
RDW: 12.8 % (ref 11.7–15.4)
WBC: 6.6 10*3/uL (ref 3.4–10.8)

## 2021-02-10 LAB — COMPREHENSIVE METABOLIC PANEL
ALT: 26 IU/L (ref 0–32)
AST: 26 IU/L (ref 0–40)
Albumin/Globulin Ratio: 2.4 — ABNORMAL HIGH (ref 1.2–2.2)
Albumin: 4.3 g/dL (ref 3.7–4.7)
Alkaline Phosphatase: 105 IU/L (ref 44–121)
BUN/Creatinine Ratio: 21 (ref 12–28)
BUN: 15 mg/dL (ref 8–27)
Bilirubin Total: 0.2 mg/dL (ref 0.0–1.2)
CO2: 22 mmol/L (ref 20–29)
Calcium: 9.2 mg/dL (ref 8.7–10.3)
Chloride: 105 mmol/L (ref 96–106)
Creatinine, Ser: 0.72 mg/dL (ref 0.57–1.00)
Globulin, Total: 1.8 g/dL (ref 1.5–4.5)
Glucose: 90 mg/dL (ref 65–99)
Potassium: 4.9 mmol/L (ref 3.5–5.2)
Sodium: 141 mmol/L (ref 134–144)
Total Protein: 6.1 g/dL (ref 6.0–8.5)
eGFR: 89 mL/min/{1.73_m2} (ref >59)

## 2021-05-05 ENCOUNTER — Other Ambulatory Visit: Payer: Self-pay

## 2021-05-05 ENCOUNTER — Ambulatory Visit
Admission: RE | Admit: 2021-05-05 | Discharge: 2021-05-05 | Disposition: A | Discharge status: Home / Self Care | Payer: Medicare HMO | Source: Ambulatory Visit | Attending: Family Medicine | Admitting: Family Medicine

## 2021-05-05 DIAGNOSIS — N6489 Other specified disorders of breast: Secondary | ICD-10-CM

## 2021-10-03 ENCOUNTER — Other Ambulatory Visit: Payer: Self-pay | Admitting: Family Medicine

## 2021-10-03 DIAGNOSIS — R160 Hepatomegaly, not elsewhere classified: Secondary | ICD-10-CM

## 2021-10-03 DIAGNOSIS — Z9049 Acquired absence of other specified parts of digestive tract: Secondary | ICD-10-CM

## 2021-10-03 DIAGNOSIS — M3219 Other organ or system involvement in systemic lupus erythematosus: Secondary | ICD-10-CM

## 2021-10-03 DIAGNOSIS — R188 Other ascites: Secondary | ICD-10-CM

## 2021-10-03 DIAGNOSIS — Z1231 Encounter for screening mammogram for malignant neoplasm of breast: Secondary | ICD-10-CM

## 2021-10-03 DIAGNOSIS — R29898 Other symptoms and signs involving the musculoskeletal system: Secondary | ICD-10-CM

## 2021-10-03 DIAGNOSIS — K760 Fatty (change of) liver, not elsewhere classified: Secondary | ICD-10-CM

## 2021-10-03 DIAGNOSIS — Z8542 Personal history of malignant neoplasm of other parts of uterus: Secondary | ICD-10-CM

## 2021-10-22 ENCOUNTER — Ambulatory Visit
Admission: RE | Admit: 2021-10-22 | Discharge: 2021-10-22 | Disposition: A | Discharge status: Home / Self Care | Payer: Medicare HMO | Source: Ambulatory Visit | Attending: Family Medicine | Admitting: Family Medicine

## 2021-10-22 DIAGNOSIS — Z8542 Personal history of malignant neoplasm of other parts of uterus: Secondary | ICD-10-CM

## 2021-10-22 DIAGNOSIS — M3219 Other organ or system involvement in systemic lupus erythematosus: Secondary | ICD-10-CM

## 2021-10-22 DIAGNOSIS — K760 Fatty (change of) liver, not elsewhere classified: Secondary | ICD-10-CM

## 2021-10-22 DIAGNOSIS — Z9049 Acquired absence of other specified parts of digestive tract: Secondary | ICD-10-CM

## 2021-10-22 DIAGNOSIS — R29898 Other symptoms and signs involving the musculoskeletal system: Secondary | ICD-10-CM

## 2021-10-22 DIAGNOSIS — R188 Other ascites: Secondary | ICD-10-CM

## 2021-10-22 DIAGNOSIS — R160 Hepatomegaly, not elsewhere classified: Secondary | ICD-10-CM

## 2021-10-22 MED ORDER — IOPAMIDOL (ISOVUE-300) INJECTION 61%
100.0000 mL | Freq: Once | INTRAVENOUS | Status: AC | PRN
  Administered 2021-10-22: 100 mL via INTRAVENOUS

## 2021-11-06 ENCOUNTER — Other Ambulatory Visit: Payer: Self-pay

## 2021-11-06 ENCOUNTER — Ambulatory Visit
Admission: RE | Admit: 2021-11-06 | Discharge: 2021-11-06 | Disposition: A | Discharge status: Home / Self Care | Payer: Medicare HMO | Source: Ambulatory Visit | Attending: Family Medicine | Admitting: Family Medicine

## 2021-11-06 DIAGNOSIS — Z1231 Encounter for screening mammogram for malignant neoplasm of breast: Secondary | ICD-10-CM

## 2022-02-12 ENCOUNTER — Encounter: Payer: Self-pay | Admitting: Cardiovascular Disease

## 2022-02-12 ENCOUNTER — Other Ambulatory Visit: Payer: Self-pay

## 2022-02-12 ENCOUNTER — Ambulatory Visit: Payer: Medicare HMO | Admitting: Cardiovascular Disease

## 2022-02-12 DIAGNOSIS — IMO0002 Reserved for concepts with insufficient information to code with codable children: Secondary | ICD-10-CM

## 2022-02-12 DIAGNOSIS — E039 Hypothyroidism, unspecified: Secondary | ICD-10-CM

## 2022-02-12 DIAGNOSIS — Z8249 Family history of ischemic heart disease and other diseases of the circulatory system: Secondary | ICD-10-CM

## 2022-02-12 DIAGNOSIS — M329 Systemic lupus erythematosus, unspecified: Secondary | ICD-10-CM

## 2022-02-12 DIAGNOSIS — E785 Hyperlipidemia, unspecified: Secondary | ICD-10-CM | POA: Diagnosis not present

## 2022-02-12 LAB — LIPID PANEL
Chol/HDL Ratio: 2.3 ratio (ref 0.0–4.4)
Cholesterol, Total: 149 mg/dL (ref 100–199)
HDL: 66 mg/dL (ref >39)
LDL Chol Calc (NIH): 64 mg/dL (ref 0–99)
Triglycerides: 108 mg/dL (ref 0–149)
VLDL Cholesterol Cal: 19 mg/dL (ref 5–40)

## 2022-02-12 LAB — CBC
Hematocrit: 41.6 % (ref 34.0–46.6)
Hemoglobin: 13.7 g/dL (ref 11.1–15.9)
MCH: 30.9 pg (ref 26.6–33.0)
MCHC: 32.9 g/dL (ref 31.5–35.7)
MCV: 94 fL (ref 79–97)
Platelets: 226 10*3/uL (ref 150–450)
RBC: 4.44 x10E6/uL (ref 3.77–5.28)
RDW: 12.3 % (ref 11.7–15.4)
WBC: 6.9 10*3/uL (ref 3.4–10.8)

## 2022-02-12 LAB — COMPREHENSIVE METABOLIC PANEL
ALT: 29 IU/L (ref 0–32)
AST: 23 IU/L (ref 0–40)
Albumin/Globulin Ratio: 2.3 — ABNORMAL HIGH (ref 1.2–2.2)
Albumin: 4.4 g/dL (ref 3.7–4.7)
Alkaline Phosphatase: 87 IU/L (ref 44–121)
BUN/Creatinine Ratio: 18 (ref 12–28)
BUN: 14 mg/dL (ref 8–27)
Bilirubin Total: 0.3 mg/dL (ref 0.0–1.2)
CO2: 27 mmol/L (ref 20–29)
Calcium: 9.5 mg/dL (ref 8.7–10.3)
Chloride: 106 mmol/L (ref 96–106)
Creatinine, Ser: 0.76 mg/dL (ref 0.57–1.00)
Globulin, Total: 1.9 g/dL (ref 1.5–4.5)
Glucose: 86 mg/dL (ref 70–99)
Potassium: 4.5 mmol/L (ref 3.5–5.2)
Sodium: 144 mmol/L (ref 134–144)
Total Protein: 6.3 g/dL (ref 6.0–8.5)
eGFR: 83 mL/min/{1.73_m2} (ref >59)

## 2022-02-12 MED ORDER — ROSUVASTATIN CALCIUM 5 MG PO TABS: 5.0000 mg | ORAL_TABLET | Freq: Every day | ORAL | 3 refills | Status: DC

## 2022-02-12 NOTE — Patient Instructions (Signed)
Medication Instructions:  ?Your physician recommends that you continue on your current medications as directed. Please refer to the Current Medication list given to you today. ? ?*If you need a refill on your cardiac medications before your next appointment, please call your pharmacy* ? ? ?Lab Work: ?Your physician recommends that you have labs drawn today: CMET, CBC, Lipids ? ?If you have labs (blood work) drawn today and your tests are completely normal, you will receive your results only by: ?MyChart Message (if you have MyChart) OR ?A paper copy in the mail ?If you have any lab test that is abnormal or we need to change your treatment, we will call you to review the results. ? ? ?Follow-Up: ?At [facility], you and your health needs are our priority.  As part of our continuing mission to provide you with exceptional heart care, we have created designated Provider Care Teams.  These Care Teams include your primary Cardiologist (physician) and Advanced Practice Providers (APPs -  Physician Assistants and Nurse Practitioners) who all work together to provide you with the care you need, when you need it. ? ?We recommend signing up for the patient portal called "MyChart".  Sign up information is provided on this After Visit Summary.  MyChart is used to connect with patients for Virtual Visits (Telemedicine).  Patients are able to view lab/test results, encounter notes, upcoming appointments, etc.  Non-urgent messages can be sent to your provider as well.   ?To learn more about what you can do with MyChart, go to [email or web address].   ? ?Your next appointment:   ?12 month(s) ? ?The format for your next appointment:   ?In Person ? ?Provider:   ?Shelva Majestic, MD ?

## 2022-02-12 NOTE — Progress Notes (Signed)
Patient ID: Brooke Williams, female   DOB: 08/14/1946, 75 y.o.   MRN: [id number] ? ? ? ? ?HPI: Brooke Williams is a 76 y.o. female who presents for a 13 month follow-up cardiology evaluation.  ? ?Ms. Lieurance has a history of lupus erythematosus, hyperlipidemia, and strong family history for premature coronary disease. 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 In the past I tried initiating therapy with Simcor 500/20 due to her lipid parameters but she was unable to tolerate this. Subsequently, we tried low-dose Crestor but again she stopped it secondary to development of vague myalgias. ? ?She was diagnosed with endometrial cancer and underwent complete hysterectomy in January 2014.  Initially she did not require chemotherapy or radiation.  However, since I last saw her , she develop recurrence in 2015  and required surgery in February 2015.  She received paclitaxel and carboplatin chemotherapy.  She also has had several procedures for removal of lipoma from her lower back.  She is involved in clinical research study at [facility] evaluating the use of metformin for recurrent cancer. ? ?When I saw her in December 2016 her sister had recently undergone placement of 2 coronary stents and her mother's twin sister had just undergone CABG surgery. She had undergone previous left knee replacement several years ago.  She continues to be followed closely at [facility] and she brought with her recent results of her CT scans where she was found to have a stable right lower lobe 1.1 cm nodule.  There was no evidence for recurrent or metastatic disease in the abdomen or pelvis ? ?I saw her in May 2019 for preoperative clearance prior to undergoing right knee replacement with Dr. Frederik Pear. A  preoperative clearance  ECG showed small Q waves in 3 and nondiagnostic diminutive Q wave in aVF.  Because of her ECG she was referred to me today prior to undergoing surgery the following day.  I felt she was stable from a cardiac standpoint.  She underwent surgery successfully without cardiovascular compromise.  During that evaluation I noticed that she had had laboratory from her primary physician in November 2018 and her cholesterol was elevated at 226 with an LDL of 148.  She apparently was given a prescription for Crestor 5 mg but she never started this.  She developed the flu in January and then required surgery in February for rectal prolapse.   ? ?When I saw her in May 2019 I recommended initiation of Crestor. She started at 5 mg with plans to titrate to 10 as tolerated.  If she was unable to tolerate Crestor then Zetia was to be added and potentially she could be a candidate for PCSK9 inhibition.  She has tolerated Crestor 5 mg but never titrated this to 10 mg.  Most recent lipid studies this past week has shown marked improvement in LDL to 89.  However, she states that she stopped taking Crestor around the time of her surgery but several weeks ago reinstituted this and as result she believes her levels may actually be further improved.  She was recently seen by her primary physician and she is to start Fosamax.  She denies chest pain.  She denies palpitations.  ? ?I saw her in September 2019.  Apparently she had  not started the Crestor initially and waited until completed healing from knee surgery prior to instituting therapy.  I recommended increased activity.  She had repeat laboratory on January 31, 2019.  Total cholesterol was 166 triglycerides had improved from 150 down to 72 HDL was 57 and LDL 95.  She was tolerant she is tolerating Crestor 5 mg but never titrated up to 10 mg.  She denies any palpitations.  She has been on levothyroxine 75 mcg for hypothyroidism.  ? ?I saw her in March 2020 and last  evaluated her in October 2020 at which time she continued to do well and denied any chest pain or shortness of breath.  She did not tolerate trial of increasing Crestor to 10 mg, and therefore reduced her dose back to 5 mg.  She was still noticing some joint discomfort and as result in early August completely discontinued statin therapy.  She has had issues with headaches.  In the past she has seen Dr. Lajean Manes and is felt to have longstanding migraine headaches.  She sees Dr. Leslie Dales for her systemic lupus.  She underwent repeat laboratory on August 28, 2019.  Renal function was stable.  TSH was normal at 246.  Total cholesterol was 181, triglycerides 127, HDL 55, LDL 104.   ? ?I evaluated her in a telemedicine visit in April 2021.  She had developed inner eye inflammation resulting in transient diplopia which had resolved.  She was unaware of any palpitations or chest pain.  With her history of statin induced myalgias she was only on Zetia and was no longer taking rosuvastatin.  LDL had improved to 83 from 114.  She continues to be on levothyroxine for hypothyroidism.  Her lupus was controlled.  I discussed resumption of exercise. ? ?I last saw her on January 06, 2021 and since her prior evaluation she had stopped taking Zetia because she felt it made her tired.  For the last 2 months she resumed taking low-dose rosuvastatin at 5 mg daily and is seem to tolerate this.  She is no longer on Plaquenil for her lupus which has been in remission.  She has been eating gluten-free.  She is not had recent laboratory.  ? ?Since I last saw her, she continues to be followed by Dr. Amil Amen for her lupus.  Laboratory in March 2022 showed total cholesterol 175 HDL 59 LDL 98 and triglycerides 100.  Serum creatinine in September 2022 was 0.74.  She is now back on Plaquenil 100 mg twice a day for her lupus.  She continues to be on rosuvastatin 5 mg for hyperlipidemia and Synthroid 75 mcg for hypothyroidism.  She takes famotidine  as needed for dyspepsia.  She presents for evaluation. ? ?Past Medical History:  ?Diagnosis Date  ? Arthritis   ? arthritis-hands. cervical disc degeneration  ? Back pain   ? 6th vertebrae has collapse on 7th,pinched nerve feeling occ  ? Dizziness   ? rarely,takes Meclizine if needed  ? Endometrial cancer ([place]) 2014  ? GERD (gastroesophageal reflux disease)   ? controls with Zantac  ? Headache(784.0)   ? headaches frequently but states since placed on 2L/m via N/C headaches have improved  ? History of colon polyps   ? benign  ? History of staph infection   ? Hyperlipidemia   ? takes krill oil daily  ? Hypothyroidism   ? takes Synthroid daily  ? Ileus ([place]) 10/2015  ? was treated  ? Joint pain   ? Joint swelling   ?  Lupus ([place])   ? remission since 2008  ? Nocturia   ? OSA (obstructive sleep apnea)   ? sleep study in epic from 11-01-15.Uses Oxygen  ? Pneumonia   ? hx of-within the last 15 yrs  ? PONV (postoperative nausea and vomiting)   ? Restless leg syndrome   ? takes Mirapex nightly  ? Seasonal allergies   ? takes Allegra daily and uses Astelin Nasal Spray as needed  ? ? ?Past Surgical History:  ?Procedure Laterality Date  ? ABDOMINAL HYSTERECTOMY    ? BRAIN SURGERY    ? left cataract  ? COLON SURGERY    ? 12/2017  ? COLONOSCOPY    ? DILATION AND CURETTAGE OF UTERUS    ? 2008-polyp removed  ? HAND SURGERY  2006  ? left thumb surgery with pin  ? KNEE ARTHROPLASTY    ? KNEE ARTHROSCOPY Left 04/08/2018  ? Procedure: ARTHROSCOPY LEFT KNEE;  Surgeon: Frederik Pear, MD;  Location: [facility];  Service: Orthopedics;  Laterality: Left;  ? lipomas removed from back   40 yrs ago  ? MASS EXCISION N/A 10/05/2014  ? Procedure: EXCISION OF BILATERAL LOWER BACK MASSES;  Surgeon: Coralie Keens, MD;  Location: [facility];  Service: General;  Laterality: N/A;  ? MEDIAL PARTIAL KNEE REPLACEMENT  2005  ? LT knee  ? OTHER SURGICAL HISTORY    ? metal pin/screw to repair arthritis in LT thumb joint  ? ROBOTIC ASSISTED TOTAL  HYSTERECTOMY WITH BILATERAL SALPINGO OOPHERECTOMY  12/20/2012  ? Procedure: ROBOTIC ASSISTED TOTAL HYSTERECTOMY WITH BILATERAL SALPINGO OOPHORECTOMY;  Surgeon: Imagene Gurney A. Alycia Rossetti, MD;  Location: [facility];  Service: [place]

## 2022-02-13 ENCOUNTER — Encounter: Payer: Self-pay | Admitting: Cardiovascular Disease

## 2022-09-15 ENCOUNTER — Encounter (HOSPITAL_BASED_OUTPATIENT_CLINIC_OR_DEPARTMENT_OTHER): Payer: Self-pay | Admitting: Emergency Medicine

## 2022-09-15 ENCOUNTER — Other Ambulatory Visit: Payer: Self-pay

## 2022-09-15 ENCOUNTER — Emergency Department (HOSPITAL_BASED_OUTPATIENT_CLINIC_OR_DEPARTMENT_OTHER)
Admission: EM | Admit: 2022-09-15 | Discharge: 2022-09-15 | Disposition: A | Discharge status: Home / Self Care | Payer: Medicare HMO | Attending: Emergency Medicine | Admitting: Emergency Medicine

## 2022-09-15 DIAGNOSIS — S61012A Laceration without foreign body of left thumb without damage to nail, initial encounter: Secondary | ICD-10-CM | POA: Insufficient documentation

## 2022-09-15 DIAGNOSIS — Y93G9 Activity, other involving cooking and grilling: Secondary | ICD-10-CM | POA: Diagnosis not present

## 2022-09-15 DIAGNOSIS — W260XXA Contact with knife, initial encounter: Secondary | ICD-10-CM | POA: Insufficient documentation

## 2022-09-15 MED ORDER — LIDOCAINE HCL 2 % IJ SOLN
10.0000 mL | Freq: Once | INTRAMUSCULAR | Status: DC
  Filled 2022-09-15: qty 20

## 2022-09-15 NOTE — ED Notes (Signed)
Pt discharged home after verbalizing understanding of discharge instructions; nad noted. 

## 2022-09-15 NOTE — Discharge Instructions (Addendum)
Your stitches need to come out in 7 to 10 days.  Your primary care team may take them out, any urgent care or you may come back to the emergency department.  Do not scrub your hands but you may wash them with mild water.  Avoid lotions and body oils that you may use bacitracin or Neosporin on your wound.  Keep it covered when you are doing dirty activities however otherwise you may let it breathe.  Return with any especially signs of infection such as yellow drainage, swelling and redness of the area or any further concerns.  It was a pleasure to meet you and I hope you feel better!

## 2022-09-15 NOTE — ED Triage Notes (Signed)
Pt arrived POV. Pt caox4 and ambulatory. Pt reports at approx 1245 she cut her L. Thumb with a kitchen knife. Bleeding controlled PTA. Bacitracin and bandage applied in triage.

## 2022-09-15 NOTE — ED Provider Notes (Signed)
[facility] EMERGENCY DEPT Provider Note   CSN: [id number] Arrival date & time: 09/15/22  1314     History  Chief Complaint  Patient presents with   Laceration    Brooke Williams is a 76 y.o. female presenting with a laceration to the left thumb.  Reports that she was cutting vegetables when the knife slipped and cut the distal part of her.  Tetanus is up-to-date.  Knife was clean.  Numbness or tingling.   Laceration      Home Medications Prior to Admission medications   Medication Sig Start Date End Date Taking? Authorizing Provider  Alpha-Lipoic Acid 600 MG CAPS Take by mouth daily.    [provider]  CALCIUM PO Take 743 mg by mouth daily.    [provider]  diclofenac Sodium (VOLTAREN) 1 % GEL Apply topically.    [provider]  dicyclomine (BENTYL) 20 MG tablet Take by mouth. 03/27/20 03/27/21  [provider]  doxycycline (VIBRA-TABS) 100 MG tablet Take 1 tablet by mouth daily. 11/28/18   [provider]  famotidine (PEPCID) 20 MG tablet Take 20 mg by mouth as needed for heartburn or indigestion.    [provider]  fluocinonide gel (LIDEX) 0.05 % APPLY TO AFFECTED AREA twice a day as needed for canker sores. 08/21/15   [provider]  fluticasone (FLONASE) 50 MCG/ACT nasal spray Place into both nostrils 2 (two) times daily.    [provider]  loratadine-pseudoephedrine (CLARITIN-D 12-HOUR) 5-120 MG tablet Take 1 tablet by mouth daily as needed for allergies.    [provider]  Multiple Vitamin (MULTIVITAMIN WITH MINERALS) TABS Take 1 tablet by mouth daily.    [provider]  Psyllium (METAMUCIL FIBER PO) Take 5 capsules by mouth daily.     [provider]  rosuvastatin (CRESTOR) 5 MG tablet Take 1 tablet (5 mg total) by mouth daily. 02/12/22   Troy Sine, MD  SYNTHROID 75 MCG tablet Take 75 mcg by mouth daily before breakfast.  10/11/15   [provider]  triamcinolone (KENALOG) 0.1 % Apply topically.    [provider]      Allergies    Cefdinir, Penicillins, Tape, and Penicillamine    Review of Systems   Review of Systems  Physical Exam Updated Vital Signs BP (!) 140/100 (BP Location: Right Arm)   Pulse 91   Temp 98.8 F (37.1 C) (Oral)   Resp 16   Ht '5\' 7"'$  (1.702 m)   Wt 68 kg   SpO2 99%   BMI 23.49 kg/m  Physical Exam Vitals and nursing note reviewed.  Constitutional:      Appearance: Normal appearance.  HENT:     Head: Normocephalic and atraumatic.  Eyes:     General: No scleral icterus.    Conjunctiva/sclera: Conjunctivae normal.  Pulmonary:     Effort: Pulmonary effort is normal. No respiratory distress.  Skin:    Findings: No rash.     Comments: 2 cm laceration to the volar surface of the left thumb.  No nailbed involvement.  Strong radial pulse, sensation intact and range of motion intact in all joints.  No visualized foreign body  Neurological:     Mental Status: She is alert.  Psychiatric:        Mood and Affect: Mood normal.     ED Results / Procedures / Treatments   Labs (all labs ordered are listed, but only abnormal results are displayed) Labs Reviewed -  No data to display  EKG None  Radiology No results found.  Procedures .Marland KitchenLaceration Repair  Date/Time: 09/15/2022 3:19 PM  Performed by: Rhae Hammock, PA-C Authorized by: Rhae Hammock, PA-C   Consent:    Consent obtained:  Verbal   Consent given by:  Patient   Risks discussed:  Infection, pain, need for additional repair, poor wound healing, nerve damage and poor cosmetic result Universal protocol:    Patient identity confirmed:  Verbally with patient Anesthesia:    Anesthesia method:  Nerve block Laceration details:    Location:  Finger   Finger location:  L thumb   Length (cm):  2 Exploration:    Hemostasis achieved with:  Direct pressure   Imaging outcome: foreign body not noted       Medications Ordered in ED Medications  lidocaine (XYLOCAINE) 2 % (with pres) injection 200 mg (has no administration in time range)    ED Course/ Medical Decision Making/ A&P                           Medical Decision Making Risk Prescription drug management.   76 year old female presenting today after a laceration while cooking.  Tetanus was up-to-date.  2 cm laceration repaired after digital block.  Patient tolerated this well.  Will be discharged home with instructions on suture removal in 7 days.   Final Clinical Impression(s) / ED Diagnoses Final diagnoses:  Laceration of left thumb without damage to nail, foreign body presence unspecified, initial encounter    Rx / DC Orders ED Discharge Orders     None      Results and diagnoses were explained to the patient. Return precautions discussed in full. Patient had no additional questions and expressed complete understanding.   This chart was dictated using voice recognition software.  Despite best efforts to proofread,  errors can occur which can change the documentation meaning.     Rhae Hammock, PA-C 09/15/22 [place], [place], DO 09/15/22 1610

## 2022-10-05 ENCOUNTER — Other Ambulatory Visit: Payer: Self-pay | Admitting: Family Medicine

## 2022-10-05 DIAGNOSIS — Z1231 Encounter for screening mammogram for malignant neoplasm of breast: Secondary | ICD-10-CM

## 2022-11-10 ENCOUNTER — Other Ambulatory Visit: Payer: Self-pay | Admitting: Neurosurgery

## 2022-11-10 DIAGNOSIS — R519 Headache, unspecified: Secondary | ICD-10-CM

## 2022-11-11 ENCOUNTER — Other Ambulatory Visit: Payer: Self-pay | Admitting: Internal Medicine

## 2022-11-11 DIAGNOSIS — M810 Age-related osteoporosis without current pathological fracture: Secondary | ICD-10-CM

## 2022-12-03 ENCOUNTER — Ambulatory Visit
Admission: RE | Admit: 2022-12-03 | Discharge: 2022-12-03 | Disposition: A | Discharge status: Home / Self Care | Payer: Medicare HMO | Source: Ambulatory Visit | Attending: Family Medicine | Admitting: Family Medicine

## 2022-12-03 DIAGNOSIS — Z1231 Encounter for screening mammogram for malignant neoplasm of breast: Secondary | ICD-10-CM

## 2022-12-10 ENCOUNTER — Ambulatory Visit
Admission: RE | Admit: 2022-12-10 | Discharge: 2022-12-10 | Disposition: A | Discharge status: Home / Self Care | Payer: Medicare HMO | Source: Ambulatory Visit | Attending: Neurosurgery | Admitting: Neurosurgery

## 2022-12-10 DIAGNOSIS — R519 Headache, unspecified: Secondary | ICD-10-CM

## 2022-12-17 ENCOUNTER — Other Ambulatory Visit: Payer: Self-pay | Admitting: Neurosurgery

## 2022-12-17 DIAGNOSIS — M4312 Spondylolisthesis, cervical region: Secondary | ICD-10-CM

## 2023-01-03 ENCOUNTER — Ambulatory Visit
Admission: RE | Admit: 2023-01-03 | Discharge: 2023-01-03 | Disposition: A | Discharge status: Home / Self Care | Payer: Medicare HMO | Source: Ambulatory Visit | Attending: Neurosurgery | Admitting: Neurosurgery

## 2023-01-03 DIAGNOSIS — M4312 Spondylolisthesis, cervical region: Secondary | ICD-10-CM

## 2023-02-25 ENCOUNTER — Other Ambulatory Visit: Payer: Self-pay | Admitting: *Deleted

## 2023-02-25 ENCOUNTER — Ambulatory Visit: Payer: Medicare HMO | Attending: Cardiovascular Disease | Admitting: Cardiovascular Disease

## 2023-02-25 ENCOUNTER — Encounter: Payer: Self-pay | Admitting: Cardiovascular Disease

## 2023-02-25 DIAGNOSIS — M329 Systemic lupus erythematosus, unspecified: Secondary | ICD-10-CM | POA: Diagnosis not present

## 2023-02-25 DIAGNOSIS — Z8249 Family history of ischemic heart disease and other diseases of the circulatory system: Secondary | ICD-10-CM

## 2023-02-25 DIAGNOSIS — E785 Hyperlipidemia, unspecified: Secondary | ICD-10-CM | POA: Diagnosis not present

## 2023-02-25 DIAGNOSIS — E039 Hypothyroidism, unspecified: Secondary | ICD-10-CM

## 2023-02-25 MED ORDER — EZETIMIBE 10 MG PO TABS: 10.0000 mg | ORAL_TABLET | Freq: Every day | ORAL | 3 refills | Status: AC

## 2023-02-25 NOTE — Progress Notes (Signed)
Patient ID: Brooke Williams, female   DOB: February 10, 1946, 77 y.o.   MRN: [id number]      HPI: Brooke Williams is a 77 y.o. female who presents for a 12 month follow-up cardiology evaluation.   [name] has a history of lupus erythematosus, hyperlipidemia, and strong family history for premature coronary disease. 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 In the past I tried initiating therapy with Simcor 500/20 due to her lipid parameters but she was unable to tolerate this. Subsequently, we tried low-dose Crestor but again she stopped it secondary to development of vague myalgias.  She was diagnosed with endometrial cancer and underwent complete hysterectomy in January 2014.  Initially she did not require chemotherapy or radiation.  However, since I last saw her , she develop recurrence in 2015  and required surgery in February 2015.  She received paclitaxel and carboplatin chemotherapy.  She also has had several procedures for removal of lipoma from her lower back.  She is involved in clinical research study at [facility] evaluating the use of metformin for recurrent cancer.  When I saw her in December 2016 her sister had recently undergone placement of 2 coronary stents and her mother's twin sister had just undergone CABG surgery. She had undergone previous left knee replacement several years ago.  She continues to be followed closely at [facility] and she brought with her recent results of her CT scans where she was found to have a stable right lower lobe 1.1 cm nodule.  There was no evidence for recurrent or metastatic disease in the abdomen or pelvis  I saw her in May 2019 for preoperative clearance prior to undergoing right knee replacement with Dr. Frederik Pear. A  preoperative clearance  ECG showed small Q waves in 3 and nondiagnostic diminutive Q wave in aVF.  Because of her ECG she was referred to me today prior to undergoing surgery the following day.  I felt she was stable from a cardiac standpoint.  She underwent surgery successfully without cardiovascular compromise.  During that evaluation I noticed that she had had laboratory from her primary physician in November 2018 and her cholesterol was elevated at 226 with an LDL of 148.  She apparently was given a prescription for Crestor 5 mg but she never started this.  She developed the flu in January and then required surgery in February for rectal prolapse.    When I saw her in May 2019 I recommended initiation of Crestor. She started at 5 mg with plans to titrate to 10 as tolerated.  If she was unable to tolerate Crestor then Zetia was to be added and potentially she could be a candidate for PCSK9 inhibition.  She has tolerated Crestor 5 mg but never titrated this to 10 mg.  Most recent lipid studies this past week has shown marked improvement in LDL to 89.  However, she states that she stopped taking Crestor around the time of her surgery but several weeks ago reinstituted this and as result she believes her levels may actually be further improved.  She was recently seen by her primary physician and she is to start Fosamax.  She denies chest pain.  She denies palpitations.   I saw her in September 2019.  Apparently she  had not started the Crestor initially and waited until completed healing from knee surgery prior to instituting therapy.  I recommended increased activity.  She had repeat laboratory on January 31, 2019.  Total cholesterol was 166 triglycerides had improved from 150 down to 72 HDL was 57 and LDL 95.  She was tolerant she is tolerating Crestor 5 mg but never titrated up to 10 mg.  She denies any palpitations.  She has been on levothyroxine 75 mcg for hypothyroidism.   I saw her in March 2020 and last  evaluated her in October 2020 at which time she continued to do well and denied any chest pain or shortness of breath.  She did not tolerate trial of increasing Crestor to 10 mg, and therefore reduced her dose back to 5 mg.  She was still noticing some joint discomfort and as result in early August completely discontinued statin therapy.  She has had issues with headaches.  In the past she has seen Dr. Lajean Manes and is felt to have longstanding migraine headaches.  She sees Dr. Leslie Dales for her systemic lupus.  She underwent repeat laboratory on August 28, 2019.  Renal function was stable.  TSH was normal at 246.  Total cholesterol was 181, triglycerides 127, HDL 55, LDL 104.    I evaluated her in a telemedicine visit in April 2021.  She had developed inner eye inflammation resulting in transient diplopia which had resolved.  She was unaware of any palpitations or chest pain.  With her history of statin induced myalgias she was only on Zetia and was no longer taking rosuvastatin.  LDL had improved to 83 from 114.  She continues to be on levothyroxine for hypothyroidism.  Her lupus was controlled.  I discussed resumption of exercise.  I saw her on January 06, 2021 and since her prior evaluation she had stopped taking Zetia because she felt it made her tired.  For the last 2 months she resumed taking low-dose rosuvastatin at 5 mg daily and is seem to tolerate this.  She is no longer on Plaquenil for her lupus which has been in remission.  She has been eating gluten-free.  She is not had recent laboratory.   I saw her on February 12, 2022. She continued to be followed by Dr. Amil Amen for her lupus.  Laboratory in March 2022 showed total cholesterol 175 HDL 59 LDL 98 and triglycerides 100.  Serum creatinine in September 2022 was 0.74.  She is now back on Plaquenil 100 mg twice a day for her lupus.  She continues to be on rosuvastatin 5 mg for hyperlipidemia and Synthroid 75 mcg for hypothyroidism.  She takes  famotidine as needed for dyspepsia.  During that evaluation, her blood pressure was stable and on repeat by me was 114/72.  I last saw her, over the past year she has felt well.  She has been off statin therapy for approximately 6 months.  In November 2023 total cholesterol was 227 triglycerides 116 HDL 62 and her LDL had increased to 134.  She tried to retake Crestor 5 mg in January and February but due to recurrent symptoms, she has not taken any low-dose rosuvastatin during the month of March.  She denies any chest tightness or pressure.  She is on levothyroxine 75 mcg for hypothyroidism.  She presents for evaluation.  Past Medical History:  Diagnosis Date   Arthritis    arthritis-hands. cervical disc degeneration   Back pain    6th vertebrae has  collapse on 7th,pinched nerve feeling occ   Dizziness    rarely,takes Meclizine if needed   Endometrial cancer ([place]) 2014   GERD (gastroesophageal reflux disease)    controls with Zantac   Headache(784.0)    headaches frequently but states since placed on 2L/m via N/C headaches have improved   History of colon polyps    benign   History of staph infection    Hyperlipidemia    takes krill oil daily   Hypothyroidism    takes Synthroid daily   Ileus ([place]) 10/2015   was treated   Joint pain    Joint swelling    Lupus ([place])    remission since 2008   Nocturia    OSA (obstructive sleep apnea)    sleep study in epic from 11-01-15.Uses Oxygen   Pneumonia    hx of-within the last 15 yrs   PONV (postoperative nausea and vomiting)    Restless leg syndrome    takes Mirapex nightly   Seasonal allergies    takes Allegra daily and uses Astelin Nasal Spray as needed    Past Surgical History:  Procedure Laterality Date   ABDOMINAL HYSTERECTOMY     BRAIN SURGERY     left cataract   COLON SURGERY     12/2017   COLONOSCOPY     DILATION AND CURETTAGE OF UTERUS     2008-polyp removed   HAND SURGERY  2006   left thumb surgery with pin   KNEE  ARTHROPLASTY     KNEE ARTHROSCOPY Left 04/08/2018   Procedure: ARTHROSCOPY LEFT KNEE;  Surgeon: Frederik Pear, MD;  Location: [facility];  Service: Orthopedics;  Laterality: Left;   lipomas removed from back   40 yrs ago   MASS EXCISION N/A 10/05/2014   Procedure: EXCISION OF BILATERAL LOWER BACK MASSES;  Surgeon: Coralie Keens, MD;  Location: [place];  Service: General;  Laterality: N/A;   MEDIAL PARTIAL KNEE REPLACEMENT  2005   LT knee   OTHER SURGICAL HISTORY     metal pin/screw to repair arthritis in LT thumb joint   ROBOTIC ASSISTED TOTAL HYSTERECTOMY WITH BILATERAL SALPINGO OOPHERECTOMY  12/20/2012   Procedure: ROBOTIC ASSISTED TOTAL HYSTERECTOMY WITH BILATERAL SALPINGO OOPHORECTOMY;  Surgeon: Imagene Gurney A. Alycia Rossetti, MD;  Location: [facility];  Service: Gynecology;  Laterality: N/A;  WITH PELVIC PARAAORTIC    ROBOTIC PELVIC AND PARA-AORTIC LYMPH NODE DISSECTION  12/20/2012   Procedure: ROBOTIC PELVIC AND PARA-AORTIC LYMPH NODE DISSECTION;  Surgeon: Imagene Gurney A. Alycia Rossetti, MD;  Location: [facility];  Service: Gynecology;  Laterality: Bilateral;   TOTAL KNEE ARTHROPLASTY Right 04/08/2018   Procedure: RIGHT TOTAL KNEE ARTHROPLASTY;  Surgeon: Frederik Pear, MD;  Location: [facility];  Service: Orthopedics;  Laterality: Right;  Right TKA and Left Knee Arthroscopy   TOTAL KNEE REVISION Left 02/03/2016   Procedure: TOTAL KNEE REVISION;  Surgeon: Frederik Pear, MD;  Location: [place] [facility];  Service: Orthopedics;  Laterality: Left;   WRIST SURGERY Left 06/07/2019   Dr. Grandville Silos     Allergies  Allergen Reactions   Cefdinir Hives, Itching and Rash   Penicillins Hives    CHILDHOOD REACTION Has patient had a PCN reaction causing immediate rash, facial/tongue/throat swelling, SOB or lightheadedness with hypotension: #  #  NO  #  #  Has patient had a PCN reaction occurring within the last 10 years:#  #  #  YES  #  #  #   If all of the above answers are "NO", then may  proceed with Cephalosporin use.   Tape Itching and Rash     Skin turns red Bandaid   Penicillamine Rash    Current Outpatient Medications  Medication Sig Dispense Refill   Alpha-Lipoic Acid 600 MG CAPS Take by mouth daily.     CALCIUM PO Take 743 mg by mouth daily.     diclofenac Sodium (VOLTAREN) 1 % GEL Apply topically.     doxycycline (VIBRA-TABS) 100 MG tablet Take 1 tablet by mouth daily.     ezetimibe (ZETIA) 10 MG tablet Take 1 tablet (10 mg total) by mouth daily. 90 tablet 3   famotidine (PEPCID) 20 MG tablet Take 20 mg by mouth as needed for heartburn or indigestion.     fluocinonide gel (LIDEX) 0.05 % APPLY TO AFFECTED AREA twice a day as needed for canker sores.  1   fluticasone (FLONASE) 50 MCG/ACT nasal spray Place into both nostrils 2 (two) times daily.     loratadine-pseudoephedrine (CLARITIN-D 12-HOUR) 5-120 MG tablet Take 1 tablet by mouth daily as needed for allergies.     Multiple Vitamin (MULTIVITAMIN WITH MINERALS) TABS Take 1 tablet by mouth daily.     Psyllium (METAMUCIL FIBER PO) Take 5 capsules by mouth daily.      rosuvastatin (CRESTOR) 5 MG tablet Take 1 tablet (5 mg total) by mouth daily. 90 tablet 3   SYNTHROID 75 MCG tablet Take 75 mcg by mouth daily before breakfast.      triamcinolone (KENALOG) 0.1 % Apply topically.     dicyclomine (BENTYL) 20 MG tablet Take by mouth.     No current facility-administered medications for this visit.    Social History   Socioeconomic History   Marital status: Married    Spouse name: Josph Macho   Number of children: 0   Years of education: 12   Highest education level: High school graduate  Occupational History   Occupation: retired  Tobacco Use   Smoking status: Never   Smokeless tobacco: Never  Scientific laboratory technician Use: Never used  Substance and Sexual Activity   Alcohol use: Not Currently    Comment: rarely   Drug use: No   Sexual activity: Not Currently    Birth control/protection: Post-menopausal  Other Topics Concern   Not on file  Social History Narrative    Patient does not have any biological children but has step children      Right handed   Two story    Lives with spouse   Social Determinants of Health   Financial Resource Strain: Not on file  Food Insecurity: Not on file  Transportation Needs: Not on file  Physical Activity: Not on file  Stress: Not on file  Social Connections: Not on file  Intimate Partner Violence: Not on file    Family History  Problem Relation Age of Onset   Stroke Mother 37       arterial wall of artery in eye    Endometrial cancer Mother 40   Dementia Father    Stroke Father        "lots of many strokes"   Aneurysm Father 27       AAA   Multiple births Maternal Grandmother        died 5 days after delivery of unknown causes   Colon cancer Maternal Aunt        late 5s   Cancer Cousin        maternal cousin with an unknown form of cancer  Breast cancer Neg Hx    Additional social history is that I take care of her husband Mr. Naela Richman. There is no history of ever smoking tobacco. Occasional alcohol use.   ROS General: Negative; No fevers, chills, or night sweats;  HEENT: Negative; No changes in vision or hearing, sinus congestion, difficulty swallowing Pulmonary: Negative; No cough, wheezing, shortness of breath, hemoptysis Cardiovascular: Negative; No chest pain, presyncope, syncope, palpitations GI: Negative; No nausea, vomiting, diarrhea, or abdominal pain GU: Negative; No dysuria, hematuria, or difficulty voiding Musculoskeletal: status post successful knee surgery Rheumatology: Lupus Hematologic/Oncology: Positive for endometrial cancer Endocrine: Negative; no heat/cold intolerance; no diabetes Neuro: Positive for history of migraine headaches Skin: Negative; No rashes or skin lesions Psychiatric: Negative; No behavioral problems, depression Sleep: Negative; No snoring, daytime sleepiness, hypersomnolence, bruxism, restless legs, hypnogognic hallucinations, no cataplexy Other  comprehensive 14 point system review is negative.  PE BP 116/70 (BP Location: Left Arm, Patient Position: Sitting, Cuff Size: Normal)   Pulse 81   Ht 5\' 7"  (1.702 m)   Wt 153 lb (69.4 kg)   BMI 23.96 kg/m    Repeat blood pressure by me 116/70  Wt Readings from Last 3 Encounters:  02/25/23 153 lb (69.4 kg)  09/15/22 150 lb (68 kg)  02/12/22 157 lb (71.2 kg)   General: Alert, oriented, no distress.  Skin: normal turgor, no rashes, warm and dry HEENT: Normocephalic, atraumatic. Pupils equal round and reactive to light; sclera anicteric; extraocular muscles intact;  Nose without nasal septal hypertrophy Mouth/Parynx benign; Mallinpatti scale 2 Neck: No JVD, no carotid bruits; normal carotid upstroke Lungs: clear to ausculatation and percussion; no wheezing or rales Chest wall: without tenderness to palpitation Heart: PMI not displaced, RRR, s1 s2 normal, 1/6 systolic murmur, no diastolic murmur, no rubs, gallops, thrills, or heaves Abdomen: soft, nontender; no hepatosplenomehaly, BS+; abdominal aorta nontender and not dilated by palpation. Back: no CVA tenderness Pulses 2+ Musculoskeletal: full range of motion, normal strength, no joint deformities Extremities: no clubbing cyanosis or edema, Homan's sign negative  Neurologic: grossly nonfocal; Cranial nerves grossly wnl Psychologic: Normal mood and affect    February 25, 2023 ECG (independently read by me): NSR at 81, QS V1-3  February 12, 2022 ECG (independently read by me):  NSR at 74; PRWP V1-3  January 06, 2021 ECG (independently read by me): NSR at 70; Q in III; no ectopy  October 2020  ECG (independently read by me): Normal sinus rhythm at 96 bpm.  Nonspecific ST changes.  Normal intervals.  No ectopy.  March 2020 ECG (independently read by me): Sinus rhythm at 85 bpm.  No ectopy.  Normal intervals.  September 2019 ECG (independently read by me): Normal sinus rhythm at 91 bpm.  Normal intervals.  No ectopy.  May 2019 ECG  (independently read by me): Normal sinus rhythm at 84 bpm.  Possible borderline left atrial enlargement.  No ectopy. Normal intervals.  No evidence for prior MI.  Q waves in lead III.  Normal R waves in lead II and aVF  September 2016 ECG (independently read by me): Normal sinus rhythm at 74 bpm.  No ectopy.  Normal intervals.  ECG: Sinus rhythm at 69 beats per minute. No ectopy. Normal intervals.  LABS:    Latest Ref Rng & Units 02/12/2022    9:24 AM 02/10/2021    8:57 AM 03/14/2020   10:21 AM  BMP  Glucose 70 - 99 mg/dL 86  90  88   BUN 8 -  27 mg/dL 14  15  15    Creatinine 0.57 - 1.00 mg/dL 0.76  0.72  0.74   BUN/Creat Ratio 12 - 28 18  21  20    Sodium 134 - 144 mmol/L 144  141  143   Potassium 3.5 - 5.2 mmol/L 4.5  4.9  4.3   Chloride 96 - 106 mmol/L 106  105  106   CO2 20 - 29 mmol/L 27  22  21    Calcium 8.7 - 10.3 mg/dL 9.5  9.2  9.3       Latest Ref Rng & Units 02/12/2022    9:24 AM 02/10/2021    8:57 AM 03/14/2020   10:21 AM  Hepatic Function  Total Protein 6.0 - 8.5 g/dL 6.3  6.1  6.4   Albumin 3.7 - 4.7 g/dL 4.4  4.3  4.2   AST 0 - 40 IU/L 23  26  28    ALT 0 - 32 IU/L 29  26  31    Alk Phosphatase 44 - 121 IU/L 87  105  113   Total Bilirubin 0.0 - 1.2 mg/dL 0.3  0.2  0.2       Latest Ref Rng & Units 02/12/2022    9:24 AM 02/10/2021    8:57 AM 02/15/2020    4:03 PM  CBC  WBC 3.4 - 10.8 x10E3/uL 6.9  6.6  6.7   Hemoglobin 11.1 - 15.9 g/dL 13.7  14.1  14.1   Hematocrit 34.0 - 46.6 % 41.6  41.9  41.9   Platelets 150 - 450 x10E3/uL 226  211  258.0    Lab Results  Component Value Date   MCV 94 02/12/2022   MCV 92 02/10/2021   MCV 91.3 02/15/2020   Lab Results  Component Value Date   TSH 1.46 02/15/2020    No results found for: "HGBA1C"   Lipid Panel     Component Value Date/Time   CHOL 149 02/12/2022 0924   TRIG 108 02/12/2022 0924   HDL 66 02/12/2022 0924   CHOLHDL 2.3 02/12/2022 0924   CHOLHDL 3.7 09/06/2015 0914   VLDL 32 (H) 09/06/2015 0914   LDLCALC  64 02/12/2022 0924     RADIOLOGY: No results found.  Prior  2016 NMR lipoprotein: significant increase LDL particle #2251, LDL calculated 152, HDL 54, triglycerides 180, total cholesterol 242, small LDL particle #1427, and insulin resistance coronary increased at 49.   IMPRESSION:  1. Hyperlipidemia LDL goal <70   2. Family history of premature CAD   3. Lupus ([place])   4. Hypothyroidism, unspecified type    ASSESSMENT AND PLAN: Mrs. Sneha Hashimoto is a 77 year-old female who has a strong family history for premature coronary artery disease with several family members having undergone cardiovascular interventions.  She has a history of hyperlipidemia and has had issues of statin induced myalgia.  She was able to be reinstituted and tolerated Crestor 5 mg but did not seem to tolerate the increased regimen to 10 mg.  As result subsequently she had reduced her dose back to 5 mg and since August 2020 discontinue therapy altogether.  On subsequent evaluation she had been started on Zetia which she initially tolerated but subsequently stopped this due to complaints that it made her tired.    LDL cholesterol in March 2022 was 98.  She previously had been taking off Plaquenil when her lupus was in remission but subsequently was resumed at 100 mg twice a day by Dr. Amil Amen.  On low-dose  rosuvastatin, LDL cholesterol had reduced to 83.  Apparently, she stopped taking rosuvastatin for 6 months and LDL cholesterol in November had increased back to 134 with total cholesterol 227.  She retried starting 5 mg of rosuvastatin for January and February but due to similar complaints has been off therapy for the past month.  Presently, I have recommended a retrial of Zetia 10 mg.  I will recheck a comprehensive metabolic panel, lipid panel and LP(a) in several months.  If LDL is still increased she will then be started on combination bempedoic acid with Zetia.  She has been under increased stress at home with her  husband's progressive dementia.  She continues to be on levothyroxine 75 mcg for hypothyroidism.  Her blood pressure today is stable.  I will see her in 6 months for reevaluation or sooner as needed.    Troy Sine, MD, [facility]  03/03/2023 10:21 AM

## 2023-02-25 NOTE — Patient Instructions (Addendum)
Medication Instructions:  Start taking Zetia 10 mg daily   *If you need a refill on your cardiac medications before your next appointment, please call your pharmacy*   Lab Work: fasting 2 to 3 months  LIPId  CMP If you have labs (blood work) drawn today and your tests are completely normal, you will receive your results only by: [facility] (if you have MyChart) OR A paper copy in the mail If you have any lab test that is abnormal or we need to change your treatment, we will call you to review the results.   Testing/Procedures:  Not needed   Follow-Up: At [facility], you and your health needs are our priority.  As part of our continuing mission to provide you with exceptional heart care, we have created designated Provider Care Teams.  These Care Teams include your primary Cardiologist (physician) and Advanced Practice Providers (APPs -  Physician Assistants and Nurse Practitioners) who all work together to provide you with the care you need, when you need it.     Your next appointment:    7 to 8 month(s)  call in Oct or Nov 2024  The format for your next appointment:   In Person  Provider:   Shelva Majestic, MD

## 2023-03-03 ENCOUNTER — Encounter: Payer: Self-pay | Admitting: Cardiovascular Disease

## 2023-05-10 ENCOUNTER — Ambulatory Visit
Admission: RE | Admit: 2023-05-10 | Discharge: 2023-05-10 | Disposition: A | Discharge status: Home / Self Care | Payer: Medicare HMO | Source: Ambulatory Visit | Attending: Internal Medicine | Admitting: Internal Medicine

## 2023-05-10 DIAGNOSIS — M810 Age-related osteoporosis without current pathological fracture: Secondary | ICD-10-CM

## 2023-11-04 ENCOUNTER — Encounter: Payer: Self-pay | Admitting: Cardiovascular Disease

## 2023-11-04 ENCOUNTER — Ambulatory Visit: Payer: Medicare HMO | Attending: Cardiovascular Disease | Admitting: Cardiovascular Disease

## 2023-11-04 VITALS — BP 104/68 | HR 95 | Ht 66.5 in | Wt 158.2 lb

## 2023-11-04 DIAGNOSIS — E785 Hyperlipidemia, unspecified: Secondary | ICD-10-CM | POA: Diagnosis not present

## 2023-11-04 DIAGNOSIS — E039 Hypothyroidism, unspecified: Secondary | ICD-10-CM

## 2023-11-04 DIAGNOSIS — Z8249 Family history of ischemic heart disease and other diseases of the circulatory system: Secondary | ICD-10-CM | POA: Diagnosis not present

## 2023-11-04 DIAGNOSIS — M329 Systemic lupus erythematosus, unspecified: Secondary | ICD-10-CM

## 2023-11-04 DIAGNOSIS — R011 Cardiac murmur, unspecified: Secondary | ICD-10-CM | POA: Diagnosis not present

## 2023-11-04 MED ORDER — OMEGA-3-ACID ETHYL ESTERS 1 G PO CAPS: 1.0000 g | ORAL_CAPSULE | Freq: Every day | ORAL | 3 refills | Status: DC

## 2023-11-04 MED ORDER — EZETIMIBE 10 MG PO TABS: 10.0000 mg | ORAL_TABLET | Freq: Every day | ORAL | 3 refills | Status: DC

## 2023-11-04 NOTE — Progress Notes (Signed)
Patient ID: Brooke Williams, female   DOB: [birth year], 77 y.o.   MRN: [id number]      HPI: Brooke Williams is a 77 y.o. female who presents for a 9 month follow-up cardiology evaluation.   Brooke Williams has a history of lupus erythematosus, hyperlipidemia, and strong family history for premature coronary disease. 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 In the past I tried initiating therapy with Simcor 500/20 due to her lipid parameters but she was unable to tolerate this. Subsequently, we tried low-dose Crestor but again she stopped it secondary to development of vague myalgias.  She was diagnosed with endometrial cancer and underwent complete hysterectomy in January 2014.  Initially she did not require chemotherapy or radiation.  However, since I last saw her , she develop recurrence in 2015  and required surgery in February 2015.  She received paclitaxel and carboplatin chemotherapy.  She also has had several procedures for removal of lipoma from her lower back.  She is involved in clinical research study at [facility] evaluating the use of metformin for recurrent cancer.  When I saw her in December 2016 her sister had recently undergone placement of 2 coronary stents and her mother's twin sister had just undergone CABG surgery. She had undergone previous left knee replacement several years ago.  She continues to be followed closely at [facility] and she brought with her recent results of her CT scans where she was found to have a stable right lower lobe 1.1 cm nodule.  There was no evidence for recurrent or metastatic disease in the abdomen or pelvis  I saw her in May 2019 for preoperative clearance prior to undergoing right knee replacement with Dr. Gean Birchwood. A  preoperative clearance  ECG showed small Q waves in 3 and nondiagnostic diminutive Q wave in aVF.  Because of her ECG she was referred to me today prior to undergoing surgery the following day.  I felt she was stable from a cardiac standpoint.  She underwent surgery successfully without cardiovascular compromise.  During that evaluation I noticed that she had had laboratory from her primary physician in November 2018 and her cholesterol was elevated at 226 with an LDL of 148.  She apparently was given a prescription for Crestor 5 mg but she never started this.  She developed the flu in January and then required surgery in February for rectal prolapse.    When I saw her in May 2019 I recommended initiation of Crestor. She started at 5 mg with plans to titrate to 10 as tolerated.  If she was unable to tolerate Crestor then Zetia was to be added and potentially she could be a candidate for PCSK9 inhibition.  She has tolerated Crestor 5 mg but never titrated this to 10 mg.  Most recent lipid studies this past week has shown marked improvement in LDL to 89.  However, she states that she stopped taking Crestor around the time of her surgery but several weeks ago reinstituted this and as result she believes her levels may actually be further improved.  She was recently seen by her primary physician and she is to start Fosamax.  She denies chest pain.  She denies palpitations.   I saw her in September 2019.  Apparently she  had not started the Crestor initially and waited until completed healing from knee surgery prior to instituting therapy.  I recommended increased activity.  She had repeat laboratory on January 31, 2019.  Total cholesterol was 166 triglycerides had improved from 150 down to 72 HDL was 57 and LDL 95.  She was tolerant she is tolerating Crestor 5 mg but never titrated up to 10 mg.  She denies any palpitations.  She has been on levothyroxine 75 mcg for hypothyroidism.   I saw her in March 2020 and last  evaluated her in October 2020 at which time she continued to do well and denied any chest pain or shortness of breath.  She did not tolerate trial of increasing Crestor to 10 mg, and therefore reduced her dose back to 5 mg.  She was still noticing some joint discomfort and as result in early August completely discontinued statin therapy.  She has had issues with headaches.  In the past she has seen Dr. Meredith Pel and is felt to have longstanding migraine headaches.  She sees Dr. Kingsley Spittle for her systemic lupus.  She underwent repeat laboratory on August 28, 2019.  Renal function was stable.  TSH was normal at 246.  Total cholesterol was 181, triglycerides 127, HDL 55, LDL 104.    I evaluated her in a telemedicine visit in April 2021.  She had developed inner eye inflammation resulting in transient diplopia which had resolved.  She was unaware of any palpitations or chest pain.  With her history of statin induced myalgias she was only on Zetia and was no longer taking rosuvastatin.  LDL had improved to 83 from 114.  She continues to be on levothyroxine for hypothyroidism.  Her lupus was controlled.  I discussed resumption of exercise.  I saw her on January 06, 2021 and since her prior evaluation she had stopped taking Zetia because she felt it made her tired.  For the last 2 months she resumed taking low-dose rosuvastatin at 5 mg daily and is seem to tolerate this.  She is no longer on Plaquenil for her lupus which has been in remission.  She has been eating gluten-free.  She is not had recent laboratory.   I saw her on February 12, 2022. She continued to be followed by Dr. Dierdre Forth for her lupus.  Laboratory in March 2022 showed total cholesterol 175 HDL 59 LDL 98 and triglycerides 100.  Serum creatinine in September 2022 was 0.74.  She is now back on Plaquenil 100 mg twice a day for her lupus.  She continues to be on rosuvastatin 5 mg for hyperlipidemia and Synthroid 75 mcg for hypothyroidism.  She takes  famotidine as needed for dyspepsia.  During that evaluation, her blood pressure was stable and on repeat by me was 114/72.  I last saw her on February 25, 2023.  Over the past year she has felt well.  She has been off statin therapy for approximately 6 months.  In November 2023 total cholesterol was 227 triglycerides 116 HDL 62 and her LDL had increased to 134.  She tried to retake Crestor 5 mg in January and February but due to recurrent symptoms, she has not taken any low-dose rosuvastatin during the month of March.  She denies any chest tightness or pressure.  She is on levothyroxine 75 mcg for hypothyroidism.  During that evaluation, I recommended a retrial of Zetia 10 mg.  I recommended subsequent laboratory including LP(a) and if LDL was still increased discussed combination  bempedoic acid with Zetia.  She was under progressively increasing stress secondary to her husband's significantly progressive dementia.  Since I last saw her, her husband has now been at [facility] memory center for significantly progressive dementia since June..  She tells me they are not expecting him to live longer than 6 months.  She had undergone recent laboratory on October 13, 2023 by Dr. Brett Fairy her new primary physician at [facility].  Total cholesterol was 205.  Triglycerides 119, HDL 78, and LDL cholesterol was 105.  She has noticed recent episodes of decongestants and has been taking Sudafed which has resulted in some pressure headaches.  Her last echo Doppler study was in September 2012 done at the [facility] which showed normal EF greater than 55%, and trace MR and TR.  She presents for evaluation.  Past Medical History:  Diagnosis Date   Arthritis    arthritis-hands. cervical disc degeneration   Back pain    6th vertebrae has collapse on 7th,pinched nerve feeling occ   Dizziness    rarely,takes Meclizine if needed   Endometrial cancer (HCC)  2014   GERD (gastroesophageal reflux disease)    controls with Zantac   Headache(784.0)    headaches frequently but states since placed on 2L/m via N/C headaches have improved   History of colon polyps    benign   History of staph infection    Hyperlipidemia    takes krill oil daily   Hypothyroidism    takes Synthroid daily   Ileus (HCC) 10/2015   was treated   Joint pain    Joint swelling    Lupus    remission since 2008   Nocturia    OSA (obstructive sleep apnea)    sleep study in epic from 11-01-15.Uses Oxygen   Pneumonia    hx of-within the last 15 yrs   PONV (postoperative nausea and vomiting)    Restless leg syndrome    takes Mirapex nightly   Seasonal allergies    takes Allegra daily and uses Astelin Nasal Spray as needed    Past Surgical History:  Procedure Laterality Date   ABDOMINAL HYSTERECTOMY     BRAIN SURGERY     left cataract   COLON SURGERY     12/2017   COLONOSCOPY     DILATION AND CURETTAGE OF UTERUS     2008-polyp removed   HAND SURGERY  2006   left thumb surgery with pin   KNEE ARTHROPLASTY     KNEE ARTHROSCOPY Left 04/08/2018   Procedure: ARTHROSCOPY LEFT KNEE;  Surgeon: Gean Birchwood, MD;  Location: [facility];  Service: Orthopedics;  Laterality: Left;   lipomas removed from back   40 yrs ago   MASS EXCISION N/A 10/05/2014   Procedure: EXCISION OF BILATERAL LOWER BACK MASSES;  Surgeon: Abigail Miyamoto, MD;  Location: [facility];  Service: General;  Laterality: N/A;   MEDIAL PARTIAL KNEE REPLACEMENT  2005   LT knee   OTHER SURGICAL HISTORY     metal pin/screw to repair arthritis in LT thumb joint   ROBOTIC ASSISTED TOTAL HYSTERECTOMY WITH BILATERAL SALPINGO OOPHERECTOMY  12/20/2012   Procedure: ROBOTIC ASSISTED TOTAL HYSTERECTOMY WITH BILATERAL SALPINGO OOPHORECTOMY;  Surgeon: Rejeana Brock A. Duard Brady, MD;  Location: [facility];  Service: Gynecology;  Laterality: N/A;  WITH PELVIC PARAAORTIC    ROBOTIC PELVIC AND PARA-AORTIC LYMPH NODE DISSECTION   12/20/2012   Procedure: ROBOTIC PELVIC AND PARA-AORTIC LYMPH NODE DISSECTION;  Surgeon: Bernita Buffy. Duard Brady, MD;  Location: [facility];  Service: Gynecology;  Laterality: Bilateral;   TOTAL KNEE ARTHROPLASTY Right 04/08/2018   Procedure: RIGHT TOTAL KNEE ARTHROPLASTY;  Surgeon: Gean Birchwood, MD;  Location: [facility];  Service: Orthopedics;  Laterality: Right;  Right TKA and Left Knee Arthroscopy   TOTAL KNEE REVISION Left 02/03/2016   Procedure: TOTAL KNEE REVISION;  Surgeon: Gean Birchwood, MD;  Location: [facility];  Service: Orthopedics;  Laterality: Left;   WRIST SURGERY Left 06/07/2019   Dr. Janee Morn     Allergies  Allergen Reactions   Cefdinir Hives, Itching and Rash   Penicillins Hives    CHILDHOOD REACTION Has patient had a PCN reaction causing immediate rash, facial/tongue/throat swelling, SOB or lightheadedness with hypotension: #  #  NO  #  #  Has patient had a PCN reaction occurring within the last 10 years:#  #  #  YES  #  #  #   If all of the above answers are "NO", then may proceed with Cephalosporin use.   Tape Itching and Rash    Skin turns red Bandaid   Penicillamine Rash    Current Outpatient Medications  Medication Sig Dispense Refill   Alpha-Lipoic Acid 600 MG CAPS Take by mouth daily.     CALCIUM PO Take 743 mg by mouth daily.     doxycycline (VIBRA-TABS) 100 MG tablet Take 1 tablet by mouth daily.     ezetimibe (ZETIA) 10 MG tablet Take 1 tablet (10 mg total) by mouth daily. 90 tablet 3   ezetimibe (ZETIA) 10 MG tablet Take 1 tablet (10 mg total) by mouth daily. 90 tablet 3   famotidine (PEPCID) 20 MG tablet Take 20 mg by mouth as needed for heartburn or indigestion.     fluocinonide gel (LIDEX) 0.05 % APPLY TO AFFECTED AREA twice a day as needed for canker sores.  1   fluticasone (FLONASE) 50 MCG/ACT nasal spray Place into both nostrils 2 (two) times daily.     loratadine-pseudoephedrine (CLARITIN-D 12-HOUR) 5-120 MG tablet Take 1 tablet by mouth daily as needed for allergies.      Multiple Vitamin (MULTIVITAMIN WITH MINERALS) TABS Take 1 tablet by mouth daily.     omega-3 acid ethyl esters (LOVAZA) 1 g capsule Take 1 capsule (1 g total) by mouth daily. 90 capsule 3   predniSONE (DELTASONE) 5 MG tablet Take 5 mg by mouth daily as needed.     Psyllium (METAMUCIL FIBER PO) Take 5 capsules by mouth daily.      rosuvastatin (CRESTOR) 5 MG tablet Take 1 tablet (5 mg total) by mouth daily. 90 tablet 3   SYNTHROID 75 MCG tablet Take 75 mcg by mouth daily before breakfast.      triamcinolone (KENALOG) 0.1 % Apply topically.     UNABLE TO FIND Med Name: [place] Tail Mushroom Capsule     diclofenac Sodium (VOLTAREN) 1 % GEL Apply topically. (Patient not taking: Reported on 11/04/2023)     dicyclomine (BENTYL) 20 MG tablet Take by mouth.     No current facility-administered medications for this visit.    Social History   Socioeconomic History   Marital status: Married    Spouse name: [name]   Number of children: 0   Years of education: 12   Highest education level: High school graduate  Occupational History   Occupation: retired  Tobacco Use   Smoking status: Never   Smokeless tobacco: Never  Vaping Use   Vaping status:  Never Used  Substance and Sexual Activity   Alcohol use: Not Currently    Comment: rarely   Drug use: No   Sexual activity: Not Currently    Birth control/protection: Post-menopausal  Other Topics Concern   Not on file  Social History Narrative   Patient does not have any biological children but has step children      Right handed   Two story    Lives with spouse   Social Determinants of Health   Financial Resource Strain: Low Risk  (10/01/2021)   [facility] prior to 01/30/2023., [facility] visits prior to 01/30/2023.   Overall Financial Resource Strain (CARDIA)    Difficulty of Paying Living Expenses: Not hard at all  Food Insecurity: Low Risk  (10/12/2023)   [facility]   Hunger Vital Sign    Worried About Running Out of Food in the Last Year: Never true    Ran Out of Food in the Last Year: Never true  Transportation Needs: No Transportation Needs (10/12/2023)   Received from [facility]    In the past 12 months, has lack of reliable transportation kept you from medical appointments, meetings, work or from getting things needed for daily living? : No  Physical Activity: Insufficiently Active (10/01/2021)   Received from [facility] visits prior to 01/30/2023., [facility] visits prior to 01/30/2023.   Exercise Vital Sign    Days of Exercise per Week: 4 days    Minutes of Exercise per Session: 20 min  Stress: Stress Concern Present (10/01/2021)   [facility] prior to 01/30/2023., [facility] visits prior to 01/30/2023.   [employer] of Occupational Health - Occupational Stress Questionnaire    Feeling of Stress : To some extent  Social Connections: Unknown (04/13/2022)   Received from [facility], [facility]   Social Network    Social Network: Not on file  Intimate Partner Violence: Unknown (03/05/2022)   Received from [facility], [facility]   HITS    Physically Hurt: Not on file    Insult or Talk Down To: Not on file    Threaten Physical Harm: Not on file    Scream or Curse: Not on file    Family History  Problem Relation Age of Onset   Stroke Mother 13       arterial wall of artery in eye    Endometrial cancer Mother 69   Dementia Father    Stroke Father        "lots of many strokes"   Aneurysm Father 1       AAA   Multiple births Maternal Grandmother        died 5 days after delivery of unknown causes   Colon cancer Maternal Aunt        late 29s   Cancer Cousin        maternal cousin with an unknown form of cancer   Breast cancer Neg Hx    Additional social history is that I take care of her  husband Mr. Sharonna Karrer, who was recently admitted to [facility] assisted living in June 2024 for progressive dementia. There is no history of ever smoking tobacco. Occasional alcohol use.   ROS General: Negative; No fevers, chills, or night sweats;  HEENT: Negative; No changes in vision or hearing, sinus  congestion, difficulty swallowing Pulmonary: Negative; No cough, wheezing, shortness of breath, hemoptysis Cardiovascular: Negative; No chest pain, presyncope, syncope, palpitations GI: Negative; No nausea, vomiting, diarrhea, or abdominal pain GU: Negative; No dysuria, hematuria, or difficulty voiding Musculoskeletal: status post successful knee surgery Rheumatology: Lupus Hematologic/Oncology: Positive for endometrial cancer Endocrine: Negative; no heat/cold intolerance; no diabetes Neuro: Positive for history of migraine headaches Skin: Negative; No rashes or skin lesions Psychiatric: Negative; No behavioral problems, depression Sleep: Negative; No snoring, daytime sleepiness, hypersomnolence, bruxism, restless legs, hypnogognic hallucinations, no cataplexy Other comprehensive 14 point system review is negative.  PE BP 104/68   Pulse 95   Ht 5' 6.5" (1.689 m)   Wt 158 lb 3.2 oz (71.8 kg)   SpO2 95%   BMI 25.15 kg/m    Repeat blood pressure by me 116/64  Wt Readings from Last 3 Encounters:  11/04/23 158 lb 3.2 oz (71.8 kg)  02/25/23 153 lb (69.4 kg)  09/15/22 150 lb (68 kg)   General: Alert, oriented, no distress.  Skin: normal turgor, no rashes, warm and dry HEENT: Normocephalic, atraumatic. Pupils equal round and reactive to light; sclera anicteric; extraocular muscles intact;  Nose without nasal septal hypertrophy Mouth/Parynx benign; Mallinpatti scale 2 Neck: No JVD, no carotid bruits; normal carotid upstroke Lungs: clear to ausculatation and percussion; no wheezing or rales Chest wall: without tenderness to palpitation Heart: PMI not displaced, RRR, s1 s2  normal, 1/6 systolic murmur, no diastolic murmur, no rubs, gallops, thrills, or heaves Abdomen: soft, nontender; no hepatosplenomehaly, BS+; abdominal aorta nontender and not dilated by palpation. Back: no CVA tenderness Pulses 2+ Musculoskeletal: full range of motion, normal strength, no joint deformities Extremities: no clubbing cyanosis or edema, Homan's sign negative  Neurologic: grossly nonfocal; Cranial nerves grossly wnl Psychologic: Normal mood and affect  EKG Interpretation Date/Time:  Thursday November 04 2023 11:52:40 EST Ventricular Rate:  95 PR Interval:  182 QRS Duration:  82 QT Interval:  360 QTC Calculation: 452 R Axis:   -42  Text Interpretation: Normal sinus rhythm Left axis deviation When compared with ECG of 31-Mar-2018 14:15, QRS axis Shifted left Confirmed by Nicki Guadalajara ([id number]) on 11/06/2023 11:58:16 AM     February 25, 2023 ECG (independently read by me): NSR at 81, QS V1-3  February 12, 2022 ECG (independently read by me):  NSR at 74; PRWP V1-3  January 06, 2021 ECG (independently read by me): NSR at 70; Q in III; no ectopy  October 2020  ECG (independently read by me): Normal sinus rhythm at 96 bpm.  Nonspecific ST changes.  Normal intervals.  No ectopy.  March 2020 ECG (independently read by me): Sinus rhythm at 85 bpm.  No ectopy.  Normal intervals.  September 2019 ECG (independently read by me): Normal sinus rhythm at 91 bpm.  Normal intervals.  No ectopy.  May 2019 ECG (independently read by me): Normal sinus rhythm at 84 bpm.  Possible borderline left atrial enlargement.  No ectopy. Normal intervals.  No evidence for prior MI.  Q waves in lead III.  Normal R waves in lead II and aVF  September 2016 ECG (independently read by me): Normal sinus rhythm at 74 bpm.  No ectopy.  Normal intervals.  ECG: Sinus rhythm at 69 beats per minute. No ectopy. Normal intervals.  LABS:    Latest Ref Rng & Units 02/12/2022    9:24 AM 02/10/2021    8:57 AM 03/14/2020    10:21 AM  BMP  Glucose 70 - 99 mg/dL  86  90  88   BUN 8 - 27 mg/dL 14  15  15    Creatinine 0.57 - 1.00 mg/dL 4.09  8.11  9.14   BUN/Creat Ratio 12 - 28 18  21  20    Sodium 134 - 144 mmol/L 144  141  143   Potassium 3.5 - 5.2 mmol/L 4.5  4.9  4.3   Chloride 96 - 106 mmol/L 106  105  106   CO2 20 - 29 mmol/L 27  22  21    Calcium 8.7 - 10.3 mg/dL 9.5  9.2  9.3       Latest Ref Rng & Units 02/12/2022    9:24 AM 02/10/2021    8:57 AM 03/14/2020   10:21 AM  Hepatic Function  Total Protein 6.0 - 8.5 g/dL 6.3  6.1  6.4   Albumin 3.7 - 4.7 g/dL 4.4  4.3  4.2   AST 0 - 40 IU/L 23  26  28    ALT 0 - 32 IU/L 29  26  31    Alk Phosphatase 44 - 121 IU/L 87  105  113   Total Bilirubin 0.0 - 1.2 mg/dL 0.3  0.2  0.2       Latest Ref Rng & Units 02/12/2022    9:24 AM 02/10/2021    8:57 AM 02/15/2020    4:03 PM  CBC  WBC 3.4 - 10.8 x10E3/uL 6.9  6.6  6.7   Hemoglobin 11.1 - 15.9 g/dL 78.2  95.6  21.3   Hematocrit 34.0 - 46.6 % 41.6  41.9  41.9   Platelets 150 - 450 x10E3/uL 226  211  258.0    Lab Results  Component Value Date   MCV 94 02/12/2022   MCV 92 02/10/2021   MCV 91.3 02/15/2020   Lab Results  Component Value Date   TSH 1.46 02/15/2020    No results found for: "HGBA1C"   Lipid Panel     Component Value Date/Time   CHOL 149 02/12/2022 0924   TRIG 108 02/12/2022 0924   HDL 66 02/12/2022 0924   CHOLHDL 2.3 02/12/2022 0924   CHOLHDL 3.7 09/06/2015 0914   VLDL 32 (H) 09/06/2015 0914   LDLCALC 64 02/12/2022 0924     RADIOLOGY: No results found.  Prior  2016 NMR lipoprotein: significant increase LDL particle #2251, LDL calculated 152, HDL 54, triglycerides 180, total cholesterol 242, small LDL particle #1427, and insulin resistance coronary increased at 49.   IMPRESSION:  1. Hyperlipidemia LDL goal <70   2. Family history of premature CAD   3. Hypothyroidism, unspecified type   4. Systolic Murmur   5. Systemic lupus erythematosus, unspecified SLE type, unspecified organ  involvement status (HCC)    ASSESSMENT AND PLAN: Brooke Williams is a 77 year-old female who has a strong family history for premature coronary artery disease with several family members having undergone cardiovascular interventions.  She has a history of hyperlipidemia and has had issues of statin induced myalgia.  She was able to be reinstituted and tolerated Crestor 5 mg but did not seem to tolerate the increased regimen to 10 mg.  As result subsequently she had reduced her dose back to 5 mg and since August 2020 discontinue therapy altogether.  On subsequent evaluation she had been started on Zetia which she initially tolerated but subsequently stopped this due to complaints that it made her tired.    LDL cholesterol in March 2022 was 98.  She previously had been taking  off Plaquenil when her lupus was in remission but subsequently was resumed at 100 mg twice a day by Dr. Dierdre Forth.  On low-dose rosuvastatin, LDL cholesterol had reduced to 83.  Apparently, she stopped taking rosuvastatin for 6 months and LDL cholesterol in November had increased back to 134 with total cholesterol 227.  She retried starting 5 mg of rosuvastatin for January and February but due to similar complaints has been off therapy and now is on Zetia 10 mg for which she is tolerating.  She is now followed by Dr. Gaspar Skeeters at [facility].  Most recent lipid panel on October 13, 2023 showed total cholesterol 205, triglycerides 119, HDL 78, and LDL 105.  In the past we had discussed possible bempedoic acid initiation.  Recently, she has had some nasal congestion and has been taking Sudafed which may account for increased pulse rate today in the 90s.  She has a 1/6 systolic murmur in her aortic area.  Her last echo Doppler study was in 2012.  I have recommended a follow-up echo Doppler study in the spring 2025.  She is significantly stressed with her husband's rapid deterioration with progressive dementia and now essentially is  aphasic currently residing at [facility] assisted living.  I discussed with her my plans for future retirement in 2025.  She is now followed by Dr. Gaspar Skeeters.  Following my retirement, I will transition her to the care of Dr. Epifanio Lesches for follow-up cardiologic assessment.   Lennette Bihari, MD, [facility]  11/06/2023 12:04 PM

## 2023-11-04 NOTE — Patient Instructions (Signed)
Medication Instructions:  Continue to take the Lovaz *If you need a refill on your cardiac medications before your next appointment, please call your pharmacy*   Lab Work: None If you have labs (blood work) drawn today and your tests are completely normal, you will receive your results only by: MyChart Message (if you have MyChart) OR A paper copy in the mail If you have any lab test that is abnormal or we need to change your treatment, we will call you to review the results.   Testing/Procedures: Your physician has requested that you have an echocardiogram in February 2025. Echocardiography is a painless test that uses sound waves to create images of your heart. It provides your doctor with information about the size and shape of your heart and how well your heart's chambers and valves are working. This procedure takes approximately one hour. There are no restrictions for this procedure. Please do NOT wear cologne, perfume, aftershave, or lotions (deodorant is allowed). Please arrive 15 minutes prior to your appointment time.  Please note: We ask at that you not bring children with you during ultrasound (echo/ vascular) testing. Due to room size and safety concerns, children are not allowed in the ultrasound rooms during exams. Our front office staff cannot provide observation of children in our lobby area while testing is being conducted. An adult accompanying a patient to their appointment will only be allowed in the ultrasound room at the discretion of the ultrasound technician under special circumstances. We apologize for any inconvenience.    Follow-Up: At [facility], you and your health needs are our priority.  As part of our continuing mission to provide you with exceptional heart care, we have created designated Provider Care Teams.  These Care Teams include your primary Cardiologist (physician) and Advanced Practice Providers (APPs -  Physician Assistants and Nurse  Practitioners) who all work together to provide you with the care you need, when you need it.  We recommend signing up for the patient portal called "MyChart".  Sign up information is provided on this After Visit Summary.  MyChart is used to connect with patients for Virtual Visits (Telemedicine).  Patients are able to view lab/test results, encounter notes, upcoming appointments, etc.  Non-urgent messages can be sent to your provider as well.   To learn more about what you can do with MyChart, go to [email or web address].    Your next appointment:   1 year(s)  Provider:   Dr. Epifanio Lesches

## 2023-11-06 ENCOUNTER — Encounter: Payer: Self-pay | Admitting: Cardiovascular Disease

## 2023-12-16 ENCOUNTER — Ambulatory Visit (HOSPITAL_COMMUNITY): Payer: Medicare HMO | Attending: Internal Medicine

## 2023-12-16 DIAGNOSIS — R011 Cardiac murmur, unspecified: Secondary | ICD-10-CM | POA: Insufficient documentation

## 2023-12-16 LAB — ECHOCARDIOGRAM COMPLETE
AR max vel: 2.49 cm2
AV Area VTI: 2.17 cm2
AV Area mean vel: 2.3 cm2
AV Mean grad: 2.8 mm[Hg]
AV Peak grad: 4.8 mm[Hg]
AV Vena cont: 0.29 cm
Ao pk vel: 1.1 m/s
S' Lateral: 2.35 cm

## 2023-12-25 ENCOUNTER — Encounter: Payer: Self-pay | Admitting: Cardiovascular Disease

## 2024-02-21 ENCOUNTER — Other Ambulatory Visit: Payer: Self-pay | Admitting: Family Medicine

## 2024-02-21 DIAGNOSIS — Z1231 Encounter for screening mammogram for malignant neoplasm of breast: Secondary | ICD-10-CM

## 2024-03-03 ENCOUNTER — Ambulatory Visit
Admission: RE | Admit: 2024-03-03 | Discharge: 2024-03-03 | Disposition: A | Discharge status: Home / Self Care | Source: Ambulatory Visit | Attending: Family Medicine | Admitting: Family Medicine

## 2024-03-03 DIAGNOSIS — Z1231 Encounter for screening mammogram for malignant neoplasm of breast: Secondary | ICD-10-CM

## 2024-04-26 LAB — LAB REPORT - SCANNED
EGFR: 89
POC INR: 5
TSH: 1.48 (ref 0.41–5.90)

## 2024-05-15 ENCOUNTER — Ambulatory Visit: Payer: Self-pay | Admitting: Cardiovascular Disease

## 2024-05-18 ENCOUNTER — Encounter: Payer: Self-pay | Admitting: Cardiovascular Disease

## 2024-05-23 NOTE — Telephone Encounter (Signed)
 Spoke with pt regarding needing an office visit to see Dr. Kate. Offered pt appointment for tomorrow, unfortunately she is out of town right now. Scheduled pt to see Dr. Kate in October. Pt verbalizes understanding.

## 2024-09-03 NOTE — Progress Notes (Deleted)
 Cardiology Office Note:    Date:  09/03/2024   ID:  Brooke Williams, DOB 08/03/1946, MRN [id number]  PCP:  Leila Lucie LABOR, MD  Cardiologist:  Debby Sor, MD (Inactive)  Electrophysiologist:  None   Referring MD: Leila Lucie LABOR, MD   No chief complaint on file. ***  History of Present Illness:    Brooke Williams is a 78 y.o. female with a hx of lupus, hyperlipidemia, endometrial cancer, hypothyroidism, OSA who presents for follow-up.  Previously followed with Dr. Sor.  ETT 10/01/2015 showed no evidence of ischemia.  Echocardiogram 12/2023 showed EF 60 to 65%, mild RV dysfunction, mild to moderate aortic regurgitation.  Past Medical History:  Diagnosis Date   Arthritis    arthritis-hands. cervical disc degeneration   Back pain    6th vertebrae has collapse on 7th,pinched nerve feeling occ   Dizziness    rarely,takes Meclizine if needed   Endometrial cancer (HCC) 2014   GERD (gastroesophageal reflux disease)    controls with Zantac   Headache(784.0)    headaches frequently but states since placed on 2L/m via N/C headaches have improved   History of colon polyps    benign   History of staph infection    Hyperlipidemia    takes krill oil daily   Hypothyroidism    takes Synthroid  daily   Ileus (HCC) 10/2015   was treated   Joint pain    Joint swelling    Lupus    remission since 2008   Nocturia    OSA (obstructive sleep apnea)    sleep study in epic from 11-01-15.Uses Oxygen   Pneumonia    hx of-within the last 15 yrs   PONV (postoperative nausea and vomiting)    Restless leg syndrome    takes Mirapex  nightly   Seasonal allergies    takes Allegra daily and uses Astelin  Nasal Spray as needed    Past Surgical History:  Procedure Laterality Date   ABDOMINAL HYSTERECTOMY     BRAIN SURGERY     left cataract   COLON SURGERY     12/2017   COLONOSCOPY     DILATION AND CURETTAGE OF UTERUS     2008-polyp removed   HAND SURGERY  2006   left thumb  surgery with pin   KNEE ARTHROPLASTY     KNEE ARTHROSCOPY Left 04/08/2018   Procedure: ARTHROSCOPY LEFT KNEE;  Surgeon: Liam Lerner, MD;  Location: [facility];  Service: Orthopedics;  Laterality: Left;   lipomas removed from back   40 yrs ago   MASS EXCISION N/A 10/05/2014   Procedure: EXCISION OF BILATERAL LOWER BACK MASSES;  Surgeon: Vicenta Poli, MD;  Location: [facility];  Service: General;  Laterality: N/A;   MEDIAL PARTIAL KNEE REPLACEMENT  2005   LT knee   OTHER SURGICAL HISTORY     metal pin/screw to repair arthritis in LT thumb joint   ROBOTIC ASSISTED TOTAL HYSTERECTOMY WITH BILATERAL SALPINGO OOPHERECTOMY  12/20/2012   Procedure: ROBOTIC ASSISTED TOTAL HYSTERECTOMY WITH BILATERAL SALPINGO OOPHORECTOMY;  Surgeon: Elenore A. Dodie, MD;  Location: [facility];  Service: Gynecology;  Laterality: N/A;  WITH PELVIC PARAAORTIC    ROBOTIC PELVIC AND PARA-AORTIC LYMPH NODE DISSECTION  12/20/2012   Procedure: ROBOTIC PELVIC AND PARA-AORTIC LYMPH NODE DISSECTION;  Surgeon: Elenore A. Dodie, MD;  Location: [facility];  Service: Gynecology;  Laterality: Bilateral;   TOTAL KNEE ARTHROPLASTY Right 04/08/2018   Procedure: RIGHT TOTAL KNEE ARTHROPLASTY;  Surgeon: Liam Lerner, MD;  Location: [facility];  Service: Orthopedics;  Laterality: Right;  Right TKA and Left Knee Arthroscopy   TOTAL KNEE REVISION Left 02/03/2016   Procedure: TOTAL KNEE REVISION;  Surgeon: Dempsey Sensor, MD;  Location: [facility];  Service: Orthopedics;  Laterality: Left;   WRIST SURGERY Left 06/07/2019   Dr. Sebastian     Current Medications: No outpatient medications have been marked as taking for the 09/06/24 encounter (Appointment) with Kate Lonni CROME, MD.     Allergies:   Cefdinir, Penicillins, Tape, and Penicillamine   Social History   Socioeconomic History   Marital status: Married    Spouse name: Prentice   Number of children: 0   Years of education: 12   Highest education level: High school graduate  Occupational  History   Occupation: retired  Tobacco Use   Smoking status: Never   Smokeless tobacco: Never  Vaping Use   Vaping status: Never Used  Substance and Sexual Activity   Alcohol  use: Not Currently    Comment: rarely   Drug use: No   Sexual activity: Not Currently    Birth control/protection: Post-menopausal  Other Topics Concern   Not on file  Social History Narrative   Patient does not have any biological children but has step children      Right handed   Two story    Lives with spouse   Social Drivers of Health   Financial Resource Strain: Low Risk  (10/01/2021)   Received from [facility] visits prior to 01/30/2023.   Overall Financial Resource Strain (CARDIA)    Difficulty of Paying Living Expenses: Not hard at all  Food Insecurity: Low Risk  (10/12/2023)   Received from [facility]   Hunger Vital Sign    Within the past 12 months, you worried that your food would run out before you got money to buy more: Never true    Within the past 12 months, the food you bought just didn't last and you didn't have money to get more. : Never true  Transportation Needs: No Transportation Needs (10/12/2023)   Received from [facility]    In the past 12 months, has lack of reliable transportation kept you from medical appointments, meetings, work or from getting things needed for daily living? : No  Physical Activity: Insufficiently Active (10/01/2021)   Received from [facility] visits prior to 01/30/2023.   Exercise Vital Sign    On average, how many days per week do you engage in moderate to strenuous exercise (like a brisk walk)?: 4 days    On average, how many minutes do you engage in exercise at this level?: 20 min  Stress: Stress Concern Present (10/01/2021)   Received from [facility] visits prior to 01/30/2023.   [employer] of Occupational [employer] - Occupational Stress Questionnaire    Feeling of  Stress : To some extent  Social Connections: Unknown (04/13/2022)   Received from [facility]   Social Network    Social Network: Not on file     Family History: The patient's ***family history includes Aneurysm (age of onset: 18) in her father; Cancer in her cousin; Colon cancer in her maternal aunt; Dementia in her father; Endometrial cancer (age of onset: 79) in her mother; Multiple births in her maternal grandmother; Stroke in her father; Stroke (age of onset: 37) in her mother. There is no history of Breast cancer or BRCA 1/2.  ROS:  Please see the history of present illness.    *** All other systems reviewed and are negative.  EKGs/Labs/Other Studies Reviewed:    The following studies were reviewed today: ***  EKG:  EKG is *** ordered today.  The ekg ordered today demonstrates ***  Recent Labs: 04/26/2024: TSH 1.48  Recent Lipid Panel    Component Value Date/Time   CHOL 149 02/12/2022 0924   TRIG 108 02/12/2022 0924   HDL 66 02/12/2022 0924   CHOLHDL 2.3 02/12/2022 0924   CHOLHDL 3.7 09/06/2015 0914   VLDL 32 (H) 09/06/2015 0914   LDLCALC 64 02/12/2022 0924    Physical Exam:    VS:  There were no vitals taken for this visit.    Wt Readings from Last 3 Encounters:  11/04/23 158 lb 3.2 oz (71.8 kg)  02/25/23 153 lb (69.4 kg)  09/15/22 150 lb (68 kg)     GEN: *** Well nourished, well developed in no acute distress HEENT: Normal NECK: No JVD; No carotid bruits LYMPHATICS: No lymphadenopathy CARDIAC: ***RRR, no murmurs, rubs, gallops RESPIRATORY:  Clear to auscultation without rales, wheezing or rhonchi  ABDOMEN: Soft, non-tender, non-distended MUSCULOSKELETAL:  No edema; No deformity  SKIN: Warm and dry NEUROLOGIC:  Alert and oriented x 3 PSYCHIATRIC:  Normal affect   ASSESSMENT:    No diagnosis found. PLAN:    Hyperlipidemia: On Zetia  10 mg daily and rosuvastatin  5 mg daily  Aortic regurgitation:  Echocardiogram 12/2023 showed EF 60 to 65%, mild RV  dysfunction, mild to moderate aortic regurgitation. - Repeat echo in 1 year to monitor  RTC in***  Medication Adjustments/Labs and Tests Ordered: Current medicines are reviewed at length with the patient today.  Concerns regarding medicines are outlined above.  No orders of the defined types were placed in this encounter.  No orders of the defined types were placed in this encounter.   There are no Patient Instructions on file for this visit.   Signed, Lonni LITTIE Nanas, MD  09/03/2024 2:04 PM    [facility]

## 2024-09-06 ENCOUNTER — Ambulatory Visit: Admitting: Cardiology

## 2024-09-20 NOTE — Progress Notes (Unsigned)
 Cardiology Office Note:    Date:  09/22/2024   ID:  ZARINA PE, DOB Aug 09, 1946, MRN [id number]  PCP:  Leila Lucie LABOR, MD  Cardiologist:  Debby Sor, MD (Inactive)  Electrophysiologist:  None   Referring MD: Leila Lucie LABOR, MD   Chief Complaint  Patient presents with   Edema    History of Present Illness:    Brooke Williams is a 78 y.o. female with a hx of lupus, hyperlipidemia, endometrial cancer, hypothyroidism, OSA who presents for follow-up.  Previously followed with Dr. Sor.  ETT 10/01/2015 showed no evidence of ischemia.  Echocardiogram 12/2023 showed EF 60 to 65%, mild RV dysfunction, mild to moderate aortic regurgitation.  Since last clnic visit, she reports she is doing okay.  Reports having some lower extremity edema.  She uses compression stockings.  She has not been able to tolerate statins due to myalgias.  Currently on Zetia .  She denies any chest pain or dyspnea.  Reports occasional palpitations but just lasting for seconds.  Reports occasional lightheadedness but denies any syncope.  She has not been exercising.   Past Medical History:  Diagnosis Date   Arthritis    arthritis-hands. cervical disc degeneration   Back pain    6th vertebrae has collapse on 7th,pinched nerve feeling occ   Dizziness    rarely,takes Meclizine if needed   Endometrial cancer (HCC) 2014   GERD (gastroesophageal reflux disease)    controls with Zantac   Headache(784.0)    headaches frequently but states since placed on 2L/m via N/C headaches have improved   History of colon polyps    benign   History of staph infection    Hyperlipidemia    takes krill oil daily   Hypothyroidism    takes Synthroid  daily   Ileus (HCC) 10/2015   was treated   Joint pain    Joint swelling    Lupus    remission since 2008   Nocturia    OSA (obstructive sleep apnea)    sleep study in epic from 11-01-15.Uses Oxygen   Pneumonia    hx of-within the last 15 yrs   PONV  (postoperative nausea and vomiting)    Restless leg syndrome    takes Mirapex  nightly   Seasonal allergies    takes Allegra daily and uses Astelin  Nasal Spray as needed    Past Surgical History:  Procedure Laterality Date   ABDOMINAL HYSTERECTOMY     BRAIN SURGERY     left cataract   COLON SURGERY     12/2017   COLONOSCOPY     DILATION AND CURETTAGE OF UTERUS     2008-polyp removed   HAND SURGERY  2006   left thumb surgery with pin   KNEE ARTHROPLASTY     KNEE ARTHROSCOPY Left 04/08/2018   Procedure: ARTHROSCOPY LEFT KNEE;  Surgeon: Liam Lerner, MD;  Location: [facility];  Service: Orthopedics;  Laterality: Left;   lipomas removed from back   40 yrs ago   MASS EXCISION N/A 10/05/2014   Procedure: EXCISION OF BILATERAL LOWER BACK MASSES;  Surgeon: Vicenta Poli, MD;  Location: [facility];  Service: General;  Laterality: N/A;   MEDIAL PARTIAL KNEE REPLACEMENT  2005   LT knee   OTHER SURGICAL HISTORY     metal pin/screw to repair arthritis in LT thumb joint   ROBOTIC ASSISTED TOTAL HYSTERECTOMY WITH BILATERAL SALPINGO OOPHERECTOMY  12/20/2012   Procedure: ROBOTIC ASSISTED TOTAL HYSTERECTOMY WITH BILATERAL SALPINGO OOPHORECTOMY;  Surgeon:  Paola A. Dodie, MD;  Location: [facility];  Service: Gynecology;  Laterality: N/A;  WITH PELVIC PARAAORTIC    ROBOTIC PELVIC AND PARA-AORTIC LYMPH NODE DISSECTION  12/20/2012   Procedure: ROBOTIC PELVIC AND PARA-AORTIC LYMPH NODE DISSECTION;  Surgeon: Elenore A. Dodie, MD;  Location: [facility];  Service: Gynecology;  Laterality: Bilateral;   TOTAL KNEE ARTHROPLASTY Right 04/08/2018   Procedure: RIGHT TOTAL KNEE ARTHROPLASTY;  Surgeon: Liam Lerner, MD;  Location: [facility];  Service: Orthopedics;  Laterality: Right;  Right TKA and Left Knee Arthroscopy   TOTAL KNEE REVISION Left 02/03/2016   Procedure: TOTAL KNEE REVISION;  Surgeon: Lerner Liam, MD;  Location: [facility];  Service: Orthopedics;  Laterality: Left;   WRIST SURGERY Left 06/07/2019   Dr.  Sebastian     Current Medications: Current Meds  Medication Sig   Alpha-Lipoic Acid 600 MG CAPS Take by mouth daily.   CALCIUM  PO Take 743 mg by mouth daily.   dicyclomine (BENTYL) 20 MG tablet Take by mouth.   doxycycline  (VIBRA -TABS) 100 MG tablet Take 1 tablet by mouth daily.   ezetimibe  (ZETIA ) 10 MG tablet Take 1 tablet (10 mg total) by mouth daily.   famotidine  (PEPCID ) 20 MG tablet Take 20 mg by mouth as needed for heartburn or indigestion.   fluocinonide gel (LIDEX) 0.05 % APPLY TO AFFECTED AREA twice a day as needed for canker sores.   fluticasone (FLONASE) 50 MCG/ACT nasal spray Place into both nostrils 2 (two) times daily.   Multiple Vitamin (MULTIVITAMIN WITH MINERALS) TABS Take 1 tablet by mouth daily.   omega-3 acid ethyl esters (LOVAZA ) 1 g capsule Take 1 capsule (1 g total) by mouth daily.   Psyllium (METAMUCIL FIBER PO) Take 5 capsules by mouth daily.    SYNTHROID  75 MCG tablet Take 75 mcg by mouth daily before breakfast.    triamcinolone  (KENALOG ) 0.1 % Apply topically.     Allergies:   Cefdinir, Penicillins, Tape, and Penicillamine   Social History   Socioeconomic History   Marital status: Married    Spouse name: Prentice   Number of children: 0   Years of education: 12   Highest education level: High school graduate  Occupational History   Occupation: retired  Tobacco Use   Smoking status: Never   Smokeless tobacco: Never  Vaping Use   Vaping status: Never Used  Substance and Sexual Activity   Alcohol  use: Not Currently    Comment: rarely   Drug use: No   Sexual activity: Not Currently    Birth control/protection: Post-menopausal  Other Topics Concern   Not on file  Social History Narrative   Patient does not have any biological children but has step children      Right handed   Two story    Lives with spouse   Social Drivers of Health   Financial Resource Strain: Low Risk  (10/01/2021)   Received from [facility] visits  prior to 01/30/2023.   Overall Financial Resource Strain (CARDIA)    Difficulty of Paying Living Expenses: Not hard at all  Food Insecurity: Low Risk  (10/12/2023)   [facility] from [facility]   Hunger Vital Sign    Within the past 12 months, you worried that your food would run out before you got money to buy more: Never true    Within the past 12 months, the food you bought just didn't last and you didn't have money to get more. : Never true  Transportation Needs:  No Transportation Needs (10/12/2023)   Received from [facility]    In the past 12 months, has lack of reliable transportation kept you from medical appointments, meetings, work or from getting things needed for daily living? : No  Physical Activity: Insufficiently Active (10/01/2021)   Received from [facility] visits prior to 01/30/2023.   Exercise Vital Sign    On average, how many days per week do you engage in moderate to strenuous exercise (like a brisk walk)?: 4 days    On average, how many minutes do you engage in exercise at this level?: 20 min  Stress: Stress Concern Present (10/01/2021)   Received from [facility] visits prior to 01/30/2023.   [employer] of Occupational Health - Occupational Stress Questionnaire    Feeling of Stress : To some extent  Social Connections: Unknown (04/13/2022)   Received from [facility]   Social Network    Social Network: Not on file     Family History: The patient's family history includes Aneurysm (age of onset: 37) in her father; Cancer in her cousin; Colon cancer in her maternal aunt; Dementia in her father; Endometrial cancer (age of onset: 74) in her mother; Multiple births in her maternal grandmother; Stroke in her father; Stroke (age of onset: 61) in her mother. There is no history of Breast cancer or BRCA 1/2.  ROS:   Please see the history of present illness.     All other systems reviewed and are  negative.  EKGs/Labs/Other Studies Reviewed:    The following studies were reviewed today:   EKG:  EKG is not ordered today.    Recent Labs: 04/26/2024: TSH 1.48  Recent Lipid Panel    Component Value Date/Time   CHOL 149 02/12/2022 0924   TRIG 108 02/12/2022 0924   HDL 66 02/12/2022 0924   CHOLHDL 2.3 02/12/2022 0924   CHOLHDL 3.7 09/06/2015 0914   VLDL 32 (H) 09/06/2015 0914   LDLCALC 64 02/12/2022 0924    Physical Exam:    VS:  BP 135/79 (BP Location: Left Arm, Patient Position: Sitting, Cuff Size: Normal)   Pulse 91   Ht 5' 8.4 (1.737 m)   Wt 160 lb 8 oz (72.8 kg)   SpO2 98%   BMI 24.12 kg/m     Wt Readings from Last 3 Encounters:  09/22/24 160 lb 8 oz (72.8 kg)  11/04/23 158 lb 3.2 oz (71.8 kg)  02/25/23 153 lb (69.4 kg)     GEN:  Well nourished, well developed in no acute distress HEENT: Normal NECK: No JVD; No carotid bruits LYMPHATICS: No lymphadenopathy CARDIAC: RRR, no murmurs, rubs, gallops RESPIRATORY:  Clear to auscultation without rales, wheezing or rhonchi  ABDOMEN: Soft, non-tender, non-distended MUSCULOSKELETAL: trace edema SKIN: Warm and dry NEUROLOGIC:  Alert and oriented x 3 PSYCHIATRIC:  Normal affect   ASSESSMENT:    1. Aortic valve insufficiency, etiology of cardiac valve disease unspecified   2. Lower extremity edema   3. Hyperlipidemia, unspecified hyperlipidemia type    PLAN:    Lower extremity edema: Trace on exam today.  Suspect venous insufficiency.  Check BNP, CMET.  Encouraged use of compression stockings  Hyperlipidemia: On Zetia  10 mg daily.  LDL 122 03/2024.  Has been unable to tolerate statins.  Recommend calcium  score for further risk stratification  Aortic regurgitation:  Echocardiogram 12/2023 showed EF 60 to 65%, mild RV dysfunction, mild to moderate aortic regurgitation. - Repeat echo  in 1 year to monitor  RTC in 6 months  Medication Adjustments/Labs and Tests Ordered: Current medicines are reviewed at length  with the patient today.  Concerns regarding medicines are outlined above.  Orders Placed This Encounter  Procedures   CT CARDIAC SCORING (SELF PAY ONLY)   Comprehensive Metabolic Panel (CMET)   B Nat Peptide   ECHOCARDIOGRAM COMPLETE   No orders of the defined types were placed in this encounter.   Patient Instructions  Medication Instructions:  Your physician recommends that you continue on your current medications as directed. Please refer to the Current Medication list given to you today.  *If you need a refill on your cardiac medications before your next appointment, please call your pharmacy*  Lab Work: Bnp, cmet If you have labs (blood work) drawn today and your tests are completely normal, you will receive your results only by: MyChart Message (if you have MyChart) OR A paper copy in the mail If you have any lab test that is abnormal or we need to change your treatment, we will call you to review the results.  Testing/Procedures: Echo  Your physician has requested that you have an echocardiogram. Echocardiography is a painless test that uses sound waves to create images of your heart. It provides your doctor with information about the size and shape of your heart and how well your heart's chambers and valves are working. This procedure takes approximately one hour. There are no restrictions for this procedure. Please do NOT wear cologne, perfume, aftershave, or lotions (deodorant is allowed). Please arrive 15 minutes prior to your appointment time.  Please note: We ask at that you not bring children with you during ultrasound (echo/ vascular) testing. Due to room size and safety concerns, children are not allowed in the ultrasound rooms during exams. Our front office staff cannot provide observation of children in our lobby area while testing is being conducted. An adult accompanying a patient to their appointment will only be allowed in the ultrasound room at the discretion of  the ultrasound technician under special circumstances. We apologize for any inconvenience.   Follow-Up: At [facility], you and your health needs are our priority.  As part of our continuing mission to provide you with exceptional heart care, our providers are all part of one team.  This team includes your primary Cardiologist (physician) and Advanced Practice Providers or APPs (Physician Assistants and Nurse Practitioners) who all work together to provide you with the care you need, when you need it.  Your next appointment:   6 month(s) post echo  Provider:   Dr. Kate  We recommend signing up for the patient portal called MyChart.  Sign up information is provided on this After Visit Summary.  MyChart is used to connect with patients for Virtual Visits (Telemedicine).  Patients are able to view lab/test results, encounter notes, upcoming appointments, etc.  Non-urgent messages can be sent to your provider as well.   To learn more about what you can do with MyChart, go to [email or web address].   Other Instructions Dr. Kate has ordered a CT coronary calcium  score.   Test locations:  [facility] at [facility] [place] [facility]  [place] [facility] at [facility]  This is $99 out of pocket.   Coronary CalciumScan A coronary calcium  scan is an imaging test used to look for deposits of calcium  and other fatty materials (plaques) in the inner lining of the blood  vessels of the heart (coronary arteries). These deposits of calcium  and plaques can partly clog and narrow the coronary arteries without producing any symptoms or warning signs. This puts a person at risk for a heart attack. This test can detect these deposits before symptoms develop. Tell a health care provider about: Any allergies you have. All medicines you are taking, including vitamins, herbs, eye drops, creams, and  over-the-counter medicines. Any problems you or family members have had with anesthetic medicines. Any blood disorders you have. Any surgeries you have had. Any medical conditions you have. Whether you are pregnant or may be pregnant. What are the risks? Generally, this is a safe procedure. However, problems may occur, including: Harm to a pregnant woman and her unborn baby. This test involves the use of radiation. Radiation exposure can be dangerous to a pregnant woman and her unborn baby. If you are pregnant, you generally should not have this procedure done. Slight increase in the risk of cancer. This is because of the radiation involved in the test. What happens before the procedure? No preparation is needed for this procedure. What happens during the procedure? You will undress and remove any jewelry around your neck or chest. You will put on a hospital gown. Sticky electrodes will be placed on your chest. The electrodes will be connected to an electrocardiogram (ECG) machine to record a tracing of the electrical activity of your heart. A CT scanner will take pictures of your heart. During this time, you will be asked to lie still and hold your breath for 2-3 seconds while a picture of your heart is being taken. The procedure may vary among health care providers and hospitals. What happens after the procedure? You can get dressed. You can return to your normal activities. It is up to you to get the results of your test. Ask your health care provider, or the department that is doing the test, when your results will be ready. Summary A coronary calcium  scan is an imaging test used to look for deposits of calcium  and other fatty materials (plaques) in the inner lining of the blood vessels of the heart (coronary arteries). Generally, this is a safe procedure. Tell your health care provider if you are pregnant or may be pregnant. No preparation is needed for this procedure. A CT scanner  will take pictures of your heart. You can return to your normal activities after the scan is done. This information is not intended to replace advice given to you by your health care provider. Make sure you discuss any questions you have with your health care provider. Document Released: 05/14/2008 Document Revised: 10/05/2016 Document Reviewed: 10/05/2016 Elsevier Interactive Patient Education  2017 Elsevier Inc.            Signed, Lonni LITTIE Nanas, MD  09/22/2024 5:11 PM     [facility]

## 2024-09-22 ENCOUNTER — Telehealth: Payer: Self-pay | Admitting: *Deleted

## 2024-09-22 ENCOUNTER — Ambulatory Visit: Attending: Cardiology | Admitting: Cardiology

## 2024-09-22 VITALS — BP 135/79 | HR 91 | Ht 68.4 in | Wt 160.5 lb

## 2024-09-22 DIAGNOSIS — R600 Localized edema: Secondary | ICD-10-CM | POA: Diagnosis not present

## 2024-09-22 DIAGNOSIS — E785 Hyperlipidemia, unspecified: Secondary | ICD-10-CM | POA: Diagnosis not present

## 2024-09-22 DIAGNOSIS — I351 Nonrheumatic aortic (valve) insufficiency: Secondary | ICD-10-CM

## 2024-09-22 NOTE — Telephone Encounter (Signed)
 Called the patient and made her aware that Dr. Kate order ECHO. Made pt aware that she can have calcium  and ECHO scheduled after blood work Monday 10/27. Pt verbalized an understanding.

## 2024-09-22 NOTE — Patient Instructions (Addendum)
 Medication Instructions:  Your physician recommends that you continue on your current medications as directed. Please refer to the Current Medication list given to you today.  *If you need a refill on your cardiac medications before your next appointment, please call your pharmacy*  Lab Work: Bnp, cmet If you have labs (blood work) drawn today and your tests are completely normal, you will receive your results only by: MyChart Message (if you have MyChart) OR A paper copy in the mail If you have any lab test that is abnormal or we need to change your treatment, we will call you to review the results.  Testing/Procedures: Echo  Your physician has requested that you have an echocardiogram. Echocardiography is a painless test that uses sound waves to create images of your heart. It provides your doctor with information about the size and shape of your heart and how well your heart's chambers and valves are working. This procedure takes approximately one hour. There are no restrictions for this procedure. Please do NOT wear cologne, perfume, aftershave, or lotions (deodorant is allowed). Please arrive 15 minutes prior to your appointment time.  Please note: We ask at that you not bring children with you during ultrasound (echo/ vascular) testing. Due to room size and safety concerns, children are not allowed in the ultrasound rooms during exams. Our front office staff cannot provide observation of children in our lobby area while testing is being conducted. An adult accompanying a patient to their appointment will only be allowed in the ultrasound room at the discretion of the ultrasound technician under special circumstances. We apologize for any inconvenience.   Follow-Up: At [facility], you and your health needs are our priority.  As part of our continuing mission to provide you with exceptional heart care, our providers are all part of one team.  This team includes your primary  Cardiologist (physician) and Advanced Practice Providers or APPs (Physician Assistants and Nurse Practitioners) who all work together to provide you with the care you need, when you need it.  Your next appointment:   6 month(s) post echo  Provider:   Dr. Kate  We recommend signing up for the patient portal called MyChart.  Sign up information is provided on this After Visit Summary.  MyChart is used to connect with patients for Virtual Visits (Telemedicine).  Patients are able to view lab/test results, encounter notes, upcoming appointments, etc.  Non-urgent messages can be sent to your provider as well.   To learn more about what you can do with MyChart, go to [email or web address].   Other Instructions Dr. Kate has ordered a CT coronary calcium  score.   Test locations:  [facility] at [facility] [place] [facility] [place]  [place] [facility] at [facility]  This is $99 out of pocket.   Coronary CalciumScan A coronary calcium  scan is an imaging test used to look for deposits of calcium  and other fatty materials (plaques) in the inner lining of the blood vessels of the heart (coronary arteries). These deposits of calcium  and plaques can partly clog and narrow the coronary arteries without producing any symptoms or warning signs. This puts a person at risk for a heart attack. This test can detect these deposits before symptoms develop. Tell a health care provider about: Any allergies you have. All medicines you are taking, including vitamins, herbs, eye drops, creams, and over-the-counter medicines. Any problems you or family members have had with anesthetic medicines. Any  blood disorders you have. Any surgeries you have had. Any medical conditions you have. Whether you are pregnant or may be pregnant. What are the risks? Generally, this is a safe procedure. However, problems may occur, including: Harm  to a pregnant woman and her unborn baby. This test involves the use of radiation. Radiation exposure can be dangerous to a pregnant woman and her unborn baby. If you are pregnant, you generally should not have this procedure done. Slight increase in the risk of cancer. This is because of the radiation involved in the test. What happens before the procedure? No preparation is needed for this procedure. What happens during the procedure? You will undress and remove any jewelry around your neck or chest. You will put on a hospital gown. Sticky electrodes will be placed on your chest. The electrodes will be connected to an electrocardiogram (ECG) machine to record a tracing of the electrical activity of your heart. A CT scanner will take pictures of your heart. During this time, you will be asked to lie still and hold your breath for 2-3 seconds while a picture of your heart is being taken. The procedure may vary among health care providers and hospitals. What happens after the procedure? You can get dressed. You can return to your normal activities. It is up to you to get the results of your test. Ask your health care provider, or the department that is doing the test, when your results will be ready. Summary A coronary calcium  scan is an imaging test used to look for deposits of calcium  and other fatty materials (plaques) in the inner lining of the blood vessels of the heart (coronary arteries). Generally, this is a safe procedure. Tell your health care provider if you are pregnant or may be pregnant. No preparation is needed for this procedure. A CT scanner will take pictures of your heart. You can return to your normal activities after the scan is done. This information is not intended to replace advice given to you by your health care provider. Make sure you discuss any questions you have with your health care provider. Document Released: 05/14/2008 Document Revised: 10/05/2016 Document  Reviewed: 10/05/2016 Elsevier Interactive Patient Education  2017 [employer].

## 2024-09-25 ENCOUNTER — Ambulatory Visit (HOSPITAL_COMMUNITY)
Admission: RE | Admit: 2024-09-25 | Discharge: 2024-09-25 | Disposition: A | Discharge status: Home / Self Care | Source: Ambulatory Visit | Attending: Cardiology | Admitting: Cardiology

## 2024-09-25 ENCOUNTER — Other Ambulatory Visit: Payer: Self-pay

## 2024-09-25 DIAGNOSIS — I351 Nonrheumatic aortic (valve) insufficiency: Secondary | ICD-10-CM | POA: Diagnosis present

## 2024-09-25 DIAGNOSIS — E785 Hyperlipidemia, unspecified: Secondary | ICD-10-CM | POA: Insufficient documentation

## 2024-09-25 DIAGNOSIS — R0609 Other forms of dyspnea: Secondary | ICD-10-CM

## 2024-09-25 DIAGNOSIS — R600 Localized edema: Secondary | ICD-10-CM

## 2024-09-25 LAB — ECHOCARDIOGRAM COMPLETE
AR max vel: 2.5 cm2
AV Area VTI: 2.77 cm2
AV Area mean vel: 2.34 cm2
AV Mean grad: 3 mmHg
AV Peak grad: 4.8 mmHg
Ao pk vel: 1.1 m/s
Area-P 1/2: 3.77 cm2
P 1/2 time: 541 ms
S' Lateral: 2 cm

## 2024-09-25 NOTE — Addendum Note (Signed)
 Addended by: MANDA BOTTCHER B on: 09/25/2024 08:54 AM   Modules accepted: Orders

## 2024-09-25 NOTE — Progress Notes (Addendum)
 Needed to reorder pro-bnp

## 2024-09-26 ENCOUNTER — Ambulatory Visit: Payer: Self-pay | Admitting: Cardiology

## 2024-09-26 DIAGNOSIS — E785 Hyperlipidemia, unspecified: Secondary | ICD-10-CM

## 2024-09-26 LAB — COMPREHENSIVE METABOLIC PANEL WITH GFR
ALT: 33 IU/L — AB (ref 0–32)
AST: 30 IU/L (ref 0–40)
Albumin: 4.2 g/dL (ref 3.8–4.8)
Alkaline Phosphatase: 90 IU/L (ref 49–135)
BUN/Creatinine Ratio: 26 (ref 12–28)
BUN: 19 mg/dL (ref 8–27)
Bilirubin Total: 0.3 mg/dL (ref 0.0–1.2)
CO2: 21 mmol/L (ref 20–29)
Calcium: 9.2 mg/dL (ref 8.7–10.3)
Chloride: 107 mmol/L — ABNORMAL HIGH (ref 96–106)
Creatinine, Ser: 0.74 mg/dL (ref 0.57–1.00)
Globulin, Total: 1.9 g/dL (ref 1.5–4.5)
Glucose: 79 mg/dL (ref 70–99)
Potassium: 4.4 mmol/L (ref 3.5–5.2)
Sodium: 144 mmol/L (ref 134–144)
Total Protein: 6.1 g/dL (ref 6.0–8.5)
eGFR: 84 mL/min/1.73 (ref >59)

## 2024-09-26 LAB — PRO B NATRIURETIC PEPTIDE: NT-Pro BNP: 51 pg/mL (ref 0–738)

## 2024-09-27 MED ORDER — ASPIRIN 81 MG PO TBEC: 81.0000 mg | DELAYED_RELEASE_TABLET | Freq: Every day | ORAL | 3 refills | Status: AC

## 2024-10-02 ENCOUNTER — Ambulatory Visit: Attending: Cardiology | Admitting: Pharmacist

## 2024-10-02 DIAGNOSIS — E7800 Pure hypercholesterolemia, unspecified: Secondary | ICD-10-CM | POA: Diagnosis not present

## 2024-10-02 NOTE — Assessment & Plan Note (Addendum)
 Assessment: - LDL-C from May 2025 of 112 mg/dL remains above goal of < 70 mg/dL - Calcium  scoring completed in October 2025 placed her in the 86th percentile for her age - Currently on ezetimibe  monotherapy and is intolerant to statins - Discussed Repatha injection technique, adverse effects, dosing  Plan: - Continue ezetimibe  therapy - Start the insurance approval process for Repatha and recheck labs in 3 months

## 2024-10-02 NOTE — Progress Notes (Signed)
 Patient ID: Brooke Williams                 DOB: [birth year]                    MRN: [id number]      HPI: Brooke Williams is a 78 y.o. female patient referred to lipid clinic by  Dr. Kate. PMH is significant for Lupus, HLD, OSA, aortic valve insufficiency.  Calcium  scoring done on 09/25/2024 showed a score of 548, 86th percentile. Lipid panel in 03/2024 showed LDL-C 112, TG 136, TC 202 while on ezetimibe  monotherapy.   Brooke Williams presents to clinic today to discuss medication options for lipid lowering. She reports that she has experienced adverse effects with rosuvastatin  and pitavastatin  including myalgias. This reduced her ability to ambulate and made her feet feel heavy. This went away fairly quickly after stopping the statin both times. She has been tolerating the ezetimibe  well.   She is a good candidate for Repatha. Explained the dosing, injection technique, and insurance approval process. Also discussed the common adverse effects with Repatha therapy. She is concerned about using Repatha with her Lupus diagnosis. Explained that this likely will not interfere with Repatha therapy given how it works in the body, but will look into it further and follow up with her.   Discussed that diet and exercise can help control cholesterol levels in addition to medical therapies. She stated her understanding and has no further questions at this time.  Current Medications: ezetimibe  10 mg daily Intolerances: rosuvastatin , pitavastatin  (myalgias) Risk Factors: CAD on calcium  scoring LDL-C goal: < 70 mg/dL ApoB goal: < 80 mg/dL  Exercise: limited by caregiving of husband, plans to work on this in the coming months  Family History:  Family History  Problem Relation Age of Onset   Stroke Mother 15       arterial wall of artery in eye    Endometrial cancer Mother 26   Dementia Father    Stroke Father        lots of many strokes   Aneurysm Father 13       AAA   Colon cancer  Maternal Aunt        late 32s   Multiple births Maternal Grandmother        died 5 days after delivery of unknown causes   Cancer Cousin        maternal cousin with an unknown form of cancer   Breast cancer Neg Hx    BRCA 1/2 Neg Hx    Social History:   Labs: Lipid Panel     Component Value Date/Time   CHOL 149 02/12/2022 0924   TRIG 108 02/12/2022 0924   HDL 66 02/12/2022 0924   CHOLHDL 2.3 02/12/2022 0924   CHOLHDL 3.7 09/06/2015 0914   VLDL 32 (H) 09/06/2015 0914   LDLCALC 64 02/12/2022 0924   LABVLDL 19 02/12/2022 0924    Past Medical History:  Diagnosis Date   Arthritis    arthritis-hands. cervical disc degeneration   Back pain    6th vertebrae has collapse on 7th,pinched nerve feeling occ   Dizziness    rarely,takes Meclizine if needed   Endometrial cancer (HCC) 2014   GERD (gastroesophageal reflux disease)    controls with Zantac   Headache(784.0)    headaches frequently but states since placed on 2L/m via N/C headaches have improved   History of colon polyps    benign   History of  staph infection    Hyperlipidemia    takes krill oil daily   Hypothyroidism    takes Synthroid  daily   Ileus (HCC) 10/2015   was treated   Joint pain    Joint swelling    Lupus    remission since 2008   Nocturia    OSA (obstructive sleep apnea)    sleep study in epic from 11-01-15.Uses Oxygen   Pneumonia    hx of-within the last 15 yrs   PONV (postoperative nausea and vomiting)    Restless leg syndrome    takes Mirapex  nightly   Seasonal allergies    takes Allegra daily and uses Astelin  Nasal Spray as needed    Current Outpatient Medications on File Prior to Visit  Medication Sig Dispense Refill   Alpha-Lipoic Acid 600 MG CAPS Take by mouth daily.     aspirin  EC 81 MG tablet Take 1 tablet (81 mg total) by mouth daily. Swallow whole. 90 tablet 3   CALCIUM  PO Take 743 mg by mouth daily.     doxycycline  (VIBRA -TABS) 100 MG tablet Take 1 tablet by mouth daily. (Patient  taking differently: Take 50 mg by mouth 2 (two) times daily.)     ezetimibe  (ZETIA ) 10 MG tablet Take 1 tablet (10 mg total) by mouth daily. 90 tablet 3   famotidine  (PEPCID ) 20 MG tablet Take 20 mg by mouth as needed for heartburn or indigestion.     fluocinonide gel (LIDEX) 0.05 % APPLY TO AFFECTED AREA twice a day as needed for canker sores.  1   fluticasone (FLONASE) 50 MCG/ACT nasal spray Place into both nostrils 2 (two) times daily.     loratadine -pseudoephedrine  (CLARITIN -D 12-HOUR) 5-120 MG tablet Take 1 tablet by mouth daily as needed for allergies.     Multiple Vitamin (MULTIVITAMIN WITH MINERALS) TABS Take 1 tablet by mouth daily.     omega-3 acid ethyl esters (LOVAZA ) 1 g capsule Take 1 capsule (1 g total) by mouth daily. 90 capsule 3   Psyllium (METAMUCIL FIBER PO) Take 5 capsules by mouth daily.      SYNTHROID  75 MCG tablet Take 75 mcg by mouth daily before breakfast.      triamcinolone  (KENALOG ) 0.1 % Apply topically.     UNABLE TO FIND Med Name: Turkey Tail Mushroom Capsule     No current facility-administered medications on file prior to visit.    Allergies  Allergen Reactions   Cefdinir Hives, Itching and Rash   Penicillins Hives    CHILDHOOD REACTION Has patient had a PCN reaction causing immediate rash, facial/tongue/throat swelling, SOB or lightheadedness with hypotension: #  #  NO  #  #  Has patient had a PCN reaction occurring within the last 10 years:#  #  #  YES  #  #  #   If all of the above answers are NO, then may proceed with Cephalosporin use.   Tape Itching and Rash    Skin turns red Bandaid   Penicillamine Rash    Assessment/Plan:  1. Hyperlipidemia -  Hyperlipidemia Assessment: - LDL-C from May 2025 of 112 mg/dL remains above goal of < 70 mg/dL - Calcium  scoring completed in October 2025 placed her in the 86th percentile for her age - Currently on ezetimibe  monotherapy and is intolerant to statins - Discussed Repatha injection technique,  adverse effects, dosing  Plan: - Continue ezetimibe  therapy - Start the insurance approval process for Repatha and recheck labs in 3 months   Thank  you,  Savannah Salam, PharmD PGY2 Cardiology Pharmacy Resident  Skippy Marhefka D Khristopher Kapaun, Pharm.JONETTA SARAN, CPP [facility] A Division of [facility] [address], [place], [place] [address]  Phone: [phone]; Fax: [phone]

## 2024-10-02 NOTE — Patient Instructions (Signed)
 Continue your current ezetimibe  therapy. We will send information about Repatha to your insurance and reach out soon with more information.  Avoiding full-fat dairy, red meats, and food with high cholesterol can be helpful in managing your LDL or bad cholesterol. Continue to eat vegetables, fruits, and whole grains when able.

## 2024-10-03 ENCOUNTER — Other Ambulatory Visit: Payer: Self-pay | Admitting: Cardiology

## 2024-10-03 ENCOUNTER — Telehealth: Payer: Self-pay | Admitting: Pharmacy Technician

## 2024-10-03 ENCOUNTER — Other Ambulatory Visit (HOSPITAL_COMMUNITY): Payer: Self-pay

## 2024-10-03 DIAGNOSIS — E785 Hyperlipidemia, unspecified: Secondary | ICD-10-CM

## 2024-10-03 NOTE — Telephone Encounter (Signed)
 Pharmacy Patient Advocate Encounter   Received notification from Physician's Office that prior authorization for Repatha is required/requested.   Insurance verification completed.   The patient is insured through [employer].   Per test claim: PA required; PA submitted to above mentioned insurance via Latent Key/confirmation #/EOC [id number] Status is pending

## 2024-10-04 ENCOUNTER — Other Ambulatory Visit (HOSPITAL_COMMUNITY): Payer: Self-pay

## 2024-10-04 ENCOUNTER — Other Ambulatory Visit: Payer: Self-pay | Admitting: Cardiology

## 2024-10-04 NOTE — Telephone Encounter (Signed)
 Pharmacy Patient Advocate Encounter  Received notification from [employer] that Prior Authorization for repatha has been APPROVED from 12/01/23 to 11/29/25. Ran test claim, Copay is $47.00- one month. This test claim was processed through [facility]- copay amounts may vary at other pharmacies due to pharmacy/plan contracts, or as the patient moves through the different stages of their insurance plan.   PA #/Case ID/Reference #: [id number]

## 2024-10-05 MED ORDER — OMEGA-3-ACID ETHYL ESTERS 1 G PO CAPS: 1.0000 g | ORAL_CAPSULE | Freq: Every day | ORAL | 3 refills | Status: AC

## 2024-10-06 ENCOUNTER — Other Ambulatory Visit: Payer: Self-pay

## 2024-10-06 ENCOUNTER — Other Ambulatory Visit (HOSPITAL_COMMUNITY): Payer: Self-pay

## 2024-10-06 ENCOUNTER — Encounter: Payer: Self-pay | Admitting: Pharmacist

## 2024-10-06 MED ORDER — REPATHA SURECLICK 140 MG/ML ~~LOC~~ SOAJ
1.0000 mL | SUBCUTANEOUS | 3 refills | Status: AC
  Filled 2024-10-06: qty 6, 84d supply, fill #0
  Filled 2024-12-18: qty 6, 84d supply, fill #1

## 2024-10-06 NOTE — Addendum Note (Signed)
 Addended by: Barkley Kratochvil D on: 10/06/2024 10:05 AM   Modules accepted: Orders

## 2024-10-06 NOTE — Telephone Encounter (Signed)
 Called and spoke with patient.  Made aware that Repatha was approved.  She would like it delivered will set up with Darryle Law.  She is okay with the cost.  Repeat labs in 3 months.  For now we will continue the Zetia  and omega-3 but I suspect that we can stop after repeat labs.  Most recent guidelines actually recommend against the use of DHA EPA and her triglycerides are not about elevated.

## 2024-11-07 ENCOUNTER — Ambulatory Visit (HOSPITAL_COMMUNITY)

## 2024-12-04 ENCOUNTER — Ambulatory Visit: Admitting: Cardiology

## 2024-12-14 LAB — LIPID PANEL
Chol/HDL Ratio: 1.8 ratio (ref 0.0–4.4)
Cholesterol, Total: 135 mg/dL (ref 100–199)
HDL: 76 mg/dL
LDL Chol Calc (NIH): 40 mg/dL (ref 0–99)
Triglycerides: 105 mg/dL (ref 0–149)
VLDL Cholesterol Cal: 19 mg/dL (ref 5–40)

## 2024-12-18 ENCOUNTER — Other Ambulatory Visit: Payer: Self-pay

## 2024-12-18 ENCOUNTER — Other Ambulatory Visit (HOSPITAL_COMMUNITY): Payer: Self-pay

## 2024-12-18 ENCOUNTER — Ambulatory Visit: Payer: Self-pay | Admitting: Pharmacist

## 2024-12-20 ENCOUNTER — Other Ambulatory Visit (HOSPITAL_COMMUNITY): Payer: Self-pay

## 2024-12-20 ENCOUNTER — Ambulatory Visit: Admitting: Podiatry

## 2024-12-20 DIAGNOSIS — L84 Corns and callosities: Secondary | ICD-10-CM | POA: Diagnosis not present

## 2024-12-20 NOTE — Progress Notes (Signed)
 "  Subjective:  Patient ID: Brooke Williams, female    DOB: [birth year],  MRN: [id number]  Chief Complaint  Patient presents with   Toe Pain    Left 5th toe is sore. Has a callus 5th medial that she treated with OTC acid pad and became red and swollen.  PCP prescribed antibiotic that she has finished, but toe is still red and sore. Not diabetic. No  anti coag.    78 y.o. female presents with the above complaint.  Patient presents with left leg.  Patient states painful touch is progressing and worsening are really red and swollen she put wart remover cream on it which may have led to it.  She is not diabetic not on anticoagulation she was given antibiotic by her PCP denies any other acute complaints.  Pain scale 5 out of 10 dull aching nature worse hide toebox shoes   Review of Systems: Negative except as noted in the HPI. Denies N/V/F/Ch.  Past Medical History:  Diagnosis Date   Arthritis    arthritis-hands. cervical disc degeneration   Back pain    6th vertebrae has collapse on 7th,pinched nerve feeling occ   Dizziness    rarely,takes Meclizine if needed   Endometrial cancer (HCC) 2014   GERD (gastroesophageal reflux disease)    controls with Zantac   Headache(784.0)    headaches frequently but states since placed on 2L/m via N/C headaches have improved   History of colon polyps    benign   History of staph infection    Hyperlipidemia    takes krill oil daily   Hypothyroidism    takes Synthroid  daily   Ileus (HCC) 10/2015   was treated   Joint pain    Joint swelling    Lupus    remission since 2008   Nocturia    OSA (obstructive sleep apnea)    sleep study in epic from 11-01-15.Uses Oxygen   Pneumonia    hx of-within the last 15 yrs   PONV (postoperative nausea and vomiting)    Restless leg syndrome    takes Mirapex  nightly   Seasonal allergies    takes Allegra daily and uses Astelin  Nasal Spray as needed   Current Medications[1]  Tobacco Use  History[2]  Allergies[3] Objective:  There were no vitals filed for this visit. There is no height or weight on file to calculate BMI. Constitutional Well developed. Well nourished.  Vascular Dorsalis pedis pulses palpable bilaterally. Posterior tibial pulses palpable bilaterally. Capillary refill normal to all digits.  No cyanosis or clubbing noted. Pedal hair growth normal.  Neurologic Normal speech. Oriented to person, place, and time. Epicritic sensation to light touch grossly present bilaterally.  Dermatologic Left 4th and 5th digit has normal light no open wounds or lesion noted.  No further redness noted no clinical signs of infection noted.  Hammertoe contracture fifth digit noted.  Orthopedic: Normal joint ROM without pain or crepitus bilaterally. No visible deformities. No bony tenderness.   Radiographs: None Assessment:  No diagnosis found. Plan:  Patient was evaluated and treated and all questions answered.  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  Toe spacers were dispensed. - If there is no improvement we will discuss surgical correction in the future she states understanding.  No follow-ups on file.    [1]  Current Outpatient Medications:    Alpha-Lipoic Acid 600 MG CAPS, Take by mouth daily., Disp: , Rfl:    aspirin  EC 81 MG tablet, Take 1 tablet (81 mg total) by mouth daily. Swallow whole., Disp: 90 tablet, Rfl: 3   CALCIUM  PO, Take 743 mg by mouth daily., Disp: , Rfl:    doxycycline  (VIBRA -TABS) 100 MG tablet, Take 1 tablet by mouth daily. (Patient taking differently: Take 50 mg by mouth 2 (two) times daily.), Disp: , Rfl:    Evolocumab  (REPATHA  SURECLICK) 140 MG/ML SOAJ, Inject 140 mg into the skin every 14 (fourteen) days. Please deliver, Disp: 6  mL, Rfl: 3   ezetimibe  (ZETIA ) 10 MG tablet, Take 1 tablet (10 mg total) by mouth daily., Disp: 90 tablet, Rfl: 3   famotidine  (PEPCID ) 20 MG tablet, Take 20 mg by mouth as needed for heartburn or indigestion., Disp: , Rfl:    fluocinonide gel (LIDEX) 0.05 %, APPLY TO AFFECTED AREA twice a day as needed for canker sores., Disp: , Rfl: 1   fluticasone (FLONASE) 50 MCG/ACT nasal spray, Place into both nostrils 2 (two) times daily., Disp: , Rfl:    loratadine -pseudoephedrine  (CLARITIN -D 12-HOUR) 5-120 MG tablet, Take 1 tablet by mouth daily as needed for allergies., Disp: , Rfl:    Multiple Vitamin (MULTIVITAMIN WITH MINERALS) TABS, Take 1 tablet by mouth daily., Disp: , Rfl:    omega-3 acid ethyl esters (LOVAZA ) 1 g capsule, Take 1 capsule (1 g total) by mouth daily., Disp: 90 capsule, Rfl: 3   Psyllium (METAMUCIL FIBER PO), Take 5 capsules by mouth daily. , Disp: , Rfl:    SYNTHROID  75 MCG tablet, Take 75 mcg by mouth daily before breakfast. , Disp: , Rfl:    triamcinolone  (KENALOG ) 0.1 %, Apply topically., Disp: , Rfl:    UNABLE TO FIND, Med Name: Turkey Tail Mushroom Capsule, Disp: , Rfl:  [2]  Social History Tobacco Use  Smoking Status Never  Smokeless Tobacco Never  [3]  Allergies Allergen Reactions   Cefdinir Hives, Itching and Rash   Penicillins Hives    CHILDHOOD REACTION Has patient had a PCN reaction causing immediate rash, facial/tongue/throat swelling, SOB or lightheadedness with hypotension: #  #  NO  #  #  Has patient had a PCN reaction occurring within the last 10 years:#  #  #  YES  #  #  #   If all of the above answers are NO, then may proceed with Cephalosporin use.   Tape Itching and Rash    Skin turns red Bandaid   Penicillamine Rash   "
